# Patient Record
Sex: Female | Born: 1950 | Race: Black or African American | Hispanic: No | Smoking: Former smoker
Health system: Southern US, Community
[De-identification: ages and names within clinical notes are randomized; demographics above are authoritative.]

## PROBLEM LIST (undated history)

## (undated) DIAGNOSIS — M1990 Unspecified osteoarthritis, unspecified site: Secondary | ICD-10-CM

## (undated) DIAGNOSIS — I10 Essential (primary) hypertension: Secondary | ICD-10-CM

## (undated) DIAGNOSIS — E785 Hyperlipidemia, unspecified: Secondary | ICD-10-CM

## (undated) DIAGNOSIS — H269 Unspecified cataract: Secondary | ICD-10-CM

## (undated) DIAGNOSIS — J45909 Unspecified asthma, uncomplicated: Secondary | ICD-10-CM

## (undated) DIAGNOSIS — L910 Hypertrophic scar: Secondary | ICD-10-CM

## (undated) HISTORY — PX: MULTIPLE TOOTH EXTRACTIONS: SHX2053

## (undated) HISTORY — DX: Essential (primary) hypertension: I10

## (undated) HISTORY — DX: Hypertrophic scar: L91.0

## (undated) HISTORY — DX: Hyperlipidemia, unspecified: E78.5

## (undated) HISTORY — PX: BREAST BIOPSY: SHX20

## (undated) HISTORY — PX: ABDOMINAL HYSTERECTOMY: SHX81

## (undated) HISTORY — DX: Unspecified cataract: H26.9

## (undated) HISTORY — PX: KELOID EXCISION: SHX1856

---

## 2005-09-09 ENCOUNTER — Ambulatory Visit: Payer: Self-pay

## 2005-11-12 ENCOUNTER — Emergency Department: Payer: Self-pay | Admitting: Unknown Physician Specialty

## 2009-04-23 ENCOUNTER — Ambulatory Visit: Payer: Self-pay | Admitting: Gastroenterology

## 2009-04-23 LAB — HM COLONOSCOPY: HM COLON: NORMAL

## 2012-02-14 ENCOUNTER — Emergency Department: Payer: Self-pay | Admitting: Emergency Medicine

## 2015-04-08 ENCOUNTER — Encounter: Payer: Self-pay | Admitting: Emergency Medicine

## 2015-04-08 ENCOUNTER — Emergency Department: Payer: Commercial Managed Care - HMO

## 2015-04-08 ENCOUNTER — Emergency Department
Admission: EM | Admit: 2015-04-08 | Discharge: 2015-04-08 | Disposition: A | Discharge status: Home / Self Care | Payer: Commercial Managed Care - HMO | Attending: Emergency Medicine | Admitting: Emergency Medicine

## 2015-04-08 DIAGNOSIS — Y9301 Activity, walking, marching and hiking: Secondary | ICD-10-CM | POA: Insufficient documentation

## 2015-04-08 DIAGNOSIS — X58XXXA Exposure to other specified factors, initial encounter: Secondary | ICD-10-CM | POA: Insufficient documentation

## 2015-04-08 DIAGNOSIS — Z87891 Personal history of nicotine dependence: Secondary | ICD-10-CM | POA: Diagnosis not present

## 2015-04-08 DIAGNOSIS — M7731 Calcaneal spur, right foot: Secondary | ICD-10-CM | POA: Insufficient documentation

## 2015-04-08 DIAGNOSIS — Y998 Other external cause status: Secondary | ICD-10-CM | POA: Diagnosis not present

## 2015-04-08 DIAGNOSIS — Y9289 Other specified places as the place of occurrence of the external cause: Secondary | ICD-10-CM | POA: Insufficient documentation

## 2015-04-08 DIAGNOSIS — S99911A Unspecified injury of right ankle, initial encounter: Secondary | ICD-10-CM | POA: Diagnosis present

## 2015-04-08 DIAGNOSIS — M7730 Calcaneal spur, unspecified foot: Secondary | ICD-10-CM

## 2015-04-08 DIAGNOSIS — M25571 Pain in right ankle and joints of right foot: Secondary | ICD-10-CM | POA: Diagnosis not present

## 2015-04-08 MED ORDER — HYDROCODONE-ACETAMINOPHEN 5-325 MG PO TABS: 1.0000 | ORAL_TABLET | ORAL | Status: DC | PRN

## 2015-04-08 MED ORDER — IBUPROFEN 800 MG PO TABS: 800.0000 mg | ORAL_TABLET | Freq: Three times a day (TID) | ORAL | Status: DC | PRN

## 2015-04-08 NOTE — ED Notes (Signed)
Pt states about a month ago she was walking in [employer] and all the sudden she started having this burning pain states she didn't roll her ankle or anything it "just started to hurt" pt states as long as she is off the foot she doesn't have pain but [place] and it caused the pain to get worse. Pt has slight swelling to in the inside of her right foot and has pain with palp and flexion.

## 2015-04-08 NOTE — Discharge Instructions (Signed)
Heel Spur  A heel spur is a bony growth that forms on the bottom of your heel bone (calcaneus). Heel spurs are common and do not always cause pain. However, heel spurs often cause inflammation in the strong band of tissue that runs underneath the bone of your foot (plantar fascia). When this happens, you may feel pain on the bottom of your foot, near your heel.   CAUSES   The cause of heel spurs is not completely understood. They may be caused by pressure on the heel. Or, they may stem from the muscle attachments (tendons) near the spur pulling on the heel.   RISK FACTORS  You may be at risk for a heel spur if you:  · Are older than 40.  · Are overweight.  · Have wear and tear arthritis (osteoarthritis).  · Have plantar fascia inflammation.  SIGNS AND SYMPTOMS   Some people have heel spurs but no symptoms. If you do have symptoms, they may include:   · Pain in the bottom of your heel.  · Pain that is worse when you first get out of bed.  · Pain that gets worse after walking or standing.  DIAGNOSIS   Your health care provider may diagnose a heel spur based on your symptoms and a physical exam. You may also have an X-ray of your foot to check for a bony growth coming from the calcaneus.   TREATMENT  Treatment aims to relieve the pain from the heel spur. This may include:  · Stretching exercises.  · Losing weight.  · Wearing specific shoes, inserts, or orthotics for comfort and support.  · Wearing splints at night to properly position your feet.  · Taking over-the-counter medicine to relieve pain.  · Being treated with high-intensity sound waves to break up the heel spur (extracorporeal shock wave therapy).  · Getting steroid injections in your heel to reduce swelling and ease pain.  · Having surgery if your heel spur causes long-term (chronic) pain.  HOME CARE INSTRUCTIONS   · Take medicines only as directed by your health care provider.  · Ask your health care provider if you should use ice or cold packs on the  painful areas of your heel or foot.  · Avoid activities that cause you pain until you recover or as directed by your health care provider.  · Stretch before exercising or being physically active.  · Wear supportive shoes that fit well as directed by your health care provider. You might need to buy new shoes. Wearing old shoes or shoes that do not fit correctly may not provide the support that you need.  · Lose weight if your health care provider thinks you should. This can relieve pressure on your foot that may be causing pain and discomfort.  SEEK MEDICAL CARE IF:   · Your pain continues or gets worse.     This information is not intended to replace advice given to you by your health care provider. Make sure you discuss any questions you have with your health care provider.     Document Released: 07/09/2005 Document Revised: 06/23/2014 Document Reviewed: 08/03/2013  Elsevier Interactive Patient Education ©2016 Else[employer] Inc[employer]

## 2015-04-08 NOTE — ED Notes (Signed)
Pt states was walking in [employer] with sudden onset R ankle pain approx 1 mo ago, states [place] and pain has increased.

## 2015-04-08 NOTE — ED Provider Notes (Signed)
[facility] Emergency Department Provider Note  ____________________________________________  Time seen: Approximately 11:29 AM  I have reviewed the triage vital signs and the nursing notes.   HISTORY  Chief Complaint Ankle Injury    HPI Jessica Brooks is a 64 y.o. female who presents for evaluation of right ankle pain 1 month. She reports sudden onset 1 month ago when walking or more. Then exacerbated [place]. Patient denies any trauma. Patient states the pain is. When she is off her foot but increases and exacerbates when walking. Denies any specific trauma.   History reviewed. No pertinent past medical history.  There are no active problems to display for this patient.   Past Surgical History  Procedure Laterality Date  . Abdominal hysterectomy      Current Outpatient Rx  Name  Route  Sig  Dispense  Refill  . HYDROcodone-acetaminophen (NORCO) 5-325 MG tablet   Oral   Take 1-2 tablets by mouth every 4 (four) hours as needed for moderate pain.   15 tablet   0   . ibuprofen (ADVIL,MOTRIN) 800 MG tablet   Oral   Take 1 tablet (800 mg total) by mouth every 8 (eight) hours as needed.   30 tablet   0     Allergies Review of patient's allergies indicates no known allergies.  History reviewed. No pertinent family history.  Social History Social History  Substance Use Topics  . Smoking status: Former Games developermoker  . Smokeless tobacco: Never Used  . Alcohol Use: No    Review of Systems Constitutional: No fever/chills Eyes: No visual changes. ENT: No sore throat. Cardiovascular: Denies chest pain. Respiratory: Denies shortness of breath. Gastrointestinal: No abdominal pain.  No nausea, no vomiting.  No diarrhea.  No constipation. Genitourinary: Negative for dysuria. Musculoskeletal: Positive for right ankle pain. Skin: Negative for rash. Neurological: Negative for headaches, focal weakness or  numbness.  10-point ROS otherwise negative.  ____________________________________________   PHYSICAL EXAM:  VITAL SIGNS: ED Triage Vitals  Enc Vitals Group     BP 04/08/15 1123 143/76 mmHg     Pulse Rate 04/08/15 1123 101     Resp 04/08/15 1123 18     Temp 04/08/15 1123 98 F (36.7 C)     Temp Source 04/08/15 1123 Oral     SpO2 04/08/15 1123 95 %     Weight 04/08/15 1123 280 lb (127.007 kg)     Height 04/08/15 1123 5\' 5"  (1.651 m)     Head Cir --      Peak Flow --      Pain Score 04/08/15 1123 7     Pain Loc --      Pain Edu? --      Excl. in GC? --     Constitutional: Alert and oriented. Well appearing and in no acute distress.  Cardiovascular: Normal rate, regular rhythm. Grossly normal heart sounds.  Good peripheral circulation. Respiratory: Normal respiratory effort.  No retractions. Lungs CTAB. Musculoskeletal: Tender right lower ankle and the lateral aspect.  No joint effusions. Distally neurovascularly intact. With full range of motion. Neurologic:  Normal speech and language. No gross focal neurologic deficits are appreciated. No gait instability. Skin:  Skin is warm, dry and intact. No rash noted. Psychiatric: Mood and affect are normal. Speech and behavior are normal.  ____________________________________________   LABS (all labs ordered are listed, but only abnormal results are displayed)  Labs Reviewed - No data to display ____________________________________________  RADIOLOGY  Positive for plantar and Achilles bone spurs. ____________________________________________   PROCEDURES  Procedure(s) performed: None  Critical Care performed: No  ____________________________________________   INITIAL IMPRESSION / ASSESSMENT AND PLAN / ED COURSE  Pertinent labs & imaging results that were available during my care of the patient were reviewed by me and considered in my medical decision making (see chart for details).  Plantar and Achilles bone spurs.  Rx given for Motrin 800 mg 3 times a day encouraged follow-up with podiatry on call. She voices no other emergency medical complaints at this time. ____________________________________________   FINAL CLINICAL IMPRESSION(S) / ED DIAGNOSES  Final diagnoses:  Inferior calcaneal bone spur    Call schedule  Evangeline Dakin, PA-C 04/08/15 1305  Jennye Moccasin, MD 04/08/15 [phone]

## 2015-08-01 ENCOUNTER — Emergency Department
Admission: EM | Admit: 2015-08-01 | Discharge: 2015-08-01 | Disposition: A | Discharge status: Home / Self Care | Payer: Commercial Managed Care - HMO | Attending: Emergency Medicine | Admitting: Emergency Medicine

## 2015-08-01 ENCOUNTER — Encounter: Payer: Self-pay | Admitting: Emergency Medicine

## 2015-08-01 DIAGNOSIS — Z87891 Personal history of nicotine dependence: Secondary | ICD-10-CM | POA: Diagnosis not present

## 2015-08-01 DIAGNOSIS — R0981 Nasal congestion: Secondary | ICD-10-CM | POA: Diagnosis present

## 2015-08-01 DIAGNOSIS — J069 Acute upper respiratory infection, unspecified: Secondary | ICD-10-CM

## 2015-08-01 MED ORDER — BENZONATATE 100 MG PO CAPS: 200.0000 mg | ORAL_CAPSULE | Freq: Three times a day (TID) | ORAL | Status: DC | PRN

## 2015-08-01 MED ORDER — LORATADINE 10 MG PO TABS: 10.0000 mg | ORAL_TABLET | Freq: Every day | ORAL | Status: DC

## 2015-08-01 MED ORDER — ALBUTEROL SULFATE HFA 108 (90 BASE) MCG/ACT IN AERS: 2.0000 | INHALATION_SPRAY | Freq: Four times a day (QID) | RESPIRATORY_TRACT | Status: DC | PRN

## 2015-08-01 NOTE — ED Provider Notes (Signed)
[facility] Emergency Department Provider Note  ____________________________________________  Time seen: Approximately 11:20 AM  I have reviewed the triage vital signs and the nursing notes.   HISTORY  Chief Complaint Nasal Congestion   HPI Jessica Brooks is a 65 y.o. female to complain of productive cough, runny nose and sneezing for 3 days. Patient states she has not had any fever. She states that last week she had similar symptoms and that it "got better". Now for the last 3 days she has had same symptoms. She has been taking over-the-counter medication with minimal relief. She's been taking Alka-Seltzer, Sudafed, and Robitussin. Patient was former smoker and in the past has had to use an inhaler. She states that she is wheezing at night and first thing in the morning. She has not had an inhaler for the last 2 years. She denies any pain at this time.  History reviewed. No pertinent past medical history.  There are no active problems to display for this patient.   Past Surgical History  Procedure Laterality Date  . Abdominal hysterectomy      Current Outpatient Rx  Name  Route  Sig  Dispense  Refill  . albuterol (PROVENTIL HFA;VENTOLIN HFA) 108 (90 Base) MCG/ACT inhaler   Inhalation   Inhale 2 puffs into the lungs every 6 (six) hours as needed for wheezing or shortness of breath.   1 Inhaler   2   . benzonatate (TESSALON PERLES) 100 MG capsule   Oral   Take 2 capsules (200 mg total) by mouth 3 (three) times daily as needed for cough.   40 capsule   0   . HYDROcodone-acetaminophen (NORCO) 5-325 MG tablet   Oral   Take 1-2 tablets by mouth every 4 (four) hours as needed for moderate pain.   15 tablet   0   . ibuprofen (ADVIL,MOTRIN) 800 MG tablet   Oral   Take 1 tablet (800 mg total) by mouth every 8 (eight) hours as needed.   30 tablet   0   . loratadine (CLARITIN) 10 MG tablet   Oral   Take 1 tablet (10 mg total) by mouth daily.   30 tablet   2     Allergies Review of patient's allergies indicates no known allergies.  History reviewed. No pertinent family history.  Social History Social History  Substance Use Topics  . Smoking status: Former Games developer  . Smokeless tobacco: Never Used  . Alcohol Use: No    Review of Systems Constitutional: No fever/chills Eyes: No visual changes. ENT: Positive nasal congestion. Positive rhinorrhea Cardiovascular: Denies chest pain. Respiratory: Denies shortness of breath. Positive productive cough. Gastrointestinal:  No nausea, no vomiting.  Musculoskeletal: Negative for back pain. Skin: Negative for rash. Neurological: Negative for headaches, focal weakness or numbness. 10-point ROS otherwise negative.  ____________________________________________   PHYSICAL EXAM:  VITAL SIGNS: ED Triage Vitals  Enc Vitals Group     BP 08/01/15 1029 167/79 mmHg     Pulse Rate 08/01/15 1029 102     Resp 08/01/15 1029 20     Temp 08/01/15 1029 98.2 F (36.8 C)     Temp Source 08/01/15 1029 Oral     SpO2 08/01/15 1029 98 %     Weight 08/01/15 1029 200 lb (90.719 kg)     Height 08/01/15 1029  (1.651 m)     Head Cir --      Peak Flow --      Pain Score --  Pain Loc --      Pain Edu? --      Excl. in GC? --     Constitutional: Alert and oriented. Well appearing and in no acute distress. Eyes: Conjunctivae are normal. PERRL. EOMI. Head: Atraumatic. Nose: Moderate congestion/rhinnorhea. EACs are partially occluded with cerumen. TMs bilaterally are dull without erythema. Mouth/Throat: Mucous membranes are moist.  Oropharynx non-erythematous. Posterior drainage noted. Neck: No stridor.   Hematological/Lymphatic/Immunilogical: No cervical lymphadenopathy. Cardiovascular: Normal rate, regular rhythm. Grossly normal heart sounds.  Good peripheral circulation. Respiratory: Normal respiratory effort.  No retractions. Lungs CTAB. Gastrointestinal: Soft and nontender. No  distention.  Musculoskeletal: No lower extremity tenderness nor edema.  No joint effusions. Neurologic:  Normal speech and language. No gross focal neurologic deficits are appreciated. No gait instability. Skin:  Skin is warm, dry and intact. No rash noted. Psychiatric: Mood and affect are normal. Speech and behavior are normal.  ____________________________________________   LABS (all labs ordered are listed, but only abnormal results are displayed)  Labs Reviewed - No data to display  PROCEDURES  Procedure(s) performed: None  Critical Care performed: No  ____________________________________________   INITIAL IMPRESSION / ASSESSMENT AND PLAN / ED COURSE  Pertinent labs & imaging results that were available during my care of the patient were reviewed by me and considered in my medical decision making (see chart for details).  Patient is follow-up with Dr. Christell Faith office if any continued problems. She is placed on Proventil inhaler as needed for wheezing, Tessalon Perles for coughing and Claritin 10 mg one daily for allergy symptoms. She is to increase fluids. Tylenol or ibuprofen if needed. ____________________________________________   FINAL CLINICAL IMPRESSION(S) / ED DIAGNOSES  Final diagnoses:  Acute upper respiratory infection      Tommi Rumps, PA-C 08/01/15 1136  Emily Filbert, MD 08/01/15 1255

## 2015-08-01 NOTE — ED Notes (Signed)
C/o cough and runny nose and sneezing for 3 days.  Denies fevers. C/o nasal congestion

## 2015-08-01 NOTE — Discharge Instructions (Signed)
Upper Respiratory Infection, Adult Most upper respiratory infections (URIs) are caused by a virus. A URI affects the nose, throat, and upper air passages. The most common type of URI is often called "the common cold." HOME CARE   Take medicines only as told by your doctor.  Gargle warm saltwater or take cough drops to comfort your throat as told by your doctor.  Use a warm mist humidifier or inhale steam from a shower to increase air moisture. This may make it easier to breathe.  Drink enough fluid to keep your pee (urine) clear or pale yellow.  Eat soups and other clear broths.  Have a healthy diet.  Rest as needed.  Go back to work when your fever is gone or your doctor says it is okay.  You may need to stay home longer to avoid giving your URI to others.  You can also wear a face mask and wash your hands often to prevent spread of the virus.  Use your inhaler more if you have asthma.  Do not use any tobacco products, including cigarettes, chewing tobacco, or electronic cigarettes. If you need help quitting, ask your doctor. GET HELP IF:  You are getting worse, not better.  Your symptoms are not helped by medicine.  You have chills.  You are getting more short of breath.  You have brown or red mucus.  You have yellow or brown discharge from your nose.  You have pain in your face, especially when you bend forward.  You have a fever.  You have puffy (swollen) neck glands.  You have pain while swallowing.  You have white areas in the back of your throat. GET HELP RIGHT AWAY IF:   You have very bad or constant:  Headache.  Ear pain.  Pain in your forehead, behind your eyes, and over your cheekbones (sinus pain).  Chest pain.  You have long-lasting (chronic) lung disease and any of the following:  Wheezing.  Long-lasting cough.  Coughing up blood.  A change in your usual mucus.  You have a stiff neck.  You have changes in  your:  Vision.  Hearing.  Thinking.  Mood. MAKE SURE YOU:   Understand these instructions.  Will watch your condition.  Will get help right away if you are not doing well or get worse.   This information is not intended to replace advice given to you by your health care provider. Make sure you discuss any questions you have with your health care provider.   Document Released: 11/19/2007 Document Revised: 10/17/2014 Document Reviewed: 09/07/2013 Elsevier Interactive Patient Education [employer]   Follow-up with Dr. Christell Faith office if any continued problems. Begin taking Loratadine one a day, tessalon perles for cough and Proventil inhaler 2 puffs 4 times a day when necessary wheezing. Tylenol if needed for fever or aches. Increase fluids.

## 2015-11-20 ENCOUNTER — Encounter: Payer: Self-pay | Admitting: Unknown Physician Specialty

## 2015-11-20 ENCOUNTER — Ambulatory Visit (INDEPENDENT_AMBULATORY_CARE_PROVIDER_SITE_OTHER): Payer: Commercial Managed Care - HMO | Admitting: Unknown Physician Specialty

## 2015-11-20 VITALS — BP 126/85 | HR 90 | Temp 98.6°F | Ht 65.25 in | Wt 248.8 lb

## 2015-11-20 DIAGNOSIS — Z1231 Encounter for screening mammogram for malignant neoplasm of breast: Secondary | ICD-10-CM

## 2015-11-20 DIAGNOSIS — E2839 Other primary ovarian failure: Secondary | ICD-10-CM

## 2015-11-20 DIAGNOSIS — M76829 Posterior tibial tendinitis, unspecified leg: Secondary | ICD-10-CM | POA: Insufficient documentation

## 2015-11-20 DIAGNOSIS — M76821 Posterior tibial tendinitis, right leg: Secondary | ICD-10-CM

## 2015-11-20 DIAGNOSIS — E669 Obesity, unspecified: Secondary | ICD-10-CM

## 2015-11-20 DIAGNOSIS — J45909 Unspecified asthma, uncomplicated: Secondary | ICD-10-CM | POA: Insufficient documentation

## 2015-11-20 DIAGNOSIS — J4520 Mild intermittent asthma, uncomplicated: Secondary | ICD-10-CM | POA: Diagnosis not present

## 2015-11-20 DIAGNOSIS — Z23 Encounter for immunization: Secondary | ICD-10-CM | POA: Diagnosis not present

## 2015-11-20 DIAGNOSIS — L910 Hypertrophic scar: Secondary | ICD-10-CM | POA: Diagnosis not present

## 2015-11-20 MED ORDER — MUPIROCIN CALCIUM 2 % EX CREA: 1.0000 "application " | TOPICAL_CREAM | Freq: Two times a day (BID) | CUTANEOUS | Status: DC

## 2015-11-20 NOTE — Patient Instructions (Signed)
Please do call to schedule your mammogram and bone density; the number to schedule one at either [facility] or [facility] is [phone]   

## 2015-11-20 NOTE — Progress Notes (Signed)
BP 126/85 mmHg  Pulse 90  Temp(Src) 98.6 F (37 C)  Ht 5' 5.25" (1.657 m)  Wt 248 lb 12.8 oz (112.855 kg)  BMI 41.10 kg/m2  SpO2 96%   Subjective:    Patient ID: Jessica Brooks, female    DOB: Oct 22, 1950, 65 y.o.   MRN: [id number]  HPI: Jessica Brooks is a 65 y.o. female  Chief Complaint  Patient presents with  . Establish Care  . Ankle Pain    Excercising and patient hurt her ankle  . Keloids    Itching  . Other    Needs mammogram and wants shingles vaccine.    Social History   Social History  . Marital Status: Married    Spouse Name: N/A  . Number of Children: N/A  . Years of Education: N/A   Occupational History  . Not on file.   Social History Main Topics  . Smoking status: Former Smoker    Quit date: 06/16/1978  . Smokeless tobacco: Never Used  . Alcohol Use: No  . Drug Use: No  . Sexual Activity: Not on file   Other Topics Concern  . Not on file   Social History Narrative   Past Medical History  Diagnosis Date  . Keloid    Past Surgical History  Procedure Laterality Date  . Abdominal hysterectomy     Family History  Problem Relation Age of Onset  . Diabetes Mother   . Hypertension Mother   . Cancer Father     throat  . Diabetes Sister   . Diabetes Brother     Asthma Has an inhaler that she uses rarely spring and fall.  Uses Loratadine QD.    Keloid Large chest keloid.  States it itches, she itches, scratches it, and it starts draining.   Ankle pain Right medial ankle pain that started when increased exercises.  She started having problems with medial ankle.  Went to the ER and told she had a bone spur.  She states it gets worse the more she walks.    Depression screen [facility] 2/9 11/20/2015 11/20/2015  Decreased Interest 1 1  Down, Depressed, Hopeless 0 0  PHQ - 2 Score 1 1     Relevant past medical, surgical, family and social history reviewed and updated as indicated. Interim medical history since our last visit  reviewed. Allergies and medications reviewed and updated.  Review of Systems  Constitutional: Negative.   HENT: Negative.   Eyes: Negative.   Cardiovascular: Negative.   Gastrointestinal: Negative.   Endocrine: Negative.   Genitourinary: Negative.   Musculoskeletal: Negative.   Psychiatric/Behavioral: Negative.     Per HPI unless specifically indicated above     Objective:    BP 126/85 mmHg  Pulse 90  Temp(Src) 98.6 F (37 C)  Ht 5' 5.25" (1.657 m)  Wt 248 lb 12.8 oz (112.855 kg)  BMI 41.10 kg/m2  SpO2 96%  Wt Readings from Last 3 Encounters:  11/20/15 248 lb 12.8 oz (112.855 kg)  08/01/15 200 lb (90.719 kg)  04/08/15 280 lb (127.007 kg)    Physical Exam  Constitutional: She is oriented to person, place, and time. She appears well-developed and well-nourished. No distress.    HENT:  Head: Normocephalic and atraumatic.  Eyes: Conjunctivae and lids are normal. Right eye exhibits no discharge. Left eye exhibits no discharge. No scleral icterus.  Neck: Normal range of motion. Neck supple. No JVD present. Carotid bruit is not present.  Cardiovascular: Normal rate,  regular rhythm and normal heart sounds.   Pulmonary/Chest: Effort normal and breath sounds normal.  Abdominal: Normal appearance. There is no splenomegaly or hepatomegaly.  Musculoskeletal: Normal range of motion.       Right ankle: She exhibits swelling. She exhibits normal range of motion, no ecchymosis, no deformity, no laceration and normal pulse.       Feet:  Neurological: She is alert and oriented to person, place, and time.  Skin: Skin is warm, dry and intact. No rash noted. No pallor.  Large keloid chest.  There is some drainage at spots.    Psychiatric: She has a normal mood and affect. Her behavior is normal. Judgment and thought content normal.    Results for orders placed or performed in visit on 10/10/15  HM COLONOSCOPY  Result Value Ref Range   HM Colonoscopy Patient Reported Normal See  Report, Patient Reported Normal      Assessment & Plan:   Problem List Items Addressed This Visit      Unprioritized   Asthma    Stable, continue present medications.        Keloid    Bactroban cream.  Refer to dermatology for long-term management      Obesity    Wants to get back to diet.  She plans to give up sweet tea, lemonade.        Tibial tendonitis, posterior    Discussed arch supports.  Will consider PT and/or podiatry referral       Other Visit Diagnoses    Encounter for screening mammogram for breast cancer    -  Primary    Relevant Orders    MM DIGITAL SCREENING BILATERAL    hysterectomy        Relevant Orders    DG Bone Density    Need for shingles vaccine        Relevant Orders    Varicella-zoster vaccine subcutaneous (Completed)    Need for Tdap vaccination        Relevant Orders    Tdap vaccine greater than or equal to 7yo IM (Completed)        Follow up plan: Return for Medicare physical.

## 2015-11-20 NOTE — Assessment & Plan Note (Addendum)
Wants to get back to diet.  She plans to give up sweet tea, lemonade.

## 2015-11-20 NOTE — Assessment & Plan Note (Signed)
Stable, continue present medications.   

## 2015-11-20 NOTE — Assessment & Plan Note (Signed)
Bactroban cream.  Refer to dermatology for long-term management

## 2015-11-20 NOTE — Assessment & Plan Note (Signed)
Discussed arch supports.  Will consider PT and/or podiatry referral

## 2015-12-14 ENCOUNTER — Ambulatory Visit (INDEPENDENT_AMBULATORY_CARE_PROVIDER_SITE_OTHER): Payer: Commercial Managed Care - HMO | Admitting: Unknown Physician Specialty

## 2015-12-14 ENCOUNTER — Encounter: Payer: Self-pay | Admitting: Unknown Physician Specialty

## 2015-12-14 VITALS — BP 140/93 | HR 85 | Temp 98.3°F | Ht 64.3 in | Wt 247.0 lb

## 2015-12-14 DIAGNOSIS — E2839 Other primary ovarian failure: Secondary | ICD-10-CM | POA: Diagnosis not present

## 2015-12-14 DIAGNOSIS — Z0000 Encounter for general adult medical examination without abnormal findings: Secondary | ICD-10-CM | POA: Diagnosis not present

## 2015-12-14 DIAGNOSIS — Z780 Asymptomatic menopausal state: Secondary | ICD-10-CM | POA: Diagnosis not present

## 2015-12-14 DIAGNOSIS — L910 Hypertrophic scar: Secondary | ICD-10-CM

## 2015-12-14 DIAGNOSIS — I10 Essential (primary) hypertension: Secondary | ICD-10-CM | POA: Diagnosis not present

## 2015-12-14 DIAGNOSIS — Z23 Encounter for immunization: Secondary | ICD-10-CM

## 2015-12-14 DIAGNOSIS — Z1231 Encounter for screening mammogram for malignant neoplasm of breast: Secondary | ICD-10-CM

## 2015-12-14 LAB — MICROALBUMIN, URINE WAIVED
Creatinine, Urine Waived: 100 mg/dL (ref 10–300)
MICROALB, UR WAIVED: 30 mg/L — AB (ref 0–19)

## 2015-12-14 NOTE — Patient Instructions (Addendum)
Pneumococcal Conjugate Vaccine (PCV13)  1. Why get vaccinated? Vaccination can protect both children and adults from pneumococcal disease. Pneumococcal disease is caused by bacteria that can spread from person to person through close contact. It can cause ear infections, and it can also lead to more serious infections of the:  Lungs (pneumonia),  Blood (bacteremia), and  Covering of the brain and spinal cord (meningitis). Pneumococcal pneumonia is most common among adults. Pneumococcal meningitis can cause deafness and brain damage, and it kills about 1 child in 10 who get it. Anyone can get pneumococcal disease, but children under 28 years of age and adults 43 years and older, people with certain medical conditions, and cigarette smokers are at the highest risk. Before there was a vaccine, the [place] saw:  more than 700 cases of meningitis,  about 13,000 blood infections,  about 5 million ear infections, and  about 200 deaths in children under 5 each year from pneumococcal disease. Since vaccine became available, severe pneumococcal disease in these children has fallen by 88%. About 18,000 older adults die of pneumococcal disease each year in the [place]. Treatment of pneumococcal infections with penicillin and other drugs is not as effective as it used to be, because some strains of the disease have become resistant to these drugs. This makes prevention of the disease, through vaccination, even more important. 2. PCV13 vaccine Pneumococcal conjugate vaccine (called PCV13) protects against 13 types of pneumococcal bacteria. PCV13 is routinely given to children at 2, 4, 6, and 65-74 months of age. It is also recommended for children and adults 70 to 70 years of age with certain health conditions, and for all adults 64 years of age and older. Your doctor can give you details. 3. Some people should not get this vaccine Anyone who has ever had a life-threatening allergic reaction  to a dose of this vaccine, to an earlier pneumococcal vaccine called PCV7, or to any vaccine containing diphtheria toxoid (for example, DTaP), should not get PCV13. Anyone with a severe allergy to any component of PCV13 should not get the vaccine. Tell your doctor if the person being vaccinated has any severe allergies. If the person scheduled for vaccination is not feeling well, your healthcare provider might decide to reschedule the shot on another day. 4. Risks of a vaccine reaction With any medicine, including vaccines, there is a chance of reactions. These are usually mild and go away on their own, but serious reactions are also possible. Problems reported following PCV13 varied by age and dose in the series. The most common problems reported among children were:  About half became drowsy after the shot, had a temporary loss of appetite, or had redness or tenderness where the shot was given.  About 1 out of 3 had swelling where the shot was given.  About 1 out of 3 had a mild fever, and about 1 in 20 had a fever over 102.55F.  Up to about 8 out of 10 became fussy or irritable. Adults have reported pain, redness, and swelling where the shot was given; also mild fever, fatigue, headache, chills, or muscle pain. Young children who get PCV13 along with inactivated flu vaccine at the same time may be at increased risk for seizures caused by fever. Ask your doctor for more information. Problems that could happen after any vaccine:  People sometimes faint after a medical procedure, including vaccination. Sitting or lying down for about 15 minutes can help prevent fainting, and injuries caused by a fall.  Tell your doctor if you feel dizzy, or have vision changes or ringing in the ears.  Some older children and adults get severe pain in the shoulder and have difficulty moving the arm where a shot was given. This happens very rarely.  Any medication can cause a severe allergic reaction. Such  reactions from a vaccine are very rare, estimated at about 1 in a million doses, and would happen within a few minutes to a few hours after the vaccination. As with any medicine, there is a very small chance of a vaccine causing a serious injury or death. The safety of vaccines is always being monitored. For more information, visit: http://floyd.org/ 5. What if there is a serious reaction? What should I look for?  Look for anything that concerns you, such as signs of a severe allergic reaction, very high fever, or unusual behavior. Signs of a severe allergic reaction can include hives, swelling of the face and throat, difficulty breathing, a fast heartbeat, dizziness, and weakness-usually within a few minutes to a few hours after the vaccination. What should I do?  If you think it is a severe allergic reaction or other emergency that can't wait, call 9-1-1 or get the person to the nearest hospital. Otherwise, call your doctor. Reactions should be reported to the Vaccine Adverse Event Reporting System (VAERS). Your doctor should file this report, or you can do it yourself through the VAERS web site at www.vaers.LAgents.no, or by calling 1-202-701-4372. VAERS does not give medical advice. 6. The National Vaccine Injury Compensation Program The [employer] Vaccine Injury Compensation Program (VICP) is a federal program that was created to compensate people who may have been injured by certain vaccines. Persons who believe they may have been injured by a vaccine can learn about the program and about filing a claim by calling 1-919-065-0496 or visiting the VICP website at SpiritualWord.at. There is a time limit to file a claim for compensation. 7. How can I learn more?  Ask your healthcare provider. He or she can give you the vaccine package insert or suggest other sources of information.  Call your local or state health department.  Contact the Centers for Disease Control and  Prevention (CDC):  Call 435-357-6703 (1-800-CDC-INFO) or  Visit CDC's website at PicCapture.uy Vaccine Information Statement PCV13 Vaccine (04/20/2014)   This information is not intended to replace advice given to you by your health care provider. Make sure you discuss any questions you have with your health care provider.  ---------------------------------------------------------------------------------------------------------------------------- Please do call to schedule your mammogram; the number to schedule one at either [facility] or [facility] is [phone] ----------------------------------------------------------------------------------------------------------------------------  DASH Eating Plan DASH stands for "Dietary Approaches to Stop Hypertension." The DASH eating plan is a healthy eating plan that has been shown to reduce high blood pressure (hypertension). Additional health benefits may include reducing the risk of type 2 diabetes mellitus, heart disease, and stroke. The DASH eating plan may also help with weight loss. WHAT DO I NEED TO KNOW ABOUT THE DASH EATING PLAN? For the DASH eating plan, you will follow these general guidelines:  Choose foods with a percent daily value for sodium of less than 5% (as listed on the food label).  Use salt-free seasonings or herbs instead of table salt or sea salt.  Check with your health care provider or pharmacist before using salt substitutes.  Eat lower-sodium products, often labeled as "lower sodium" or "no salt added."  Eat fresh foods.  Eat more vegetables,  fruits, and low-fat dairy products.  Choose whole grains. Look for the word "whole" as the first word in the ingredient list.  Choose fish and skinless chicken or Mal[place]urkey more often than red meat. Limit fish, poultry, and meat to 6 oz (170 g) each day.  Limit sweets, desserts, sugars, and sugary drinks.  Choose heart-healthy  fats.  Limit cheese to 1 oz (28 g) per day.  Eat more home-cooked food and less restaurant, buffet, and fast food.  Limit fried foods.  Cook foods using methods other than frying.  Limit canned vegetables. If you do use them, rinse them well to decrease the sodium.  When eating at a restaurant, ask that your food be prepared with less salt, or no salt if possible. WHAT FOODS CAN I EAT? Seek help from a dietitian for individual calorie needs. Grains Whole grain or whole wheat bread. Brown rice. Whole grain or whole wheat pasta. Quinoa, bulgur, and whole grain cereals. Low-sodium cereals. Corn or whole wheat flour tortillas. Whole grain cornbread. Whole grain crackers. Low-sodium crackers. Vegetables Fresh or frozen vegetables (raw, steamed, roasted, or grilled). Low-sodium or reduced-sodium tomato and vegetable juices. Low-sodium or reduced-sodium tomato sauce and paste. Low-sodium or reduced-sodium canned vegetables.  Fruits All fresh, canned (in natural juice), or frozen fruits. Meat and Other Protein Products Ground beef (85% or leaner), grass-fed beef, or beef trimmed of fat. Skinless chicken or Mal[place]urkey. Ground chicken or Malawiturkey. Pork trimmed of fat. All fish and seafood. Eggs. Dried beans, peas, or lentils. Unsalted nuts and seeds. Unsalted canned beans. Dairy Low-fat dairy products, such as skim or 1% milk, 2% or reduced-fat cheeses, low-fat ricotta or cottage cheese, or plain low-fat yogurt. Low-sodium or reduced-sodium cheeses. Fats and Oils Tub margarines without trans fats. Light or reduced-fat mayonnaise and salad dressings (reduced sodium). Avocado. Safflower, olive, or canola oils. Natural peanut or almond butter. Other Unsalted popcorn and pretzels. The items listed above may not be a complete list of recommended foods or beverages. Contact your dietitian for more options. WHAT FOODS ARE NOT RECOMMENDED? Grains White bread. White pasta. White rice. Refined cornbread.  Bagels and croissants. Crackers that contain trans fat. Vegetables Creamed or fried vegetables. Vegetables in a cheese sauce. Regular canned vegetables. Regular canned tomato sauce and paste. Regular tomato and vegetable juices. Fruits Dried fruits. Canned fruit in light or heavy syrup. Fruit juice. Meat and Other Protein Products Fatty cuts of meat. Ribs, chicken wings, bacon, sausage, bologna, salami, chitterlings, fatback, hot dogs, bratwurst, and packaged luncheon meats. Salted nuts and seeds. Canned beans with salt. Dairy Whole or 2% milk, cream, half-and-half, and cream cheese. Whole-fat or sweetened yogurt. Full-fat cheeses or blue cheese. Nondairy creamers and whipped toppings. Processed cheese, cheese spreads, or cheese curds. Condiments Onion and garlic salt, seasoned salt, table salt, and sea salt. Canned and packaged gravies. Worcestershire sauce. Tartar sauce. Barbecue sauce. Teriyaki sauce. Soy sauce, including reduced sodium. Steak sauce. Fish sauce. Oyster sauce. Cocktail sauce. Horseradish. Ketchup and mustard. Meat flavorings and tenderizers. Bouillon cubes. Hot sauce. Tabasco sauce. Marinades. Taco seasonings. Relishes. Fats and Oils Butter, stick margarine, lard, shortening, ghee, and bacon fat. Coconut, palm kernel, or palm oils. Regular salad dressings. Other Pickles and olives. Salted popcorn and pretzels. The items listed above may not be a complete list of foods and beverages to avoid. Contact your dietitian for more information. WHERE CAN I FIND MORE INFORMATION? National Heart, Lung, and Blood Institute: CablePromo.itwww.nhlbi.nih.gov/health/health-topics/topics/dash/   This information is not intended to replace advice  given to you by your health care provider. Make sure you discuss any questions you have with your health care provider.   Document Released: 05/22/2011 Document Revised: 06/23/2014 Document Reviewed: 04/06/2013 Elsevier Interactive Patient Education [employer]2016 Elsevier  Inc.

## 2015-12-14 NOTE — Progress Notes (Signed)
BP 140/93 mmHg  Pulse 85  Temp(Src) 98.3 F (36.8 C)  Ht 5' 4.3" (1.633 m)  Wt 247 lb (112.038 kg)  BMI 42.01 kg/m2  SpO2 95%   Subjective:    Patient ID: Jessica Brooks, female    DOB: Jan 13, 1951, 65 y.o.   MRN: [id number]  HPI: Jessica Brooks is a 65 y.o. female  Chief Complaint  Patient presents with  . Medicare Wellness   [facility] 12/14/2015 11/20/2015 11/20/2015  Decreased Interest 0 1 1  Down, Depressed, Hopeless 0 0 0  PHQ - 2 Score 0 1 1    Fall Risk  12/14/2015 11/20/2015  Falls in the past year? No No   Family History  Problem Relation Age of Onset  . Diabetes Mother   . Hypertension Mother   . Cancer Father     throat  . Diabetes Sister   . Kidney disease Sister   . Diabetes Brother   . Kidney disease Brother    Past Medical History  Diagnosis Date  . Keloid    Past Surgical History  Procedure Laterality Date  . Abdominal hysterectomy    . Multiple tooth extractions     Social History   Social History  . Marital Status: Married    Spouse Name: N/A  . Number of Children: N/A  . Years of Education: N/A   Occupational History  . Not on file.   Social History Main Topics  . Smoking status: Former Smoker    Quit date: 06/16/1978  . Smokeless tobacco: Never Used  . Alcohol Use: No  . Drug Use: No  . Sexual Activity: No   Other Topics Concern  . Not on file   Social History Narrative     Relevant past medical, surgical, family and social history reviewed and updated as indicated. Interim medical history since our last visit reviewed. Allergies and medications reviewed and updated.  Review of Systems  Constitutional: Negative.   HENT: Negative.   Eyes: Negative.   Respiratory: Negative.   Cardiovascular: Negative.   Gastrointestinal: Negative.   Endocrine: Negative.   Genitourinary: Negative.   Musculoskeletal: Negative.   Skin: Negative.   Allergic/Immunologic: Negative.   Neurological: Negative.    Hematological: Negative.   Psychiatric/Behavioral: Negative.     Per HPI unless specifically indicated above     Objective:    BP 140/93 mmHg  Pulse 85  Temp(Src) 98.3 F (36.8 C)  Ht 5' 4.3" (1.633 m)  Wt 247 lb (112.038 kg)  BMI 42.01 kg/m2  SpO2 95%  Wt Readings from Last 3 Encounters:  12/14/15 247 lb (112.038 kg)  11/20/15 248 lb 12.8 oz (112.855 kg)  08/01/15 200 lb (90.719 kg)    Physical Exam  Constitutional: She is oriented to person, place, and time. She appears well-developed and well-nourished.  HENT:  Head: Normocephalic and atraumatic.  Eyes: Pupils are equal, round, and reactive to light. Right eye exhibits no discharge. Left eye exhibits no discharge. No scleral icterus.  Neck: Normal range of motion. Neck supple. Carotid bruit is not present. No thyromegaly present.  Cardiovascular: Normal rate, regular rhythm and normal heart sounds.  Exam reveals no gallop and no friction rub.   No murmur heard. Pulmonary/Chest: Effort normal and breath sounds normal. No respiratory distress. She has no wheezes. She has no rales.  Abdominal: Soft. Bowel sounds are normal. There is no tenderness. There is no rebound.  Genitourinary: No breast swelling, tenderness or discharge.  Musculoskeletal: Normal range of motion.  Lymphadenopathy:    She has no cervical adenopathy.  Neurological: She is alert and oriented to person, place, and time.  Skin: Skin is warm, dry and intact. No rash noted.  Large keloid on chest that is fragile and opens in areas  Psychiatric: She has a normal mood and affect. Her speech is normal and behavior is normal. Judgment and thought content normal. Cognition and memory are normal.    Results for orders placed or performed in visit on 10/10/15  HM COLONOSCOPY  Result Value Ref Range   HM Colonoscopy Patient Reported Normal See Report, Patient Reported Normal      Assessment & Plan:   Problem List Items Addressed This Visit       Unprioritized   Hypertension    Dash diet.  Check CMP and lipid panel      Relevant Orders   Comprehensive metabolic panel   Lipid Panel w/o Chol/HDL Ratio   Microalbumin, Urine Waived   Keloid   Relevant Orders   Ambulatory referral to Plastic Surgery    Other Visit Diagnoses    Need for pneumococcal vaccination    -  Primary    Relevant Orders    Pneumococcal conjugate vaccine 13-valent IM (Completed)    Health care maintenance        Relevant Orders    Hepatitis C antibody    Menopause        Encounter for screening mammogram for breast cancer        hysterectomy            Follow up plan: Return in about 6 months (around 06/14/2016).

## 2015-12-14 NOTE — Assessment & Plan Note (Signed)
Dash diet.  Check CMP and lipid panel

## 2015-12-15 LAB — COMPREHENSIVE METABOLIC PANEL
A/G RATIO: 1.3 (ref 1.2–2.2)
ALBUMIN: 3.9 g/dL (ref 3.6–4.8)
ALK PHOS: 66 IU/L (ref 39–117)
ALT: 14 IU/L (ref 0–32)
AST: 11 IU/L (ref 0–40)
BUN / CREAT RATIO: 15 (ref 12–28)
BUN: 9 mg/dL (ref 8–27)
Bilirubin Total: 0.3 mg/dL (ref 0.0–1.2)
CO2: 25 mmol/L (ref 18–29)
CREATININE: 0.6 mg/dL (ref 0.57–1.00)
Calcium: 9.4 mg/dL (ref 8.7–10.3)
Chloride: 102 mmol/L (ref 96–106)
GFR calc Af Amer: 110 mL/min/{1.73_m2} (ref >59)
GFR calc non Af Amer: 95 mL/min/{1.73_m2} (ref >59)
GLOBULIN, TOTAL: 3.1 g/dL (ref 1.5–4.5)
Glucose: 90 mg/dL (ref 65–99)
POTASSIUM: 4.6 mmol/L (ref 3.5–5.2)
SODIUM: 143 mmol/L (ref 134–144)
Total Protein: 7 g/dL (ref 6.0–8.5)

## 2015-12-15 LAB — HEPATITIS C ANTIBODY: Hep C Virus Ab: <0.1 s/co ratio (ref 0.0–0.9)

## 2015-12-15 LAB — LIPID PANEL W/O CHOL/HDL RATIO
CHOLESTEROL TOTAL: 170 mg/dL (ref 100–199)
HDL: 62 mg/dL (ref >39)
LDL CALC: 93 mg/dL (ref 0–99)
Triglycerides: 76 mg/dL (ref 0–149)
VLDL Cholesterol Cal: 15 mg/dL (ref 5–40)

## 2015-12-17 ENCOUNTER — Encounter: Payer: Self-pay | Admitting: Unknown Physician Specialty

## 2015-12-17 NOTE — Progress Notes (Signed)
Quick Note:  Normal labs. Patient notified by letter. ______ 

## 2016-02-12 ENCOUNTER — Telehealth: Payer: Self-pay

## 2016-02-12 NOTE — Telephone Encounter (Signed)
Called and spoke to patient. I let her know that Jessica MaxwellCheryl said she was overdue for her mammogram and bone density. I provided the patient with [facility] phone number. Patient asked about a cough she was having. I suggested that she come in and see us to see if she may need an antibiotic. Patient did not want to schedule and stated she would call us back if she got worse.

## 2016-02-12 NOTE — Telephone Encounter (Signed)
-----   Message from Gabriel Cirriheryl Wicker, NP sent at 02/08/2016  4:43 PM EDT ----- Please let her know she needs her mammogram and bone density ----- Message ----- From: SYSTEM Sent: 12/19/2015  12:04 AM To: Gabriel Cirriheryl Wicker, NP

## 2016-03-12 ENCOUNTER — Telehealth: Payer: Self-pay

## 2016-03-12 NOTE — Telephone Encounter (Signed)
-----   Message from Gabriel Cirriheryl Wicker, NP sent at 03/10/2016  1:17 PM EDT ----- Also her bone density ----- Message ----- From: SYSTEM Sent: 12/19/2015  12:04 AM To: Gabriel Cirriheryl Wicker, NP

## 2016-03-12 NOTE — Telephone Encounter (Signed)
Called and left patient a VM asking for her to please return my call.  

## 2016-03-13 NOTE — Telephone Encounter (Signed)
Tried calling patient's home number. No VM available so I will try to call again later.

## 2016-03-14 NOTE — Telephone Encounter (Signed)
Called and spoke to patient. She stated that she had recently gone through some hard times but that she had just thought about this today. [facility] and I provided [facility] to her.

## 2016-04-14 ENCOUNTER — Other Ambulatory Visit: Payer: Self-pay | Admitting: Unknown Physician Specialty

## 2016-04-14 ENCOUNTER — Ambulatory Visit
Admission: RE | Admit: 2016-04-14 | Discharge: 2016-04-14 | Disposition: A | Discharge status: Home / Self Care | Payer: Commercial Managed Care - HMO | Source: Ambulatory Visit | Attending: Unknown Physician Specialty | Admitting: Unknown Physician Specialty

## 2016-04-14 ENCOUNTER — Encounter: Payer: Self-pay | Admitting: Unknown Physician Specialty

## 2016-04-14 DIAGNOSIS — Z13820 Encounter for screening for osteoporosis: Secondary | ICD-10-CM | POA: Insufficient documentation

## 2016-04-14 DIAGNOSIS — Z1231 Encounter for screening mammogram for malignant neoplasm of breast: Secondary | ICD-10-CM

## 2016-04-14 DIAGNOSIS — E2839 Other primary ovarian failure: Secondary | ICD-10-CM | POA: Insufficient documentation

## 2016-04-14 DIAGNOSIS — M8588 Other specified disorders of bone density and structure, other site: Secondary | ICD-10-CM | POA: Diagnosis not present

## 2016-04-14 DIAGNOSIS — M85851 Other specified disorders of bone density and structure, right thigh: Secondary | ICD-10-CM | POA: Diagnosis not present

## 2016-06-18 ENCOUNTER — Ambulatory Visit: Payer: Commercial Managed Care - HMO | Admitting: Unknown Physician Specialty

## 2016-06-23 ENCOUNTER — Ambulatory Visit: Payer: Commercial Managed Care - HMO | Admitting: Unknown Physician Specialty

## 2016-06-24 DIAGNOSIS — R69 Illness, unspecified: Secondary | ICD-10-CM | POA: Diagnosis not present

## 2016-06-30 DIAGNOSIS — R69 Illness, unspecified: Secondary | ICD-10-CM | POA: Diagnosis not present

## 2016-07-01 ENCOUNTER — Ambulatory Visit (INDEPENDENT_AMBULATORY_CARE_PROVIDER_SITE_OTHER): Payer: Commercial Managed Care - HMO | Admitting: Unknown Physician Specialty

## 2016-07-01 ENCOUNTER — Encounter: Payer: Self-pay | Admitting: Unknown Physician Specialty

## 2016-07-01 DIAGNOSIS — I10 Essential (primary) hypertension: Secondary | ICD-10-CM

## 2016-07-01 NOTE — Assessment & Plan Note (Signed)
Plan to work on diet and exercise.

## 2016-07-01 NOTE — Assessment & Plan Note (Signed)
Normotensive today.

## 2016-07-01 NOTE — Progress Notes (Signed)
BP 130/83 (BP Location: Left Arm, Cuff Size: Large)   Pulse 91   Temp 97.7 F (36.5 C)   Ht 5' 4.6" (1.641 m)   Wt 252 lb (114.3 kg)   SpO2 97%   BMI 42.46 kg/m    Subjective:    Patient ID: Jessica Brooks, female    DOB: Mar 01, 1951, 66 y.o.   MRN: [id number]  HPI: Jessica Brooks is a 66 y.o. female  Chief Complaint  Patient presents with  . Hypertension    6 month f/up   F/u an elevated BP from last visit.  Today her BP is OK.  She has been working on diet changes but would like to lose weight.    Relevant past medical, surgical, family and social history reviewed and updated as indicated. Interim medical history since our last visit reviewed. Allergies and medications reviewed and updated.  Review of Systems  Per HPI unless specifically indicated above     Objective:    BP 130/83 (BP Location: Left Arm, Cuff Size: Large)   Pulse 91   Temp 97.7 F (36.5 C)   Ht 5' 4.6" (1.641 m)   Wt 252 lb (114.3 kg)   SpO2 97%   BMI 42.46 kg/m   Wt Readings from Last 3 Encounters:  07/01/16 252 lb (114.3 kg)  12/14/15 247 lb (112 kg)  11/20/15 248 lb 12.8 oz (112.9 kg)    Physical Exam  Constitutional: She is oriented to person, place, and time. She appears well-developed and well-nourished. No distress.  HENT:  Head: Normocephalic and atraumatic.  Eyes: Conjunctivae and lids are normal. Right eye exhibits no discharge. Left eye exhibits no discharge. No scleral icterus.  Neck: Normal range of motion. Neck supple. No JVD present. Carotid bruit is not present.  Cardiovascular: Normal rate, regular rhythm and normal heart sounds.   Pulmonary/Chest: Effort normal and breath sounds normal.  Abdominal: Normal appearance. There is no splenomegaly or hepatomegaly.  Musculoskeletal: Normal range of motion.  Neurological: She is alert and oriented to person, place, and time.  Skin: Skin is warm, dry and intact. No rash noted. No pallor.  Psychiatric: She has a  normal mood and affect. Her behavior is normal. Judgment and thought content normal.    Results for orders placed or performed in visit on 12/14/15  Hepatitis C antibody  Result Value Ref Range   Hep C Virus Ab <0.1 0.0 - 0.9 s/co ratio  Comprehensive metabolic panel  Result Value Ref Range   Glucose 90 65 - 99 mg/dL   BUN 9 8 - 27 mg/dL   Creatinine, Ser 4.09 0.57 - 1.00 mg/dL   GFR calc non Af Amer 95 >59 mL/min/1.73   GFR calc Af Amer 110 >59 mL/min/1.73   BUN/Creatinine Ratio 15 12 - 28   Sodium 143 134 - 144 mmol/L   Potassium 4.6 3.5 - 5.2 mmol/L   Chloride 102 96 - 106 mmol/L   CO2 25 18 - 29 mmol/L   Calcium 9.4 8.7 - 10.3 mg/dL   Total Protein 7.0 6.0 - 8.5 g/dL   Albumin 3.9 3.6 - 4.8 g/dL   Globulin, Total 3.1 1.5 - 4.5 g/dL   Albumin/Globulin Ratio 1.3 1.2 - 2.2   Bilirubin Total 0.3 0.0 - 1.2 mg/dL   Alkaline Phosphatase 66 39 - 117 IU/L   AST 11 0 - 40 IU/L   ALT 14 0 - 32 IU/L  Lipid Panel w/o Chol/HDL Ratio  Result Value Ref  Range   Cholesterol, Total 170 100 - 199 mg/dL   Triglycerides 76 0 - 149 mg/dL   HDL 62 >16>39 mg/dL   VLDL Cholesterol Cal 15 5 - 40 mg/dL   LDL Calculated 93 0 - 99 mg/dL  Microalbumin, Urine Waived  Result Value Ref Range   Microalb, Ur Waived 30 (H) 0 - 19 mg/L   Creatinine, Urine Waived 100 10 - 300 mg/dL   Microalb/Creat Ratio 30-300 (H) <30 mg/g      Assessment & Plan:   Problem List Items Addressed This Visit      Unprioritized   Hypertension    Normotensive today          Follow up plan: Return in about 6 months (around 12/29/2016) for physical.

## 2016-07-15 ENCOUNTER — Telehealth: Payer: Self-pay | Admitting: Unknown Physician Specialty

## 2016-07-15 NOTE — Telephone Encounter (Signed)
I did not

## 2016-07-15 NOTE — Telephone Encounter (Signed)
Patient states that she missed a call from this number.  Patient wondered if you called her around 2pm today?

## 2016-09-09 ENCOUNTER — Encounter: Payer: Self-pay | Admitting: Unknown Physician Specialty

## 2016-09-09 ENCOUNTER — Ambulatory Visit (INDEPENDENT_AMBULATORY_CARE_PROVIDER_SITE_OTHER): Payer: Medicare HMO | Admitting: Unknown Physician Specialty

## 2016-09-09 DIAGNOSIS — G8929 Other chronic pain: Secondary | ICD-10-CM | POA: Diagnosis not present

## 2016-09-09 DIAGNOSIS — M25562 Pain in left knee: Secondary | ICD-10-CM | POA: Diagnosis not present

## 2016-09-09 DIAGNOSIS — M25561 Pain in right knee: Secondary | ICD-10-CM | POA: Insufficient documentation

## 2016-09-09 MED ORDER — TRAMADOL HCL 50 MG PO TABS: 50.0000 mg | ORAL_TABLET | Freq: Three times a day (TID) | ORAL | 0 refills | Status: DC | PRN

## 2016-09-09 MED ORDER — MUPIROCIN CALCIUM 2 % EX CREA: 1.0000 "application " | TOPICAL_CREAM | Freq: Two times a day (BID) | CUTANEOUS | 1 refills | Status: DC

## 2016-09-09 MED ORDER — MELOXICAM 15 MG PO TABS: 15.0000 mg | ORAL_TABLET | Freq: Every day | ORAL | 0 refills | Status: DC

## 2016-09-09 NOTE — Assessment & Plan Note (Addendum)
Get x-ray.  Schedule for injection.  Rx for Mobic and Tramadol.  RTC here for injection.

## 2016-09-09 NOTE — Progress Notes (Signed)
BP (!) 146/86 (BP Location: Left Arm, Patient Position: Sitting, Cuff Size: Large)   Pulse 84   Temp 97.7 F (36.5 C)   Wt 255 lb 6.4 oz (115.8 kg)   SpO2 96%   BMI 43.03 kg/m    Subjective:    Patient ID: Jessica Brooks, female    DOB: Mar 18, 1951, 66 y.o.   MRN: [id number]  HPI: Jessica Brooks is a 66 y.o. female  Chief Complaint  Patient presents with  . Knee Pain    pt states that her left knee has been hurting for months now   Knee Pain   Incident onset: years but worse over the last month.  No specific injury. There was no injury mechanism. The pain is present in the left knee. The quality of the pain is described as aching. The pain is at a severity of 8/10. The pain has been fluctuating since onset. Associated symptoms include a loss of motion. Pertinent negatives include no inability to bear weight, loss of sensation, muscle weakness, numbness or tingling. She reports no foreign bodies present. Exacerbated by: night is the worse when she needs to get up to use the bathroom. Treatments tried: arthritis pill but doesn't know what kind.  Also tried Motrin and Exedrine without benefit. The treatment provided no relief.   Relevant past medical, surgical, family and social history reviewed and updated as indicated. Interim medical history since our last visit reviewed. Allergies and medications reviewed and updated.  Review of Systems  Neurological: Negative for tingling and numbness.    Per HPI unless specifically indicated above     Objective:    BP (!) 146/86 (BP Location: Left Arm, Patient Position: Sitting, Cuff Size: Large)   Pulse 84   Temp 97.7 F (36.5 C)   Wt 255 lb 6.4 oz (115.8 kg)   SpO2 96%   BMI 43.03 kg/m   Wt Readings from Last 3 Encounters:  09/09/16 255 lb 6.4 oz (115.8 kg)  07/01/16 252 lb (114.3 kg)  12/14/15 247 lb (112 kg)    Physical Exam  Constitutional: She is oriented to person, place, and time. She appears well-developed  and well-nourished. No distress.  HENT:  Head: Normocephalic and atraumatic.  Eyes: Conjunctivae and lids are normal. Right eye exhibits no discharge. Left eye exhibits no discharge. No scleral icterus.  Neck: Normal range of motion. Neck supple. No JVD present. Carotid bruit is not present.  Cardiovascular: Normal rate, regular rhythm and normal heart sounds.   Pulmonary/Chest: Effort normal and breath sounds normal.  Abdominal: Normal appearance. There is no splenomegaly or hepatomegaly.  Musculoskeletal:       Right knee: She exhibits decreased range of motion and swelling. She exhibits no erythema, no LCL laxity, no bony tenderness and no MCL laxity. No tenderness found. No medial joint line tenderness noted.  Neurological: She is alert and oriented to person, place, and time.  Skin: Skin is warm, dry and intact. No rash noted. No pallor.  Psychiatric: She has a normal mood and affect. Her behavior is normal. Judgment and thought content normal.    Results for orders placed or performed in visit on 12/14/15  Hepatitis C antibody  Result Value Ref Range   Hep C Virus Ab <0.1 0.0 - 0.9 s/co ratio  Comprehensive metabolic panel  Result Value Ref Range   Glucose 90 65 - 99 mg/dL   BUN 9 8 - 27 mg/dL   Creatinine, Ser 4.09 0.57 - 1.00 mg/dL  GFR calc non Af Amer 95 >59 mL/min/1.73   GFR calc Af Amer 110 >59 mL/min/1.73   BUN/Creatinine Ratio 15 12 - 28   Sodium 143 134 - 144 mmol/L   Potassium 4.6 3.5 - 5.2 mmol/L   Chloride 102 96 - 106 mmol/L   CO2 25 18 - 29 mmol/L   Calcium 9.4 8.7 - 10.3 mg/dL   Total Protein 7.0 6.0 - 8.5 g/dL   Albumin 3.9 3.6 - 4.8 g/dL   Globulin, Total 3.1 1.5 - 4.5 g/dL   Albumin/Globulin Ratio 1.3 1.2 - 2.2   Bilirubin Total 0.3 0.0 - 1.2 mg/dL   Alkaline Phosphatase 66 39 - 117 IU/L   AST 11 0 - 40 IU/L   ALT 14 0 - 32 IU/L  Lipid Panel w/o Chol/HDL Ratio  Result Value Ref Range   Cholesterol, Total 170 100 - 199 mg/dL   Triglycerides 76 0 -  149 mg/dL   HDL 62 >16>39 mg/dL   VLDL Cholesterol Cal 15 5 - 40 mg/dL   LDL Calculated 93 0 - 99 mg/dL  Microalbumin, Urine Waived  Result Value Ref Range   Microalb, Ur Waived 30 (H) 0 - 19 mg/L   Creatinine, Urine Waived 100 10 - 300 mg/dL   Microalb/Creat Ratio 30-300 (H) <30 mg/g      Assessment & Plan:   Problem List Items Addressed This Visit      Unprioritized   Left knee pain    Get x-ray.  Schedule for injection.  Rx for Mobic and Tramadol.  RTC here for injection.        Relevant Orders   DG Knee Complete 4 Views Left       Follow up plan: Return for RTC for knee injection.

## 2016-09-10 ENCOUNTER — Telehealth: Payer: Self-pay | Admitting: Unknown Physician Specialty

## 2016-09-10 NOTE — Telephone Encounter (Signed)
Patient said [facility] radiology said Elnita MaxwellCheryl would need to schedule this xray appt.  Please advise.  Thanks

## 2016-09-10 NOTE — Telephone Encounter (Signed)
Called and spoke to patient. She stated that [facility] and that they told her that her doctor's office needed to make her an appointment to have an x-ray done. I explained to the patient that usually for x-rays, patient's just got to [facility] to have them done. I told patient where that office was located and she said that she would go down there. She asked if she needed an appointment to go down there and I told her that I did not believe she did to have an x-ray done. Called [facility] and confirmed this information.

## 2016-09-23 ENCOUNTER — Ambulatory Visit
Admission: RE | Admit: 2016-09-23 | Discharge: 2016-09-23 | Disposition: A | Discharge status: Home / Self Care | Payer: Medicare HMO | Source: Ambulatory Visit | Attending: Unknown Physician Specialty | Admitting: Unknown Physician Specialty

## 2016-09-23 DIAGNOSIS — M25562 Pain in left knee: Secondary | ICD-10-CM | POA: Diagnosis not present

## 2016-09-23 DIAGNOSIS — R6889 Other general symptoms and signs: Secondary | ICD-10-CM | POA: Diagnosis not present

## 2016-09-23 DIAGNOSIS — M1712 Unilateral primary osteoarthritis, left knee: Secondary | ICD-10-CM | POA: Insufficient documentation

## 2016-09-23 DIAGNOSIS — M19072 Primary osteoarthritis, left ankle and foot: Secondary | ICD-10-CM | POA: Diagnosis not present

## 2016-09-23 DIAGNOSIS — G8929 Other chronic pain: Secondary | ICD-10-CM | POA: Diagnosis not present

## 2016-09-24 ENCOUNTER — Encounter: Payer: Self-pay | Admitting: Unknown Physician Specialty

## 2016-10-01 ENCOUNTER — Encounter: Payer: Self-pay | Admitting: Unknown Physician Specialty

## 2016-10-01 ENCOUNTER — Ambulatory Visit (INDEPENDENT_AMBULATORY_CARE_PROVIDER_SITE_OTHER): Payer: Medicare HMO | Admitting: Unknown Physician Specialty

## 2016-10-01 VITALS — BP 139/87 | HR 96 | Temp 98.4°F | Wt 252.4 lb

## 2016-10-01 DIAGNOSIS — R6889 Other general symptoms and signs: Secondary | ICD-10-CM | POA: Diagnosis not present

## 2016-10-01 DIAGNOSIS — R059 Cough, unspecified: Secondary | ICD-10-CM | POA: Insufficient documentation

## 2016-10-01 DIAGNOSIS — J329 Chronic sinusitis, unspecified: Secondary | ICD-10-CM | POA: Insufficient documentation

## 2016-10-01 DIAGNOSIS — R05 Cough: Secondary | ICD-10-CM

## 2016-10-01 DIAGNOSIS — M25562 Pain in left knee: Secondary | ICD-10-CM | POA: Diagnosis not present

## 2016-10-01 DIAGNOSIS — J0100 Acute maxillary sinusitis, unspecified: Secondary | ICD-10-CM

## 2016-10-01 DIAGNOSIS — M1712 Unilateral primary osteoarthritis, left knee: Secondary | ICD-10-CM | POA: Diagnosis not present

## 2016-10-01 DIAGNOSIS — M1710 Unilateral primary osteoarthritis, unspecified knee: Secondary | ICD-10-CM | POA: Insufficient documentation

## 2016-10-01 DIAGNOSIS — G8929 Other chronic pain: Secondary | ICD-10-CM

## 2016-10-01 DIAGNOSIS — J4521 Mild intermittent asthma with (acute) exacerbation: Secondary | ICD-10-CM

## 2016-10-01 DIAGNOSIS — M179 Osteoarthritis of knee, unspecified: Secondary | ICD-10-CM | POA: Insufficient documentation

## 2016-10-01 MED ORDER — AZITHROMYCIN 250 MG PO TABS: ORAL_TABLET | ORAL | 0 refills | Status: DC

## 2016-10-01 MED ORDER — PREDNISONE 20 MG PO TABS: 20.0000 mg | ORAL_TABLET | Freq: Every day | ORAL | 0 refills | Status: DC

## 2016-10-01 MED ORDER — ALBUTEROL SULFATE HFA 108 (90 BASE) MCG/ACT IN AERS: 2.0000 | INHALATION_SPRAY | Freq: Four times a day (QID) | RESPIRATORY_TRACT | 2 refills | Status: DC | PRN

## 2016-10-01 MED ORDER — TRIAMCINOLONE ACETONIDE 40 MG/ML IJ SUSP
40.0000 mg | Freq: Once | INTRAMUSCULAR | Status: AC
Start: 2016-10-01 — End: 2016-10-01
  Administered 2016-10-01: 40 mg via INTRAMUSCULAR

## 2016-10-01 NOTE — Assessment & Plan Note (Signed)
Secondary to asthma flare

## 2016-10-01 NOTE — Progress Notes (Signed)
BP 139/87 (BP Location: Left Arm, Patient Position: Sitting, Cuff Size: Large)   Pulse 96   Temp 98.4 F (36.9 C)   Wt 252 lb 6.4 oz (114.5 kg)   SpO2 96%   BMI 42.52 kg/m    Subjective:    Patient ID: Jessica Brooks, female    DOB: 04/28/1951, 66 y.o.   MRN: [id number]  HPI: Jessica Brooks is a 66 y.o. female  Chief Complaint  Patient presents with  . Knee Injection    left knee  . URI    pt states she has had a cough, sinus pressure, a little sore throat, and congestion since last Thursday.    Cough  This is a new problem. The current episode started in the past 7 days. The problem has been unchanged. The problem occurs constantly. The cough is non-productive. Associated symptoms include nasal congestion, rhinorrhea and a sore throat. Nothing aggravates the symptoms. She has tried nothing for the symptoms. Her past medical history is significant for asthma.   Has an Albuterol inhaler that she needs a refill as she is out  Knee Here for a left knee injection.  X-ray showed OA.  Knee bothering off and on for many years.    Relevant past medical, surgical, family and social history reviewed and updated as indicated. Interim medical history since our last visit reviewed. Allergies and medications reviewed and updated.  Review of Systems  HENT: Positive for rhinorrhea and sore throat.   Respiratory: Positive for cough.     Per HPI unless specifically indicated above     Objective:    BP 139/87 (BP Location: Left Arm, Patient Position: Sitting, Cuff Size: Large)   Pulse 96   Temp 98.4 F (36.9 C)   Wt 252 lb 6.4 oz (114.5 kg)   SpO2 96%   BMI 42.52 kg/m   Wt Readings from Last 3 Encounters:  10/01/16 252 lb 6.4 oz (114.5 kg)  09/09/16 255 lb 6.4 oz (115.8 kg)  07/01/16 252 lb (114.3 kg)    Physical Exam  Constitutional: She is oriented to person, place, and time. She appears well-developed and well-nourished. No distress.  HENT:  Head:  Normocephalic and atraumatic.  Right Ear: Tympanic membrane and ear canal normal.  Left Ear: Tympanic membrane and ear canal normal.  Nose: Rhinorrhea present. Right sinus exhibits no maxillary sinus tenderness and no frontal sinus tenderness. Left sinus exhibits no maxillary sinus tenderness and no frontal sinus tenderness.  Mouth/Throat: Mucous membranes are normal. Posterior oropharyngeal erythema present.  Eyes: Conjunctivae and lids are normal. Right eye exhibits no discharge. Left eye exhibits no discharge. No scleral icterus.  Cardiovascular: Normal rate and regular rhythm.   Pulmonary/Chest: Effort normal. No respiratory distress. She has decreased breath sounds.  Abdominal: Normal appearance. There is no splenomegaly or hepatomegaly.  Musculoskeletal: Normal range of motion.  Neurological: She is alert and oriented to person, place, and time.  Skin: Skin is intact. No rash noted. No pallor.  Psychiatric: She has a normal mood and affect. Her behavior is normal. Judgment and thought content normal.   STEROID INJECTION  Procedure: Knee Intraarticular Steroid Injection   Description: After verbal consent and patient ed on Area prepped and draped using  semi-sterile technique. Using a anterior  approach, a mixture of 4 cc of  1% Marcaine & 1 cc of Kenalog 40 was injected into knee joint.  A bandage was then placed over the injection site. Complications:  none Post Procedure  Instructions: To the ER if any symptoms of erythema or swelling.      Results for orders placed or performed in visit on 12/14/15  Hepatitis C antibody  Result Value Ref Range   Hep C Virus Ab <0.1 0.0 - 0.9 s/co ratio  Comprehensive metabolic panel  Result Value Ref Range   Glucose 90 65 - 99 mg/dL   BUN 9 8 - 27 mg/dL   Creatinine, Ser 1.61 0.57 - 1.00 mg/dL   GFR calc non Af Amer 95 >59 mL/min/1.73   GFR calc Af Amer 110 >59 mL/min/1.73   BUN/Creatinine Ratio 15 12 - 28   Sodium 143 134 - 144 mmol/L    Potassium 4.6 3.5 - 5.2 mmol/L   Chloride 102 96 - 106 mmol/L   CO2 25 18 - 29 mmol/L   Calcium 9.4 8.7 - 10.3 mg/dL   Total Protein 7.0 6.0 - 8.5 g/dL   Albumin 3.9 3.6 - 4.8 g/dL   Globulin, Total 3.1 1.5 - 4.5 g/dL   Albumin/Globulin Ratio 1.3 1.2 - 2.2   Bilirubin Total 0.3 0.0 - 1.2 mg/dL   Alkaline Phosphatase 66 39 - 117 IU/L   AST 11 0 - 40 IU/L   ALT 14 0 - 32 IU/L  Lipid Panel w/o Chol/HDL Ratio  Result Value Ref Range   Cholesterol, Total 170 100 - 199 mg/dL   Triglycerides 76 0 - 149 mg/dL   HDL 62 >09 mg/dL   VLDL Cholesterol Cal 15 5 - 40 mg/dL   LDL Calculated 93 0 - 99 mg/dL  Microalbumin, Urine Waived  Result Value Ref Range   Microalb, Ur Waived 30 (H) 0 - 19 mg/L   Creatinine, Urine Waived 100 10 - 300 mg/dL   Microalb/Creat Ratio 30-300 (H) <30 mg/g      Assessment & Plan:   Problem List Items Addressed This Visit      Unprioritized   Asthma    Cough related to asthma flare.  Refill Albuterol.  Rx for Prednisone 20 mg daily for 5 days.        Relevant Medications   triamcinolone acetonide (KENALOG-40) injection 40 mg (Completed)   predniSONE (DELTASONE) 20 MG tablet   albuterol (PROVENTIL HFA;VENTOLIN HFA) 108 (90 Base) MCG/ACT inhaler   Cough    Secondary to asthma flare      Knee osteoarthritis   Relevant Medications   triamcinolone acetonide (KENALOG-40) injection 40 mg (Completed)   predniSONE (DELTASONE) 20 MG tablet   Left knee pain - Primary   Relevant Medications   triamcinolone acetonide (KENALOG-40) injection 40 mg (Completed)   Sinusitis   Relevant Medications   triamcinolone acetonide (KENALOG-40) injection 40 mg (Completed)   predniSONE (DELTASONE) 20 MG tablet   azithromycin (ZITHROMAX Z-PAK) 250 MG tablet       Follow up plan: Return if symptoms worsen or fail to improve.

## 2016-10-01 NOTE — Assessment & Plan Note (Addendum)
Cough related to asthma flare.  Refill Albuterol.  Rx for Prednisone 20 mg daily for 5 days.

## 2016-10-18 DIAGNOSIS — J208 Acute bronchitis due to other specified organisms: Secondary | ICD-10-CM | POA: Diagnosis not present

## 2016-10-18 DIAGNOSIS — J019 Acute sinusitis, unspecified: Secondary | ICD-10-CM | POA: Diagnosis not present

## 2016-10-18 DIAGNOSIS — J40 Bronchitis, not specified as acute or chronic: Secondary | ICD-10-CM | POA: Diagnosis not present

## 2016-11-12 ENCOUNTER — Encounter: Payer: Self-pay | Admitting: Unknown Physician Specialty

## 2016-11-12 ENCOUNTER — Ambulatory Visit (INDEPENDENT_AMBULATORY_CARE_PROVIDER_SITE_OTHER): Payer: Medicare HMO | Admitting: Unknown Physician Specialty

## 2016-11-12 DIAGNOSIS — R05 Cough: Secondary | ICD-10-CM | POA: Diagnosis not present

## 2016-11-12 DIAGNOSIS — R059 Cough, unspecified: Secondary | ICD-10-CM

## 2016-11-12 MED ORDER — PREDNISONE 20 MG PO TABS: 20.0000 mg | ORAL_TABLET | Freq: Every day | ORAL | 0 refills | Status: DC

## 2016-11-12 MED ORDER — MONTELUKAST SODIUM 10 MG PO TABS: 10.0000 mg | ORAL_TABLET | Freq: Every day | ORAL | 0 refills | Status: DC

## 2016-11-12 NOTE — Progress Notes (Signed)
BP 136/89 (BP Location: Left Arm, Cuff Size: Large)   Pulse 91   Temp 98.2 F (36.8 C)   Wt 249 lb 9.6 oz (113.2 kg)   SpO2 96%   BMI 42.05 kg/m    Subjective:    Patient ID: Jessica Brooks, female    DOB: 10-15-1950, 66 y.o.   MRN: [id number]  HPI: Jessica Brooks is a 66 y.o. female  Chief Complaint  Patient presents with  . Cough    pt states she has had a persistant cough that usually happens at night and in the mornings, states she also has a lot of drainage. States she went to [facility] 10/18/16 and was given Augmentin and flonase. States that she finished those and has also tried mucinex,    Cough  This is a new problem. Episode onset: 5 week. Progression since onset: Got some help with the antibiotics as above and is better, but still coughing. The problem occurs constantly. Cough characteristics: foamy. Associated symptoms include nasal congestion and postnasal drip. Pertinent negatives include no chest pain, chills, shortness of breath or wheezing. Treatments tried: note as above. The treatment provided moderate relief.   Relevant past medical, surgical, family and social history reviewed and updated as indicated. Interim medical history since our last visit reviewed. Allergies and medications reviewed and updated.  Review of Systems  Constitutional: Negative for chills.  HENT: Positive for postnasal drip.   Respiratory: Positive for cough. Negative for shortness of breath and wheezing.   Cardiovascular: Negative for chest pain.    Per HPI unless specifically indicated above     Objective:    BP 136/89 (BP Location: Left Arm, Cuff Size: Large)   Pulse 91   Temp 98.2 F (36.8 C)   Wt 249 lb 9.6 oz (113.2 kg)   SpO2 96%   BMI 42.05 kg/m   Wt Readings from Last 3 Encounters:  11/12/16 249 lb 9.6 oz (113.2 kg)  10/01/16 252 lb 6.4 oz (114.5 kg)  09/09/16 255 lb 6.4 oz (115.8 kg)    Physical Exam  Constitutional: She is oriented to person, place,  and time. She appears well-developed and well-nourished. No distress.  HENT:  Head: Normocephalic and atraumatic.  Right Ear: Tympanic membrane and ear canal normal.  Left Ear: Tympanic membrane and ear canal normal.  Nose: Rhinorrhea present. Right sinus exhibits no maxillary sinus tenderness and no frontal sinus tenderness. Left sinus exhibits no maxillary sinus tenderness and no frontal sinus tenderness.  Mouth/Throat: Mucous membranes are normal. Posterior oropharyngeal erythema present.  Eyes: Conjunctivae and lids are normal. Right eye exhibits no discharge. Left eye exhibits no discharge. No scleral icterus.  Cardiovascular: Normal rate and regular rhythm.   Pulmonary/Chest: Effort normal and breath sounds normal. No respiratory distress.  Abdominal: Normal appearance. There is no splenomegaly or hepatomegaly.  Musculoskeletal: Normal range of motion.  Neurological: She is alert and oriented to person, place, and time.  Skin: Skin is intact. No rash noted. No pallor.  Psychiatric: She has a normal mood and affect. Her behavior is normal. Judgment and thought content normal.    Results for orders placed or performed in visit on 12/14/15  Hepatitis C antibody  Result Value Ref Range   Hep C Virus Ab <0.1 0.0 - 0.9 s/co ratio  Comprehensive metabolic panel  Result Value Ref Range   Glucose 90 65 - 99 mg/dL   BUN 9 8 - 27 mg/dL   Creatinine, Ser 4.090.60 0.57 -  1.00 mg/dL   GFR calc non Af Amer 95 >59 mL/min/1.73   GFR calc Af Amer 110 >59 mL/min/1.73   BUN/Creatinine Ratio 15 12 - 28   Sodium 143 134 - 144 mmol/L   Potassium 4.6 3.5 - 5.2 mmol/L   Chloride 102 96 - 106 mmol/L   CO2 25 18 - 29 mmol/L   Calcium 9.4 8.7 - 10.3 mg/dL   Total Protein 7.0 6.0 - 8.5 g/dL   Albumin 3.9 3.6 - 4.8 g/dL   Globulin, Total 3.1 1.5 - 4.5 g/dL   Albumin/Globulin Ratio 1.3 1.2 - 2.2   Bilirubin Total 0.3 0.0 - 1.2 mg/dL   Alkaline Phosphatase 66 39 - 117 IU/L   AST 11 0 - 40 IU/L   ALT 14 0  - 32 IU/L  Lipid Panel w/o Chol/HDL Ratio  Result Value Ref Range   Cholesterol, Total 170 100 - 199 mg/dL   Triglycerides 76 0 - 149 mg/dL   HDL 62 >16 mg/dL   VLDL Cholesterol Cal 15 5 - 40 mg/dL   LDL Calculated 93 0 - 99 mg/dL  Microalbumin, Urine Waived  Result Value Ref Range   Microalb, Ur Waived 30 (H) 0 - 19 mg/L   Creatinine, Urine Waived 100 10 - 300 mg/dL   Microalb/Creat Ratio 30-300 (H) <30 mg/g      Assessment & Plan:   Problem List Items Addressed This Visit      Unprioritized   Cough    Suspect related to post nasal drip.  Will rx round of prednisone and continue Flonase.  Give rx for Singulair 10 mg for 30 days          Follow up plan: Return if symptoms worsen or fail to improve.

## 2016-11-12 NOTE — Assessment & Plan Note (Addendum)
Suspect related to post nasal drip.  Will rx round of prednisone and continue Flonase.  Give rx for Singulair 10 mg for 30 days

## 2016-12-08 ENCOUNTER — Encounter: Payer: Self-pay | Admitting: Unknown Physician Specialty

## 2016-12-08 ENCOUNTER — Ambulatory Visit (INDEPENDENT_AMBULATORY_CARE_PROVIDER_SITE_OTHER): Payer: Medicare HMO | Admitting: Unknown Physician Specialty

## 2016-12-08 DIAGNOSIS — G8929 Other chronic pain: Secondary | ICD-10-CM

## 2016-12-08 DIAGNOSIS — M25562 Pain in left knee: Secondary | ICD-10-CM

## 2016-12-08 DIAGNOSIS — J309 Allergic rhinitis, unspecified: Secondary | ICD-10-CM | POA: Diagnosis not present

## 2016-12-08 MED ORDER — MELOXICAM 15 MG PO TABS: 15.0000 mg | ORAL_TABLET | Freq: Every day | ORAL | 3 refills | Status: DC

## 2016-12-08 MED ORDER — MONTELUKAST SODIUM 10 MG PO TABS: 10.0000 mg | ORAL_TABLET | Freq: Every day | ORAL | 1 refills | Status: DC

## 2016-12-08 MED ORDER — BENZONATATE 100 MG PO CAPS: 100.0000 mg | ORAL_CAPSULE | Freq: Three times a day (TID) | ORAL | 1 refills | Status: DC | PRN

## 2016-12-08 NOTE — Assessment & Plan Note (Signed)
OK to inject in 3 months.  Refill Mobic.  Consider Orthopedic referral

## 2016-12-08 NOTE — Progress Notes (Signed)
BP 137/86 (BP Location: Left Arm, Cuff Size: Large)   Pulse 85   Temp 98 F (36.7 C)   Wt 249 lb 3.2 oz (113 kg)   SpO2 96%   BMI 41.98 kg/m    Subjective:    Patient ID: Jessica Brooks, female    DOB: July 10, 1950, 66 y.o.   MRN: [id number]  HPI: Jessica Brooks is a 66 y.o. female  Chief Complaint  Patient presents with  . Cough    pt states she still has a cough and has taken the majority of medications given to her   . Knee Pain    last injection 10/01/16   Cough Pt states she continues with her cough..  States she is better but still with a continuous dry cough in her throat.  She states she was better with the Claritin and Singulair but has run out and latest episode started about 3 days ago after running out.    She does mention something she is taking at bed time but I am not sure what that is.  She is asking for a cough medication.    Knee OA Left knee pain.  X-ray shows OA.  Ran out of Mobic and would like some more  Relevant past medical, surgical, family and social history reviewed and updated as indicated. Interim medical history since our last visit reviewed. Allergies and medications reviewed and updated.  Review of Systems  Per HPI unless specifically indicated above     Objective:    BP 137/86 (BP Location: Left Arm, Cuff Size: Large)   Pulse 85   Temp 98 F (36.7 C)   Wt 249 lb 3.2 oz (113 kg)   SpO2 96%   BMI 41.98 kg/m   Wt Readings from Last 3 Encounters:  12/08/16 249 lb 3.2 oz (113 kg)  11/12/16 249 lb 9.6 oz (113.2 kg)  10/01/16 252 lb 6.4 oz (114.5 kg)    Physical Exam  Constitutional: She is oriented to person, place, and time. She appears well-developed and well-nourished. No distress.  HENT:  Head: Normocephalic and atraumatic.  Nose: Mucosal edema present. Right sinus exhibits no maxillary sinus tenderness and no frontal sinus tenderness. Left sinus exhibits no maxillary sinus tenderness and no frontal sinus tenderness.    Eyes: Conjunctivae and lids are normal. Right eye exhibits no discharge. Left eye exhibits no discharge. No scleral icterus.  Neck: Normal range of motion. Neck supple. No JVD present. Carotid bruit is not present.  Cardiovascular: Normal rate, regular rhythm and normal heart sounds.   Pulmonary/Chest: Effort normal and breath sounds normal.  Abdominal: Normal appearance. There is no splenomegaly or hepatomegaly.  Musculoskeletal: Normal range of motion.  Neurological: She is alert and oriented to person, place, and time.  Skin: Skin is warm, dry and intact. No rash noted. No pallor.  Psychiatric: She has a normal mood and affect. Her behavior is normal. Judgment and thought content normal.    Results for orders placed or performed in visit on 12/14/15  Hepatitis C antibody  Result Value Ref Range   Hep C Virus Ab <0.1 0.0 - 0.9 s/co ratio  Comprehensive metabolic panel  Result Value Ref Range   Glucose 90 65 - 99 mg/dL   BUN 9 8 - 27 mg/dL   Creatinine, Ser 4.09 0.57 - 1.00 mg/dL   GFR calc non Af Amer 95 >59 mL/min/1.73   GFR calc Af Amer 110 >59 mL/min/1.73   BUN/Creatinine Ratio 15 12 -  28   Sodium 143 134 - 144 mmol/L   Potassium 4.6 3.5 - 5.2 mmol/L   Chloride 102 96 - 106 mmol/L   CO2 25 18 - 29 mmol/L   Calcium 9.4 8.7 - 10.3 mg/dL   Total Protein 7.0 6.0 - 8.5 g/dL   Albumin 3.9 3.6 - 4.8 g/dL   Globulin, Total 3.1 1.5 - 4.5 g/dL   Albumin/Globulin Ratio 1.3 1.2 - 2.2   Bilirubin Total 0.3 0.0 - 1.2 mg/dL   Alkaline Phosphatase 66 39 - 117 IU/L   AST 11 0 - 40 IU/L   ALT 14 0 - 32 IU/L  Lipid Panel w/o Chol/HDL Ratio  Result Value Ref Range   Cholesterol, Total 170 100 - 199 mg/dL   Triglycerides 76 0 - 149 mg/dL   HDL 62 >16>39 mg/dL   VLDL Cholesterol Cal 15 5 - 40 mg/dL   LDL Calculated 93 0 - 99 mg/dL  Microalbumin, Urine Waived  Result Value Ref Range   Microalb, Ur Waived 30 (H) 0 - 19 mg/L   Creatinine, Urine Waived 100 10 - 300 mg/dL   Microalb/Creat  Ratio 30-300 (H) <30 mg/g      Assessment & Plan:   Problem List Items Addressed This Visit      Unprioritized   Allergic rhinitis    Refer to ENT due to long-standing and distressing symptoms.  Interfering with singing.  Restart Singulair and Clariton.  Tessalon for the cough      Relevant Orders   Ambulatory referral to ENT   Left knee pain    OK to inject in 3 months.  Refill Mobic.  Consider Orthopedic referral          Follow up plan: Return if symptoms worsen or fail to improve.

## 2016-12-08 NOTE — Assessment & Plan Note (Addendum)
Refer to ENT due to long-standing and distressing symptoms.  Interfering with singing.  Restart Singulair and Clariton.  Tessalon for the cough

## 2017-01-22 ENCOUNTER — Telehealth: Payer: Self-pay | Admitting: Unknown Physician Specialty

## 2017-01-22 NOTE — Telephone Encounter (Signed)
Called pt to schedule for Annual Wellness Visit with Nurse Health Advisor, Tiffany Hill, my c/b # is [phone]  Kathryn Brown ° °

## 2017-04-03 DIAGNOSIS — H353131 Nonexudative age-related macular degeneration, bilateral, early dry stage: Secondary | ICD-10-CM | POA: Diagnosis not present

## 2017-04-03 DIAGNOSIS — H2513 Age-related nuclear cataract, bilateral: Secondary | ICD-10-CM | POA: Diagnosis not present

## 2017-04-03 DIAGNOSIS — R6889 Other general symptoms and signs: Secondary | ICD-10-CM | POA: Diagnosis not present

## 2017-04-08 ENCOUNTER — Ambulatory Visit (INDEPENDENT_AMBULATORY_CARE_PROVIDER_SITE_OTHER): Payer: Medicare HMO

## 2017-04-08 VITALS — BP 130/78 | HR 84 | Temp 98.0°F | Resp 16 | Ht 65.0 in | Wt 249.2 lb

## 2017-04-08 DIAGNOSIS — Z1231 Encounter for screening mammogram for malignant neoplasm of breast: Secondary | ICD-10-CM

## 2017-04-08 DIAGNOSIS — Z23 Encounter for immunization: Secondary | ICD-10-CM

## 2017-04-08 DIAGNOSIS — Z0000 Encounter for general adult medical examination without abnormal findings: Secondary | ICD-10-CM | POA: Diagnosis not present

## 2017-04-08 DIAGNOSIS — R6889 Other general symptoms and signs: Secondary | ICD-10-CM | POA: Diagnosis not present

## 2017-04-08 NOTE — Patient Instructions (Addendum)
Jessica Brooks , Thank you for taking time to come for your Medicare Wellness Visit. I appreciate your ongoing commitment to your health goals. Please review the following plan we discussed and let me know if I can assist you in the future.   Screening recommendations/referrals: Colonoscopy: completed 05/02/2009 Mammogram: completed 04/14/2016, Please call [phone] to schedule your mammogram.  Bone Density: completed 04/14/2016 Recommended yearly ophthalmology/optometry visit for glaucoma screening and checkup Recommended yearly dental visit for hygiene and checkup  Vaccinations: Influenza vaccine: done today  Pneumococcal vaccine: pneumovax 23 done today  Tdap vaccine: up to date Shingles vaccine: up to date  Advanced directives: Advance directive discussed with you today. I have provided a copy for you to complete at home and have notarized. Once this is complete please bring a copy in to our office so we can scan it into your chart.  Conditions/risks identified: Increase walking to 3x a week for at least 20 minutes  Next appointment: Follow up in one year for your annual wellness exam.    Preventive Care 65 Years and Older, Female Preventive care refers to lifestyle choices and visits with your health care provider that can promote health and wellness. What does preventive care include?  A yearly physical exam. This is also called an annual well check.  Dental exams once or twice a year.  Routine eye exams. Ask your health care provider how often you should have your eyes checked.  Personal lifestyle choices, including:  Daily care of your teeth and gums.  Regular physical activity.  Eating a healthy diet.  Avoiding tobacco and drug use.  Limiting alcohol use.  Practicing safe sex.  Taking low-dose aspirin every day.  Taking vitamin and mineral supplements as recommended by your health care provider. What happens during an annual well check? The services  and screenings done by your health care provider during your annual well check will depend on your age, overall health, lifestyle risk factors, and family history of disease. Counseling  Your health care provider may ask you questions about your:  Alcohol use.  Tobacco use.  Drug use.  Emotional well-being.  Home and relationship well-being.  Sexual activity.  Eating habits.  History of falls.  Memory and ability to understand (cognition).  Work and work Astronomer.  Reproductive health. Screening  You may have the following tests or measurements:  Height, weight, and BMI.  Blood pressure.  Lipid and cholesterol levels. These may be checked every 5 years, or more frequently if you are over 69 years old.  Skin check.  Lung cancer screening. You may have this screening every year starting at age 88 if you have a 30-pack-year history of smoking and currently smoke or have quit within the past 15 years.  Fecal occult blood test (FOBT) of the stool. You may have this test every year starting at age 24.  Flexible sigmoidoscopy or colonoscopy. You may have a sigmoidoscopy every 5 years or a colonoscopy every 10 years starting at age 34.  Hepatitis C blood test.  Hepatitis B blood test.  Sexually transmitted disease (STD) testing.  Diabetes screening. This is done by checking your blood sugar (glucose) after you have not eaten for a while (fasting). You may have this done every 1-3 years.  Bone density scan. This is done to screen for osteoporosis. You may have this done starting at age 39.  Mammogram. This may be done every 1-2 years. Talk to your health care provider about how often you should  have regular mammograms. Talk with your health care provider about your test results, treatment options, and if necessary, the need for more tests. Vaccines  Your health care provider may recommend certain vaccines, such as:  Influenza vaccine. This is recommended every  year.  Tetanus, diphtheria, and acellular pertussis (Tdap, Td) vaccine. You may need a Td booster every 10 years.  Zoster vaccine. You may need this after age 64.  Pneumococcal 13-valent conjugate (PCV13) vaccine. One dose is recommended after age 7.  Pneumococcal polysaccharide (PPSV23) vaccine. One dose is recommended after age 67. Talk to your health care provider about which screenings and vaccines you need and how often you need them. This information is not intended to replace advice given to you by your health care provider. Make sure you discuss any questions you have with your health care provider. Document Released: 06/29/2015 Document Revised: 02/20/2016 Document Reviewed: 04/03/2015 Elsevier Interactive Patient Education  2017 [place] Prevention in the Home Falls can cause injuries. They can happen to people of all ages. There are many things you can do to make your home safe and to help prevent falls. What can I do on the outside of my home?  Regularly fix the edges of walkways and driveways and fix any cracks.  Remove anything that might make you trip as you walk through a door, such as a raised step or threshold.  Trim any bushes or trees on the path to your home.  Use bright outdoor lighting.  Clear any walking paths of anything that might make someone trip, such as rocks or tools.  Regularly check to see if handrails are loose or broken. Make sure that both sides of any steps have handrails.  Any raised decks and porches should have guardrails on the edges.  Have any leaves, snow, or ice cleared regularly.  Use sand or salt on walking paths during winter.  Clean up any spills in your garage right away. This includes oil or grease spills. What can I do in the bathroom?  Use night lights.  Install grab bars by the toilet and in the tub and shower. Do not use towel bars as grab bars.  Use non-skid mats or decals in the tub or shower.  If you  need to sit down in the shower, use a plastic, non-slip stool.  Keep the floor dry. Clean up any water that spills on the floor as soon as it happens.  Remove soap buildup in the tub or shower regularly.  Attach bath mats securely with double-sided non-slip rug tape.  Do not have throw rugs and other things on the floor that can make you trip. What can I do in the bedroom?  Use night lights.  Make sure that you have a light by your bed that is easy to reach.  Do not use any sheets or blankets that are too big for your bed. They should not hang down onto the floor.  Have a firm chair that has side arms. You can use this for support while you get dressed.  Do not have throw rugs and other things on the floor that can make you trip. What can I do in the kitchen?  Clean up any spills right away.  Avoid walking on wet floors.  Keep items that you use a lot in easy-to-reach places.  If you need to reach something above you, use a strong step stool that has a grab bar.  Keep electrical cords out of  the way.  Do not use floor polish or wax that makes floors slippery. If you must use wax, use non-skid floor wax.  Do not have throw rugs and other things on the floor that can make you trip. What can I do with my stairs?  Do not leave any items on the stairs.  Make sure that there are handrails on both sides of the stairs and use them. Fix handrails that are broken or loose. Make sure that handrails are as long as the stairways.  Check any carpeting to make sure that it is firmly attached to the stairs. Fix any carpet that is loose or worn.  Avoid having throw rugs at the top or bottom of the stairs. If you do have throw rugs, attach them to the floor with carpet tape.  Make sure that you have a light switch at the top of the stairs and the bottom of the stairs. If you do not have them, ask someone to add them for you. What else can I do to help prevent falls?  Wear shoes  that:  Do not have high heels.  Have rubber bottoms.  Are comfortable and fit you well.  Are closed at the toe. Do not wear sandals.  If you use a stepladder:  Make sure that it is fully opened. Do not climb a closed stepladder.  Make sure that both sides of the stepladder are locked into place.  Ask someone to hold it for you, if possible.  Clearly mark and make sure that you can see:  Any grab bars or handrails.  First and last steps.  Where the edge of each step is.  Use tools that help you move around (mobility aids) if they are needed. These include:  Canes.  Walkers.  Scooters.  Crutches.  Turn on the lights when you go into a dark area. Replace any light bulbs as soon as they burn out.  Set up your furniture so you have a clear path. Avoid moving your furniture around.  If any of your floors are uneven, fix them.  If there are any pets around you, be aware of where they are.  Review your medicines with your doctor. Some medicines can make you feel dizzy. This can increase your chance of falling. Ask your doctor what other things that you can do to help prevent falls. This information is not intended to replace advice given to you by your health care provider. Make sure you discuss any questions you have with your health care provider. Document Released: 03/29/2009 Document Revised: 11/08/2015 Document Reviewed: 07/07/2014 Elsevier Interactive Patient Education  2017 [employer]  Pneumococcal Polysaccharide Vaccine: What You Need to Know 1. Why get vaccinated? Vaccination can protect older adults (and some children and younger adults) from pneumococcal disease. Pneumococcal disease is caused by bacteria that can spread from person to person through close contact. It can cause ear infections, and it can also lead to more serious infections of the:  Lungs (pneumonia),  Blood (bacteremia), and  Covering of the brain and spinal cord (meningitis).  Meningitis can cause deafness and brain damage, and it can be fatal.  Anyone can get pneumococcal disease, but children under 43 years of age, people with certain medical conditions, adults over 53 years of age, and cigarette smokers are at the highest risk. About 18,000 older adults die each year from pneumococcal disease in the [place]. Treatment of pneumococcal infections with penicillin and other drugs used to be more  effective. But some strains of the disease have become resistant to these drugs. This makes prevention of the disease, through vaccination, even more important. 2. Pneumococcal polysaccharide vaccine (PPSV23) Pneumococcal polysaccharide vaccine (PPSV23) protects against 23 types of pneumococcal bacteria. It will not prevent all pneumococcal disease. PPSV23 is recommended for:  All adults 74 years of age and older,  Anyone 2 through 68 years of age with certain long-term health problems,  Anyone 2 through 68 years of age with a weakened immune system,  Adults 22 through 68 years of age who smoke cigarettes or have asthma.  Most people need only one dose of PPSV. A second dose is recommended for certain high-risk groups. People 29 and older should get a dose even if they have gotten one or more doses of the vaccine before they turned 65. Your healthcare provider can give you more information about these recommendations. Most healthy adults develop protection within 2 to 3 weeks of getting the shot. 3. Some people should not get this vaccine  Anyone who has had a life-threatening allergic reaction to PPSV should not get another dose.  Anyone who has a severe allergy to any component of PPSV should not receive it. Tell your provider if you have any severe allergies.  Anyone who is moderately or severely ill when the shot is scheduled may be asked to wait until they recover before getting the vaccine. Someone with a mild illness can usually be vaccinated.  Children less  than 58 years of age should not receive this vaccine.  There is no evidence that PPSV is harmful to either a pregnant woman or to her fetus. However, as a precaution, women who need the vaccine should be vaccinated before becoming pregnant, if possible. 4. Risks of a vaccine reaction With any medicine, including vaccines, there is a chance of side effects. These are usually mild and go away on their own, but serious reactions are also possible. About half of people who get PPSV have mild side effects, such as redness or pain where the shot is given, which go away within about two days. Less than 1 out of 100 people develop a fever, muscle aches, or more severe local reactions. Problems that could happen after any vaccine:  People sometimes faint after a medical procedure, including vaccination. Sitting or lying down for about 15 minutes can help prevent fainting, and injuries caused by a fall. Tell your doctor if you feel dizzy, or have vision changes or ringing in the ears.  Some people get severe pain in the shoulder and have difficulty moving the arm where a shot was given. This happens very rarely.  Any medication can cause a severe allergic reaction. Such reactions from a vaccine are very rare, estimated at about 1 in a million doses, and would happen within a few minutes to a few hours after the vaccination. As with any medicine, there is a very remote chance of a vaccine causing a serious injury or death. The safety of vaccines is always being monitored. For more information, visit: http://floyd.org/ 5. What if there is a serious reaction? What should I look for? Look for anything that concerns you, such as signs of a severe allergic reaction, very high fever, or unusual behavior. Signs of a severe allergic reaction can include hives, swelling of the face and throat, difficulty breathing, a fast heartbeat, dizziness, and weakness. These would usually start a few minutes to a few  hours after the vaccination. What should I do? If  you think it is a severe allergic reaction or other emergency that can't wait, call 9-1-1 or get to the nearest hospital. Otherwise, call your doctor. Afterward, the reaction should be reported to the Vaccine Adverse Event Reporting System (VAERS). Your doctor might file this report, or you can do it yourself through the VAERS web site at www.vaers.LAgents.nohhs.gov, or by calling 1-628-429-5023. VAERS does not give medical advice. 6. How can I learn more?  Ask your doctor. He or she can give you the vaccine package insert or suggest other sources of information.  Call your local or state health department.  Contact the Centers for Disease Control and Prevention (CDC): ? Call 818 459 73031-250-688-1996 (1-800-CDC-INFO) or ? Visit CDC's website at PicCapture.uywww.cdc.gov/vaccines CDC Pneumococcal Polysaccharide Vaccine VIS (10/07/13) This information is not intended to replace advice given to you by your health care provider. Make sure you discuss any questions you have with your health care provider. Document Released: 03/30/2006 Document Revised: 02/21/2016 Document Reviewed: 02/21/2016 Elsevier Interactive Patient Education  2017 Else[employer] Inc.   Influenza (Flu) Vaccine (Inactivated or Recombinant): What You Need to Know 1. Why get vaccinated? Influenza ("flu") is a contagious disease that spreads around the [place] every year, usually between October and May. Flu is caused by influenza viruses, and is spread mainly by coughing, sneezing, and close contact. Anyone can get flu. Flu strikes suddenly and can last several days. Symptoms vary by age, but can include:  fever/chills  sore throat  muscle aches  fatigue  cough  headache  runny or stuffy nose  Flu can also lead to pneumonia and blood infections, and cause diarrhea and seizures in children. If you have a medical condition, such as heart or lung disease, flu can make it worse. Flu is more dangerous  for some people. Infants and young children, people 68 years of age and older, pregnant women, and people with certain health conditions or a weakened immune system are at greatest risk. Each year thousands of people in the [place] die from flu, and many more are hospitalized. Flu vaccine can:  keep you from getting flu,  make flu less severe if you do get it, and  keep you from spreading flu to your family and other people. 2. Inactivated and recombinant flu vaccines A dose of flu vaccine is recommended every flu season. Children 6 months through 788 years of age may need two doses during the same flu season. Everyone else needs only one dose each flu season. Some inactivated flu vaccines contain a very small amount of a mercury-based preservative called thimerosal. Studies have not shown thimerosal in vaccines to be harmful, but flu vaccines that do not contain thimerosal are available. There is no live flu virus in flu shots. They cannot cause the flu. There are many flu viruses, and they are always changing. Each year a new flu vaccine is made to protect against three or four viruses that are likely to cause disease in the upcoming flu season. But even when the vaccine doesn't exactly match these viruses, it may still provide some protection. Flu vaccine cannot prevent:  flu that is caused by a virus not covered by the vaccine, or  illnesses that look like flu but are not.  It takes about 2 weeks for protection to develop after vaccination, and protection lasts through the flu season. 3. Some people should not get this vaccine Tell the person who is giving you the vaccine:  If you have any severe, life-threatening allergies. If  you ever had a life-threatening allergic reaction after a dose of flu vaccine, or have a severe allergy to any part of this vaccine, you may be advised not to get vaccinated. Most, but not all, types of flu vaccine contain a small amount of egg protein.  If  you ever had Guillain-Barr Syndrome (also called GBS). Some people with a history of GBS should not get this vaccine. This should be discussed with your doctor.  If you are not feeling well. It is usually okay to get flu vaccine when you have a mild illness, but you might be asked to come back when you feel better.  4. Risks of a vaccine reaction With any medicine, including vaccines, there is a chance of reactions. These are usually mild and go away on their own, but serious reactions are also possible. Most people who get a flu shot do not have any problems with it. Minor problems following a flu shot include:  soreness, redness, or swelling where the shot was given  hoarseness  sore, red or itchy eyes  cough  fever  aches  headache  itching  fatigue  If these problems occur, they usually begin soon after the shot and last 1 or 2 days. More serious problems following a flu shot can include the following:  There may be a small increased risk of Guillain-Barre Syndrome (GBS) after inactivated flu vaccine. This risk has been estimated at 1 or 2 additional cases per million people vaccinated. This is much lower than the risk of severe complications from flu, which can be prevented by flu vaccine.  Young children who get the flu shot along with pneumococcal vaccine (PCV13) and/or DTaP vaccine at the same time might be slightly more likely to have a seizure caused by fever. Ask your doctor for more information. Tell your doctor if a child who is getting flu vaccine has ever had a seizure.  Problems that could happen after any injected vaccine:  People sometimes faint after a medical procedure, including vaccination. Sitting or lying down for about 15 minutes can help prevent fainting, and injuries caused by a fall. Tell your doctor if you feel dizzy, or have vision changes or ringing in the ears.  Some people get severe pain in the shoulder and have difficulty moving the arm where  a shot was given. This happens very rarely.  Any medication can cause a severe allergic reaction. Such reactions from a vaccine are very rare, estimated at about 1 in a million doses, and would happen within a few minutes to a few hours after the vaccination. As with any medicine, there is a very remote chance of a vaccine causing a serious injury or death. The safety of vaccines is always being monitored. For more information, visit: http://floyd.org/ 5. What if there is a serious reaction? What should I look for? Look for anything that concerns you, such as signs of a severe allergic reaction, very high fever, or unusual behavior. Signs of a severe allergic reaction can include hives, swelling of the face and throat, difficulty breathing, a fast heartbeat, dizziness, and weakness. These would start a few minutes to a few hours after the vaccination. What should I do?  If you think it is a severe allergic reaction or other emergency that can't wait, call 9-1-1 and get the person to the nearest hospital. Otherwise, call your doctor.  Reactions should be reported to the Vaccine Adverse Event Reporting System (VAERS). Your doctor should file this report, or  you can do it yourself through the VAERS web site at www.vaers.LAgents.no, or by calling 1-(513)184-3793. ? VAERS does not give medical advice. 6. The National Vaccine Injury Compensation Program The [employer] Vaccine Injury Compensation Program (VICP) is a federal program that was created to compensate people who may have been injured by certain vaccines. Persons who believe they may have been injured by a vaccine can learn about the program and about filing a claim by calling 1-806 131 3475 or visiting the VICP website at SpiritualWord.at. There is a time limit to file a claim for compensation. 7. How can I learn more?  Ask your healthcare provider. He or she can give you the vaccine package insert or suggest other sources  of information.  Call your local or state health department.  Contact the Centers for Disease Control and Prevention (CDC): ? Call 2722562478 (1-800-CDC-INFO) or ? Visit CDC's website at BiotechRoom.com.cy Vaccine Information Statement, Inactivated Influenza Vaccine (01/20/2014) This information is not intended to replace advice given to you by your health care provider. Make sure you discuss any questions you have with your health care provider. Document Released: 03/27/2006 Document Revised: 02/21/2016 Document Reviewed: 02/21/2016 Elsevier Interactive Patient Education  2017 [employer].

## 2017-04-08 NOTE — Progress Notes (Signed)
Subjective:   Jessica Brooks is a 66 y.o. female who presents for Medicare Annual (Subsequent) preventive examination.  Review of Systems:   Cardiac Risk Factors include: advanced age (>42men, >42 women);obesity (BMI >30kg/m2);hypertension     Objective:     Vitals: BP 130/78 (BP Location: Left Arm, Patient Position: Sitting)   Pulse 84   Temp 98 F (36.7 C)   Resp 16   Ht 5\' 5"  (1.651 m)   Wt 249 lb 3.2 oz (113 kg)   BMI 41.47 kg/m   Body mass index is 41.47 kg/m.   Tobacco History  Smoking Status  . Former Smoker  . Quit date: 06/16/1978  Smokeless Tobacco  . Never Used     Counseling given: Not Answered   Past Medical History:  Diagnosis Date  . Cataract   . Hypertension   . Keloid    Past Surgical History:  Procedure Laterality Date  . ABDOMINAL HYSTERECTOMY    . BREAST BIOPSY Right    needle bx years ago unsure of date  . BREAST BIOPSY Right    another bx done by [facility]   . 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 MULTIPLE TOOTH EXTRACTIONS     Family History  Problem Relation Age of Onset  . Diabetes Mother   . Hypertension Mother   . Cancer Father        throat  . Diabetes Sister   . Kidney disease Sister   . Diabetes Brother   . Kidney disease Brother    History  Sexual Activity  . Sexual activity: No    Outpatient Encounter Prescriptions as of 04/08/2017  Medication Sig  . Cholecalciferol (VITAMIN D) 2000 units CAPS Take 2 capsules by mouth once.  . fluticasone (FLONASE) 50 MCG/ACT nasal spray Place 2 sprays into both nostrils 2 (two) times daily.  Marland Kitchen loratadine (CLARITIN) 10 MG tablet Take 10 mg by mouth daily.  . meloxicam (MOBIC) 15 MG tablet Take 1 tablet (15 mg total) by mouth daily.  . mupirocin cream (BACTROBAN) 2 % Apply 1 application topically 2 (two) times daily.  Marland Kitchen albuterol (PROVENTIL HFA;VENTOLIN HFA) 108 (90 Base) MCG/ACT inhaler Inhale 2 puffs into the lungs every 6 (six) hours as needed  for wheezing or shortness of breath. (Patient not taking: Reported on 04/08/2017)  . montelukast (SINGULAIR) 10 MG tablet Take 1 tablet (10 mg total) by mouth at bedtime. (Patient not taking: Reported on 04/08/2017)  . [DISCONTINUED] benzonatate (TESSALON) 100 MG capsule Take 1 capsule (100 mg total) by mouth 3 (three) times daily as needed for cough. (Patient not taking: Reported on 04/08/2017)   No facility-administered encounter medications on file as of 04/08/2017.     Activities of Daily Living In your present state of health, do you have any difficulty performing the following activities: 04/08/2017  Hearing? N  Vision? Y  Comment has cataract sx scheduled for Dec.  Difficulty concentrating or making decisions? Y  Walking or climbing stairs? Y  Comment d/t knee pain  Dressing or bathing? N  Doing errands, shopping? N  Preparing Food and eating ? N  Using the Toilet? N  In the past six months, have you accidently leaked urine? N  Do you have problems with loss of bowel control? N  Managing your Medications? N  Managing your Finances? N  Housekeeping or managing your Housekeeping? N  Some recent data might be hidden    Patient Care  Team: Gabriel CirriWicker, Cheryl, NP as PCP - General (Nurse Practitioner)    Assessment:     Exercise Activities and Dietary recommendations Current Exercise Habits: The patient does not participate in regular exercise at present, Exercise limited by: orthopedic condition(s) (knee pain)  Goals    . Increase physical activity          Increase walking to 3x a week for at least 20 minutes      Fall Risk Fall Risk  04/08/2017 12/14/2015 11/20/2015  Falls in the past year? No No No   Depression Screen PHQ 2/9 Scores 04/08/2017 12/14/2015 11/20/2015 11/20/2015  PHQ - 2 Score 0 0 1 1  PHQ- 9 Score 1 - - -     Cognitive Function     6CIT Screen 04/08/2017  What Year? 0 points  What month? 0 points  What time? 0 points  Count back from 20 0 points    Months in reverse 0 points  Repeat phrase 0 points  Total Score 0    Immunization History  Administered Date(s) Administered  . Influenza, High Dose Seasonal PF 04/08/2017  . Influenza-Unspecified 03/24/2016  . Pneumococcal Conjugate-13 12/14/2015  . Pneumococcal Polysaccharide-23 04/08/2017  . Tdap 11/20/2015  . Zoster 11/20/2015   Screening Tests Health Maintenance  Topic Date Due  . PNA vac Low Risk Adult (2 of 2 - PPSV23) 12/13/2016  . INFLUENZA VACCINE  01/14/2017  . MAMMOGRAM  04/14/2018  . COLONOSCOPY  04/24/2019  . TETANUS/TDAP  11/19/2025  . DEXA SCAN  Completed  . Hepatitis C Screening  Completed      Plan:     I have personally reviewed and addressed the Medicare Annual Wellness questionnaire and have noted the following in the patient's chart:  A. Medical and social history B. Use of alcohol, tobacco or illicit drugs  C. Current medications and supplements D. Functional ability and status E.  Nutritional status F.  Physical activity G. Advance directives H. List of other physicians I.  Hospitalizations, surgeries, and ER visits in previous 12 months J.  Vitals K. Screenings such as hearing and vision if needed, cognitive and depression L. Referrals and appointments   In addition, I have reviewed and discussed with patient certain preventive protocols, quality metrics, and best practice recommendations. A written personalized care plan for preventive services as well as general preventive health recommendations were provided to patient.   Signed,  Marin Robertsiffany Hadlei Stitt, LPN Nurse Health Advisor   MD Recommendations: needs refill on flonase, and albuterol inhaler , bactroban, singulair.

## 2017-05-28 DIAGNOSIS — H2512 Age-related nuclear cataract, left eye: Secondary | ICD-10-CM | POA: Diagnosis not present

## 2017-06-05 ENCOUNTER — Encounter: Payer: Self-pay | Admitting: *Deleted

## 2017-06-11 ENCOUNTER — Ambulatory Visit
Admission: RE | Admit: 2017-06-11 | Discharge: 2017-06-11 | Disposition: A | Discharge status: Home / Self Care | Payer: Medicare HMO | Source: Ambulatory Visit | Attending: Ophthalmology | Admitting: Ophthalmology

## 2017-06-11 ENCOUNTER — Ambulatory Visit: Payer: Medicare HMO | Admitting: Certified Registered Nurse Anesthetist

## 2017-06-11 ENCOUNTER — Encounter
Admission: RE | Disposition: A | Discharge status: Home / Self Care | Payer: Self-pay | Source: Ambulatory Visit | Attending: Ophthalmology

## 2017-06-11 ENCOUNTER — Encounter: Payer: Self-pay | Admitting: *Deleted

## 2017-06-11 DIAGNOSIS — J45909 Unspecified asthma, uncomplicated: Secondary | ICD-10-CM | POA: Diagnosis not present

## 2017-06-11 DIAGNOSIS — H2512 Age-related nuclear cataract, left eye: Secondary | ICD-10-CM | POA: Diagnosis not present

## 2017-06-11 DIAGNOSIS — I10 Essential (primary) hypertension: Secondary | ICD-10-CM | POA: Diagnosis not present

## 2017-06-11 DIAGNOSIS — M179 Osteoarthritis of knee, unspecified: Secondary | ICD-10-CM | POA: Diagnosis not present

## 2017-06-11 DIAGNOSIS — Z6841 Body Mass Index (BMI) 40.0 and over, adult: Secondary | ICD-10-CM | POA: Diagnosis not present

## 2017-06-11 DIAGNOSIS — H269 Unspecified cataract: Secondary | ICD-10-CM | POA: Diagnosis not present

## 2017-06-11 HISTORY — DX: Unspecified osteoarthritis, unspecified site: M19.90

## 2017-06-11 HISTORY — DX: Unspecified asthma, uncomplicated: J45.909

## 2017-06-11 HISTORY — PX: CATARACT EXTRACTION W/PHACO: SHX586

## 2017-06-11 SURGERY — PHACOEMULSIFICATION, CATARACT, WITH IOL INSERTION
Anesthesia: Monitor Anesthesia Care | Site: Eye | Laterality: Left | Wound class: Clean

## 2017-06-11 MED ORDER — NEOMYCIN-POLYMYXIN-DEXAMETH 0.1 % OP OINT
TOPICAL_OINTMENT | OPHTHALMIC | Status: DC | PRN
  Administered 2017-06-11: 1 via OPHTHALMIC

## 2017-06-11 MED ORDER — MOXIFLOXACIN HCL 0.5 % OP SOLN
1.0000 [drp] | Freq: Once | OPHTHALMIC | Status: AC
  Administered 2017-06-11: 1 [drp] via OPHTHALMIC

## 2017-06-11 MED ORDER — MOXIFLOXACIN HCL 0.5 % OP SOLN
OPHTHALMIC | Status: AC
  Administered 2017-06-11 (×2)
  Filled 2017-06-11: qty 3

## 2017-06-11 MED ORDER — POVIDONE-IODINE 5 % OP SOLN
OPHTHALMIC | Status: DC | PRN
  Administered 2017-06-11: 1 via OPHTHALMIC

## 2017-06-11 MED ORDER — LIDOCAINE HCL (PF) 4 % IJ SOLN
INTRAMUSCULAR | Status: DC | PRN
  Administered 2017-06-11: 1 mL via OPHTHALMIC

## 2017-06-11 MED ORDER — NA HYALUR & NA CHOND-NA HYALUR 0.4-0.35 ML IO KIT
PACK | INTRAOCULAR | Status: DC | PRN
  Administered 2017-06-11: .35 mL via INTRAOCULAR

## 2017-06-11 MED ORDER — SODIUM CHLORIDE 0.9 % IV SOLN
INTRAVENOUS | Status: DC
  Administered 2017-06-11: 07:00:00 via INTRAVENOUS

## 2017-06-11 MED ORDER — FENTANYL CITRATE (PF) 100 MCG/2ML IJ SOLN
INTRAMUSCULAR | Status: DC | PRN
  Administered 2017-06-11: 25 ug via INTRAVENOUS
  Administered 2017-06-11: 50 ug via INTRAVENOUS
  Administered 2017-06-11: 25 ug via INTRAVENOUS

## 2017-06-11 MED ORDER — CARBACHOL 0.01 % IO SOLN
INTRAOCULAR | Status: DC | PRN
  Administered 2017-06-11: 0.5 mL via INTRAOCULAR

## 2017-06-11 MED ORDER — ARMC OPHTHALMIC DILATING DROPS
1.0000 "application " | OPHTHALMIC | Status: AC
  Administered 2017-06-11 (×3): 1 via OPHTHALMIC

## 2017-06-11 MED ORDER — FENTANYL CITRATE (PF) 100 MCG/2ML IJ SOLN
INTRAMUSCULAR | Status: AC
  Filled 2017-06-11: qty 2

## 2017-06-11 MED ORDER — EPINEPHRINE PF 1 MG/ML IJ SOLN
INTRAOCULAR | Status: DC | PRN
  Administered 2017-06-11: 200 mL via OPHTHALMIC

## 2017-06-11 SURGICAL SUPPLY — 18 items
GLOVE BIO SURGEON STRL SZ8 (GLOVE) ×3 IMPLANT
GLOVE BIOGEL M 6.5 STRL (GLOVE) ×3 IMPLANT
GLOVE SURG LX 7.5 STRW (GLOVE) ×2
GLOVE SURG LX STRL 7.5 STRW (GLOVE) ×1 IMPLANT
GOWN STRL REUS W/ TWL LRG LVL3 (GOWN DISPOSABLE) ×2 IMPLANT
GOWN STRL REUS W/TWL LRG LVL3 (GOWN DISPOSABLE) ×4
LABEL CATARACT MEDS ST (LABEL) ×3 IMPLANT
LENS IOL TECNIS ITEC 20.0 (Intraocular Lens) ×3 IMPLANT
PACK CATARACT (MISCELLANEOUS) ×3 IMPLANT
PACK CATARACT BRASINGTON LX (MISCELLANEOUS) ×3 IMPLANT
PACK EYE AFTER SURG (MISCELLANEOUS) ×3 IMPLANT
SOL BAL SALT 15ML (MISCELLANEOUS) ×3
SOL BSS BAG (MISCELLANEOUS) ×3
SOLUTION BAL SALT 15ML (MISCELLANEOUS) ×1 IMPLANT
SOLUTION BSS BAG (MISCELLANEOUS) ×1 IMPLANT
SYR 5ML LL (SYRINGE) ×3 IMPLANT
WATER STERILE IRR 250ML POUR (IV SOLUTION) ×3 IMPLANT
WIPE NON LINTING 3.25X3.25 (MISCELLANEOUS) ×3 IMPLANT

## 2017-06-11 NOTE — H&P (Signed)
The History and Physical notes are on paper, have been signed, and are to be scanned. The patient remains stable and unchanged from the H&P.   Previous H&P reviewed, patient examined, and there are no changes.  Jessica Brooks 06/11/2017 7:32 AM   

## 2017-06-11 NOTE — Op Note (Signed)
OPERATIVE NOTE  Jessica GuntherRosa S Brooks [id number] 06/11/2017   PREOPERATIVE DIAGNOSIS:  Nuclear sclerotic cataract left eye. H25.12   POSTOPERATIVE DIAGNOSIS:    Nuclear sclerotic cataract left eye.     PROCEDURE:  Phacoemusification with posterior chamber intraocular lens placement of the left eye   LENS:   Implant Name Type Inv. Item Serial No. Manufacturer Lot No. LRB No. Used  LENS IOL DIOP 20.0 - [id number] Intraocular Lens LENS IOL DIOP 20.0 [id number]  Left 1        ULTRASOUND TIME: 15 % of 0 minutes, 34 seconds.  CDE 5.3   SURGEON:  Deirdre Evenerhadwick R. Lakresha Stifter, MD   ANESTHESIA:  Topical with tetracaine drops and 2% Xylocaine jelly, augmented with 1% preservative-free intracameral lidocaine.    COMPLICATIONS:  None.   DESCRIPTION OF PROCEDURE:  The patient was identified in the holding room and transported to the operating room and placed in the supine position under the operating microscope.  The left eye was identified as the operative eye and it was prepped and draped in the usual sterile ophthalmic fashion.   A 1 millimeter clear-corneal paracentesis was made at the 1:30 position. 0.5 ml of preservative-free 1% lidocaine was injected into the anterior chamber.  The anterior chamber was filled with Viscoat viscoelastic.  A 2.4 millimeter keratome was used to make a near-clear corneal incision at the 10:30 position.  .  A curvilinear capsulorrhexis was made with a cystotome and capsulorrhexis forceps.  Balanced salt solution was used to hydrodissect and hydrodelineate the nucleus.   Phacoemulsification was then used in stop and chop fashion to remove the lens nucleus and epinucleus.  The remaining cortex was then removed using the irrigation and aspiration handpiece. Provisc was then placed into the capsular bag to distend it for lens placement.  A lens was then injected into the capsular bag.  The remaining viscoelastic was aspirated.   Wounds were hydrated with balanced  salt solution.  The anterior chamber was inflated to a physiologic pressure with balanced salt solution. Vigamox 0.2 ml of a 1mg  per ml solution was injected into the anterior chamber for a dose of 0.2 mg of intracameral antibiotic at the completion of the case.  Miostat was placed into the anterior chamber to constrict the pupil.  No wound leaks were noted.  Topical Vigamox drops and Maxitrol ointment were applied to the eye.  The patient was taken to the recovery room in stable condition without complications of anesthesia or surgery  Jessica Brooks 06/11/2017, 8:27 AM

## 2017-06-11 NOTE — Discharge Instructions (Signed)

## 2017-06-11 NOTE — Anesthesia Postprocedure Evaluation (Signed)
Anesthesia Post Note  Patient: Jessica Brooks  Procedure(s) Performed: CATARACT EXTRACTION PHACO AND INTRAOCULAR LENS PLACEMENT (IOC)-LEFT (Left Eye)  Patient location during evaluation: PACU Anesthesia Type: MAC Level of consciousness: awake and alert and oriented Pain management: pain level controlled Vital Signs Assessment: post-procedure vital signs reviewed and stable Respiratory status: respiratory function stable Cardiovascular status: stable Anesthetic complications: no     Last Vitals:  Vitals:   06/11/17 0625  BP: (!) 160/97  Pulse: 99  Resp: 16  Temp: (!) 36.4 C  SpO2: 100%    Last Pain:  Vitals:   06/11/17 0625  TempSrc: Tympanic                 Blima Singer

## 2017-06-11 NOTE — Anesthesia Post-op Follow-up Note (Signed)
Anesthesia QCDR form completed.        

## 2017-06-11 NOTE — Addendum Note (Signed)
Addendum  created 06/11/17 0842 by Lenard SimmerKarenz, Arcenia Scarbro, MD   Intraprocedure Attestations filed

## 2017-06-11 NOTE — Transfer of Care (Signed)
Immediate Anesthesia Transfer of Care Note  Patient: Jessica Brooks  Procedure(s) Performed: CATARACT EXTRACTION PHACO AND INTRAOCULAR LENS PLACEMENT (IOC)-LEFT (Left Eye)  Patient Location: PACU  Anesthesia Type:MAC  Level of Consciousness: awake, alert  and oriented  Airway & Oxygen Therapy: Patient Spontanous Breathing  Post-op Assessment: Report given to RN and Post -op Vital signs reviewed and stable  Post vital signs: Reviewed and stable  Last Vitals:  Vitals:   06/11/17 0625  BP: (!) 160/97  Pulse: 99  Resp: 16  Temp: (!) 36.4 C  SpO2: 100%    Last Pain:  Vitals:   06/11/17 0625  TempSrc: Tympanic         Complications: No apparent anesthesia complications

## 2017-06-11 NOTE — Anesthesia Procedure Notes (Signed)
Procedure Name: MAC Performed by: Ahrianna Siglin, CRNA Pre-anesthesia Checklist: Patient identified, Emergency Drugs available, Suction available, Patient being monitored and Timeout performed Oxygen Delivery Method: Nasal cannula       

## 2017-06-11 NOTE — Anesthesia Preprocedure Evaluation (Signed)
Anesthesia Evaluation  Patient identified by MRN, date of birth, ID band Patient awake    Reviewed: Allergy & Precautions, H&P , NPO status , Patient's Chart, lab work & pertinent test results, reviewed documented beta blocker date and time   History of Anesthesia Complications Negative for: history of anesthetic complications  Airway Mallampati: I  TM Distance: >3 FB Neck ROM: full    Dental  (+) Dental Advidsory Given, Partial Upper, Partial Lower   Pulmonary neg shortness of breath, asthma , neg sleep apnea, neg COPD, neg recent URI, former smoker,           Cardiovascular Exercise Tolerance: Good hypertension, (-) angina(-) CAD, (-) Past MI, (-) Cardiac Stents and (-) CABG (-) dysrhythmias (-) Valvular Problems/Murmurs     Neuro/Psych negative neurological ROS  negative psych ROS   GI/Hepatic negative GI ROS, Neg liver ROS,   Endo/Other  neg diabetesMorbid obesity  Renal/GU negative Renal ROS  negative genitourinary   Musculoskeletal   Abdominal   Peds  Hematology negative hematology ROS (+)   Anesthesia Other Findings Past Medical History: No date: Arthritis No date: Asthma     Comment:  uses inhaler No date: Cataract No date: Hypertension No date: Keloid   Reproductive/Obstetrics negative OB ROS                             Anesthesia Physical Anesthesia Plan  ASA: III  Anesthesia Plan: MAC   Post-op Pain Management:    Induction:   PONV Risk Score and Plan: 2  Airway Management Planned: Simple Face Mask  Additional Equipment:   Intra-op Plan:   Post-operative Plan:   Informed Consent: I have reviewed the patients History and Physical, chart, labs and discussed the procedure including the risks, benefits and alternatives for the proposed anesthesia with the patient or authorized representative who has indicated his/her understanding and acceptance.   Dental  Advisory Given  Plan Discussed with: Anesthesiologist, CRNA and Surgeon  Anesthesia Plan Comments:         Anesthesia Quick Evaluation

## 2017-07-14 DIAGNOSIS — H33312 Horseshoe tear of retina without detachment, left eye: Secondary | ICD-10-CM | POA: Diagnosis not present

## 2017-07-23 DIAGNOSIS — Z961 Presence of intraocular lens: Secondary | ICD-10-CM | POA: Diagnosis not present

## 2017-09-23 ENCOUNTER — Ambulatory Visit: Payer: Medicare HMO | Admitting: Family Medicine

## 2017-09-25 ENCOUNTER — Encounter: Payer: Self-pay | Admitting: Family Medicine

## 2017-09-25 ENCOUNTER — Ambulatory Visit (INDEPENDENT_AMBULATORY_CARE_PROVIDER_SITE_OTHER): Payer: Medicare HMO | Admitting: Family Medicine

## 2017-09-25 VITALS — BP 137/85 | HR 100 | Temp 98.3°F | Wt 247.4 lb

## 2017-09-25 DIAGNOSIS — M170 Bilateral primary osteoarthritis of knee: Secondary | ICD-10-CM | POA: Diagnosis not present

## 2017-09-25 DIAGNOSIS — J309 Allergic rhinitis, unspecified: Secondary | ICD-10-CM

## 2017-09-25 MED ORDER — MONTELUKAST SODIUM 10 MG PO TABS: 10.0000 mg | ORAL_TABLET | Freq: Every day | ORAL | 1 refills | Status: DC

## 2017-09-25 MED ORDER — MUPIROCIN CALCIUM 2 % EX CREA: 1.0000 "application " | TOPICAL_CREAM | Freq: Two times a day (BID) | CUTANEOUS | 1 refills | Status: DC

## 2017-09-25 MED ORDER — MELOXICAM 15 MG PO TABS: 15.0000 mg | ORAL_TABLET | Freq: Every day | ORAL | 3 refills | Status: DC

## 2017-09-25 MED ORDER — PREDNISONE 20 MG PO TABS: 40.0000 mg | ORAL_TABLET | Freq: Every day | ORAL | 0 refills | Status: DC

## 2017-09-25 NOTE — Progress Notes (Signed)
BP 137/85 (BP Location: Right Arm, Patient Position: Sitting, Cuff Size: Normal)   Pulse 100   Temp 98.3 F (36.8 C) (Oral)   Wt 247 lb 6.4 oz (112.2 kg)   SpO2 100%   BMI 41.17 kg/m    Subjective:    Patient ID: Jessica Brooks, female    DOB: [birth year], 66 y.o.   MRN: [id number]  HPI: Jessica Brooks is a 67 y.o. female  Chief Complaint  Patient presents with  . Knee Pain    Bilateral. Patient states it used to be just her left knee. Now is both. Ongoing bilateral pain for 1 month. Sharp and stabbing pain.  . Medication Refill    Patient would like a refill on singulair and mupirocin cream 2%.   Pt here today with b/l knee pain worse the past few weeks. Pain is sharp, stabbing, worse with weight bearing especially stairs. Has had left knee pain chronically, got a joint injection last year which helped for quite some time but she never followed up for additional ones. Denies new injury, redness, heat swelling to the joints, fevers. Has been trying OTC pain relievers with minimal success.   Also needing refill on singulair, states that and the claritin seem to help keep her allergies at bay fairly well. Some breakthrough rhinorrhea but otherwise under good control.   Past Medical History:  Diagnosis Date  . Arthritis   . Asthma    uses inhaler  . Cataract   . Hypertension   . Keloid    Social History   Socioeconomic History  . Marital status: Divorced    Spouse name: Not on file  . Number of children: Not on file  . Years of education: Not on file  . Highest education level: Not on file  Occupational History  . Not on file  Social Needs  . Financial resource strain: Not on file  . Food insecurity:    Worry: Not on file    Inability: Not on file  . Transportation needs:    Medical: Not on file    Non-medical: Not on file  Tobacco Use  . Smoking status: Former Smoker    Last attempt to quit: 06/16/1978    Years since quitting: 39.3  . Smokeless  tobacco: Never Used  Substance and Sexual Activity  . Alcohol use: No  . Drug use: No  . Sexual activity: Never  Lifestyle  . Physical activity:    Days per week: Not on file    Minutes per session: Not on file  . Stress: Not on file  Relationships  . Social connections:    Talks on phone: Not on file    Gets together: Not on file    Attends religious service: Not on file    Active member of club or organization: Not on file    Attends meetings of clubs or organizations: Not on file    Relationship status: Not on file  . Intimate partner violence:    Fear of current or ex partner: Not on file    Emotionally abused: Not on file    Physically abused: Not on file    Forced sexual activity: Not on file  Other Topics Concern  . Not on file  Social History Narrative  . Not on file    Relevant past medical, surgical, family and social history reviewed and updated as indicated. Interim medical history since our last visit reviewed. Allergies and medications reviewed and updated.  Review of Systems  Per HPI unless specifically indicated above     Objective:    BP 137/85 (BP Location: Right Arm, Patient Position: Sitting, Cuff Size: Normal)   Pulse 100   Temp 98.3 F (36.8 C) (Oral)   Wt 247 lb 6.4 oz (112.2 kg)   SpO2 100%   BMI 41.17 kg/m   Wt Readings from Last 3 Encounters:  09/25/17 247 lb 6.4 oz (112.2 kg)  06/11/17 249 lb (112.9 kg)  04/08/17 249 lb 3.2 oz (113 kg)    Physical Exam  Constitutional: She is oriented to person, place, and time. She appears well-developed and well-nourished.  HENT:  Head: Atraumatic.  Right Ear: External ear normal.  Left Ear: External ear normal.  Mouth/Throat: Oropharynx is clear and moist.  Nasal mucosa mildly injected  Eyes: Pupils are equal, round, and reactive to light. EOM are normal.  Neck: Normal range of motion. Neck supple.  Cardiovascular: Normal rate and regular rhythm.  Pulmonary/Chest: Effort normal and breath  sounds normal.  Musculoskeletal: She exhibits edema and tenderness. She exhibits no deformity.  Crepitus with PROM b/l knees Minimal swelling b/l knees, ttp mostly at lateral joint line  Neurological: She is alert and oriented to person, place, and time.  Skin: Skin is warm and dry. No erythema.  Psychiatric: She has a normal mood and affect. Her behavior is normal.  Nursing note and vitals reviewed.   Results for orders placed or performed in visit on 12/14/15  Hepatitis C antibody  Result Value Ref Range   Hep C Virus Ab <0.1 0.0 - 0.9 s/co ratio  Comprehensive metabolic panel  Result Value Ref Range   Glucose 90 65 - 99 mg/dL   BUN 9 8 - 27 mg/dL   Creatinine, Ser 1.610.60 0.57 - 1.00 mg/dL   GFR calc non Af Amer 95 >59 mL/min/1.73   GFR calc Af Amer 110 >59 mL/min/1.73   BUN/Creatinine Ratio 15 12 - 28   Sodium 143 134 - 144 mmol/L   Potassium 4.6 3.5 - 5.2 mmol/L   Chloride 102 96 - 106 mmol/L   CO2 25 18 - 29 mmol/L   Calcium 9.4 8.7 - 10.3 mg/dL   Total Protein 7.0 6.0 - 8.5 g/dL   Albumin 3.9 3.6 - 4.8 g/dL   Globulin, Total 3.1 1.5 - 4.5 g/dL   Albumin/Globulin Ratio 1.3 1.2 - 2.2   Bilirubin Total 0.3 0.0 - 1.2 mg/dL   Alkaline Phosphatase 66 39 - 117 IU/L   AST 11 0 - 40 IU/L   ALT 14 0 - 32 IU/L  Lipid Panel w/o Chol/HDL Ratio  Result Value Ref Range   Cholesterol, Total 170 100 - 199 mg/dL   Triglycerides 76 0 - 149 mg/dL   HDL 62 >09>39 mg/dL   VLDL Cholesterol Cal 15 5 - 40 mg/dL   LDL Calculated 93 0 - 99 mg/dL  Microalbumin, Urine Waived  Result Value Ref Range   Microalb, Ur Waived 30 (H) 0 - 19 mg/L   Creatinine, Urine Waived 100 10 - 300 mg/dL   Microalb/Creat Ratio 30-300 (H) <30 mg/g      Assessment & Plan:   Problem List Items Addressed This Visit      Respiratory   Allergic rhinitis - Primary    Stable, continue current regimen        Musculoskeletal and Integument   Knee osteoarthritis    Pt very interested in resuming knee injections, in  meantime agreeable to  trying a prednisone burst and tylenol, then afterward starting prn meloxicam. Epsom salt soaks, heating pads, weight loss. Discussed trying glucosamine supplements as well      Relevant Medications   meloxicam (MOBIC) 15 MG tablet   predniSONE (DELTASONE) 20 MG tablet       Follow up plan: Return for 6 month f/u, b/l knee injections with Elnita Maxwell ASAP.

## 2017-09-25 NOTE — Patient Instructions (Signed)
While taking the prednisone for 5 days, take tylenol up to 1000 mg four times daily as needed  After completion of prednisone, can start the meloxicam daily as needed in addition to tylenol

## 2017-09-27 NOTE — Assessment & Plan Note (Signed)
Stable, continue current regimen 

## 2017-09-27 NOTE — Assessment & Plan Note (Signed)
Pt very interested in resuming knee injections, in meantime agreeable to trying a prednisone burst and tylenol, then afterward starting prn meloxicam. Epsom salt soaks, heating pads, weight loss. Discussed trying glucosamine supplements as well

## 2017-09-28 ENCOUNTER — Telehealth: Payer: Self-pay

## 2017-09-28 MED ORDER — MUPIROCIN 2 % EX OINT: 1.0000 "application " | TOPICAL_OINTMENT | Freq: Two times a day (BID) | CUTANEOUS | 0 refills | Status: DC

## 2017-09-28 NOTE — Telephone Encounter (Signed)
Fax from pharmacy. Insurance will only cover mupirocin ointment, not cream.  Gabriel Cirriheryl Wicker patient, you saw patient 09/25/2017

## 2017-09-28 NOTE — Telephone Encounter (Signed)
Rx modified

## 2017-10-08 DIAGNOSIS — J019 Acute sinusitis, unspecified: Secondary | ICD-10-CM | POA: Diagnosis not present

## 2017-10-08 DIAGNOSIS — J209 Acute bronchitis, unspecified: Secondary | ICD-10-CM | POA: Diagnosis not present

## 2017-10-13 ENCOUNTER — Ambulatory Visit (INDEPENDENT_AMBULATORY_CARE_PROVIDER_SITE_OTHER): Payer: Medicare HMO | Admitting: Unknown Physician Specialty

## 2017-10-13 ENCOUNTER — Encounter: Payer: Self-pay | Admitting: Unknown Physician Specialty

## 2017-10-13 VITALS — BP 161/99 | HR 87 | Temp 98.7°F | Ht 64.3 in | Wt 246.7 lb

## 2017-10-13 DIAGNOSIS — R6889 Other general symptoms and signs: Secondary | ICD-10-CM | POA: Diagnosis not present

## 2017-10-13 DIAGNOSIS — I10 Essential (primary) hypertension: Secondary | ICD-10-CM | POA: Diagnosis not present

## 2017-10-13 DIAGNOSIS — M25562 Pain in left knee: Secondary | ICD-10-CM

## 2017-10-13 DIAGNOSIS — M25561 Pain in right knee: Secondary | ICD-10-CM | POA: Diagnosis not present

## 2017-10-13 DIAGNOSIS — G8929 Other chronic pain: Secondary | ICD-10-CM

## 2017-10-13 DIAGNOSIS — R5383 Other fatigue: Secondary | ICD-10-CM | POA: Diagnosis not present

## 2017-10-13 MED ORDER — AMLODIPINE BESYLATE 5 MG PO TABS: 5.0000 mg | ORAL_TABLET | Freq: Every day | ORAL | 1 refills | Status: DC

## 2017-10-13 NOTE — Progress Notes (Signed)
BP (!) 161/99 (BP Location: Left Arm)   Pulse 87   Temp 98.7 F (37.1 C) (Oral)   Ht 5' 4.3" (1.633 m)   Wt 246 lb 11.2 oz (111.9 kg)   SpO2 97%   BMI 41.95 kg/m    Subjective:    Patient ID: Jessica Brooks, female    DOB: 03/17/1951, 67 y.o.   MRN: [id number]  HPI: Jessica Brooks is a 67 y.o. female  Chief Complaint  Patient presents with  . Knee Pain    pt wants bilateral knee injections, states left knee pain is worse than the right   Pt is here with complaints of knee pain.  She was seen on 4/12 and given Prednisone which has helped a little..  She described pain as sharp and stabbing with right greater than left.  She received injections last fall which helped and would like to get them again.  However, woke up feeling bad today.  BP elevated today.  ? Related to prednisone and NSAIDs.     Relevant past medical, surgical, family and social history reviewed and updated as indicated. Interim medical history since our last visit reviewed. Allergies and medications reviewed and updated.  Review of Systems  Constitutional: Negative.   Respiratory: Negative.   Cardiovascular: Negative.   Psychiatric/Behavioral: Negative.     Per HPI unless specifically indicated above     Objective:    BP (!) 161/99 (BP Location: Left Arm)   Pulse 87   Temp 98.7 F (37.1 C) (Oral)   Ht 5' 4.3" (1.633 m)   Wt 246 lb 11.2 oz (111.9 kg)   SpO2 97%   BMI 41.95 kg/m   Wt Readings from Last 3 Encounters:  10/13/17 246 lb 11.2 oz (111.9 kg)  09/25/17 247 lb 6.4 oz (112.2 kg)  06/11/17 249 lb (112.9 kg)    Physical Exam  Constitutional: She is oriented to person, place, and time. She appears well-developed and well-nourished. No distress.  HENT:  Head: Normocephalic and atraumatic.  Eyes: Conjunctivae and lids are normal. Right eye exhibits no discharge. Left eye exhibits no discharge. No scleral icterus.  Cardiovascular: Normal rate, regular rhythm and normal heart  sounds.  Pulmonary/Chest: Effort normal and breath sounds normal.  Abdominal: Normal appearance. There is no splenomegaly or hepatomegaly.  Musculoskeletal: Normal range of motion.  Neurological: She is alert and oriented to person, place, and time.  Skin: Skin is intact. No rash noted. No pallor.  Psychiatric: She has a normal mood and affect. Her behavior is normal. Judgment and thought content normal.    Results for orders placed or performed in visit on 12/14/15  Hepatitis C antibody  Result Value Ref Range   Hep C Virus Ab <0.1 0.0 - 0.9 s/co ratio  Comprehensive metabolic panel  Result Value Ref Range   Glucose 90 65 - 99 mg/dL   BUN 9 8 - 27 mg/dL   Creatinine, Ser 4.09 0.57 - 1.00 mg/dL   GFR calc non Af Amer 95 >59 mL/min/1.73   GFR calc Af Amer 110 >59 mL/min/1.73   BUN/Creatinine Ratio 15 12 - 28   Sodium 143 134 - 144 mmol/L   Potassium 4.6 3.5 - 5.2 mmol/L   Chloride 102 96 - 106 mmol/L   CO2 25 18 - 29 mmol/L   Calcium 9.4 8.7 - 10.3 mg/dL   Total Protein 7.0 6.0 - 8.5 g/dL   Albumin 3.9 3.6 - 4.8 g/dL   Globulin, Total 3.1  1.5 - 4.5 g/dL   Albumin/Globulin Ratio 1.3 1.2 - 2.2   Bilirubin Total 0.3 0.0 - 1.2 mg/dL   Alkaline Phosphatase 66 39 - 117 IU/L   AST 11 0 - 40 IU/L   ALT 14 0 - 32 IU/L  Lipid Panel w/o Chol/HDL Ratio  Result Value Ref Range   Cholesterol, Total 170 100 - 199 mg/dL   Triglycerides 76 0 - 149 mg/dL   HDL 62 >40 mg/dL   VLDL Cholesterol Cal 15 5 - 40 mg/dL   LDL Calculated 93 0 - 99 mg/dL  Microalbumin, Urine Waived  Result Value Ref Range   Microalb, Ur Waived 30 (H) 0 - 19 mg/L   Creatinine, Urine Waived 100 10 - 300 mg/dL   Microalb/Creat Ratio 30-300 (H) <30 mg/g      Assessment & Plan:   Problem List Items Addressed This Visit      Unprioritized   Bilateral knee pain    Will not give injections today as it seems prednisone is making her feel bad.  Stop Prednisone and will give injections on Monday if improved.         Hypertension    New for her.  ? Secondary to Prednisone.  Stop prednisone.  Rx for Amlodipine 5 mg that is perhaps temporary.  Recheck on Monday      Relevant Medications   amLODipine (NORVASC) 5 MG tablet   Other Relevant Orders   CBC with Differential/Platelet   Comprehensive metabolic panel   Lipid Panel w/o Chol/HDL Ratio    Other Visit Diagnoses    Fatigue, unspecified type    -  Primary   Suspect related to prednisone.  But, check labs to f/u a more acute process.     Relevant Orders   TSH       Follow up plan: Return for Monday for knee inj.  30 minutes.

## 2017-10-13 NOTE — Assessment & Plan Note (Signed)
New for her.  ? Secondary to Prednisone.  Stop prednisone.  Rx for Amlodipine 5 mg that is perhaps temporary.  Recheck on Monday

## 2017-10-13 NOTE — Assessment & Plan Note (Signed)
Will not give injections today as it seems prednisone is making her feel bad.  Stop Prednisone and will give injections on Monday if improved.

## 2017-10-14 ENCOUNTER — Encounter: Payer: Self-pay | Admitting: Unknown Physician Specialty

## 2017-10-14 LAB — CBC WITH DIFFERENTIAL/PLATELET
BASOS ABS: 0 10*3/uL (ref 0.0–0.2)
Basos: 0 %
EOS (ABSOLUTE): 0 10*3/uL (ref 0.0–0.4)
Eos: 0 %
HEMOGLOBIN: 13 g/dL (ref 11.1–15.9)
Hematocrit: 40.2 % (ref 34.0–46.6)
IMMATURE GRANS (ABS): 0.1 10*3/uL (ref 0.0–0.1)
IMMATURE GRANULOCYTES: 1 %
LYMPHS: 23 %
Lymphocytes Absolute: 1.8 10*3/uL (ref 0.7–3.1)
MCH: 27.5 pg (ref 26.6–33.0)
MCHC: 32.3 g/dL (ref 31.5–35.7)
MCV: 85 fL (ref 79–97)
Monocytes Absolute: 0.6 10*3/uL (ref 0.1–0.9)
Monocytes: 8 %
NEUTROS ABS: 5.1 10*3/uL (ref 1.4–7.0)
NEUTROS PCT: 68 %
PLATELETS: 319 10*3/uL (ref 150–379)
RBC: 4.72 x10E6/uL (ref 3.77–5.28)
RDW: 15 % (ref 12.3–15.4)
WBC: 7.5 10*3/uL (ref 3.4–10.8)

## 2017-10-14 LAB — COMPREHENSIVE METABOLIC PANEL
ALBUMIN: 4 g/dL (ref 3.6–4.8)
ALT: 25 IU/L (ref 0–32)
AST: 12 IU/L (ref 0–40)
Albumin/Globulin Ratio: 1.3 (ref 1.2–2.2)
Alkaline Phosphatase: 69 IU/L (ref 39–117)
BUN / CREAT RATIO: 18 (ref 12–28)
BUN: 11 mg/dL (ref 8–27)
Bilirubin Total: 0.2 mg/dL (ref 0.0–1.2)
CALCIUM: 9.1 mg/dL (ref 8.7–10.3)
CO2: 25 mmol/L (ref 20–29)
CREATININE: 0.62 mg/dL (ref 0.57–1.00)
Chloride: 101 mmol/L (ref 96–106)
GFR calc non Af Amer: 93 mL/min/{1.73_m2} (ref >59)
GFR, EST AFRICAN AMERICAN: 107 mL/min/{1.73_m2} (ref >59)
GLUCOSE: 92 mg/dL (ref 65–99)
Globulin, Total: 3.1 g/dL (ref 1.5–4.5)
Potassium: 4 mmol/L (ref 3.5–5.2)
Sodium: 139 mmol/L (ref 134–144)
TOTAL PROTEIN: 7.1 g/dL (ref 6.0–8.5)

## 2017-10-14 LAB — LIPID PANEL W/O CHOL/HDL RATIO
Cholesterol, Total: 165 mg/dL (ref 100–199)
HDL: 56 mg/dL (ref >39)
LDL CALC: 94 mg/dL (ref 0–99)
Triglycerides: 73 mg/dL (ref 0–149)
VLDL CHOLESTEROL CAL: 15 mg/dL (ref 5–40)

## 2017-10-14 LAB — TSH: TSH: 0.406 u[IU]/mL — ABNORMAL LOW (ref 0.450–4.500)

## 2017-10-19 ENCOUNTER — Ambulatory Visit (INDEPENDENT_AMBULATORY_CARE_PROVIDER_SITE_OTHER): Payer: Medicare HMO | Admitting: Unknown Physician Specialty

## 2017-10-19 ENCOUNTER — Encounter: Payer: Self-pay | Admitting: Unknown Physician Specialty

## 2017-10-19 VITALS — BP 139/90 | HR 89 | Temp 98.4°F | Ht 64.0 in | Wt 244.0 lb

## 2017-10-19 DIAGNOSIS — M1712 Unilateral primary osteoarthritis, left knee: Secondary | ICD-10-CM | POA: Diagnosis not present

## 2017-10-19 DIAGNOSIS — L910 Hypertrophic scar: Secondary | ICD-10-CM | POA: Diagnosis not present

## 2017-10-19 DIAGNOSIS — R7989 Other specified abnormal findings of blood chemistry: Secondary | ICD-10-CM | POA: Diagnosis not present

## 2017-10-19 DIAGNOSIS — I10 Essential (primary) hypertension: Secondary | ICD-10-CM | POA: Diagnosis not present

## 2017-10-19 DIAGNOSIS — M170 Bilateral primary osteoarthritis of knee: Secondary | ICD-10-CM | POA: Diagnosis not present

## 2017-10-19 DIAGNOSIS — R6889 Other general symptoms and signs: Secondary | ICD-10-CM | POA: Diagnosis not present

## 2017-10-19 MED ORDER — HYDROCHLOROTHIAZIDE 25 MG PO TABS: 25.0000 mg | ORAL_TABLET | Freq: Every day | ORAL | 3 refills | Status: DC

## 2017-10-19 MED ORDER — MUPIROCIN 2 % EX OINT: 1.0000 "application " | TOPICAL_OINTMENT | Freq: Two times a day (BID) | CUTANEOUS | 0 refills | Status: DC

## 2017-10-19 MED ORDER — TRIAMCINOLONE ACETONIDE 40 MG/ML IJ SUSP
40.0000 mg | Freq: Once | INTRAMUSCULAR | Status: AC
  Administered 2017-10-19: 40 mg via INTRAMUSCULAR

## 2017-10-19 NOTE — Assessment & Plan Note (Signed)
Pt will cut back on Meloxicam after injection.  Encouraged walking as much as possible.  Refer to Orthopedics

## 2017-10-19 NOTE — Assessment & Plan Note (Signed)
Refilled Bactroban

## 2017-10-19 NOTE — Assessment & Plan Note (Signed)
Low TSH last blood draw.  ? Contributing to hypertension.  Will recheck in 1 month TSH and T4

## 2017-10-19 NOTE — Assessment & Plan Note (Addendum)
Amlodipine causing nausea.  Will change to HCTZ.  Encouraged high K diet.  Encouraged to cut back on Meloxicam as much as possible

## 2017-10-19 NOTE — Progress Notes (Signed)
BP 139/90 (BP Location: Left Arm, Cuff Size: Large)   Pulse 89   Temp 98.4 F (36.9 C) (Oral)   Ht  (1.626 m)   Wt 244 lb (110.7 kg)   SpO2 97%   BMI 41.88 kg/m    Subjective:    Patient ID: Jessica Brooks, female    DOB: 1951-06-14, 67 y.o.   MRN: [id number]  HPI: Jessica Brooks is a 67 y.o. female  Chief Complaint  Patient presents with  . Knee Pain   Hypertension Started on Amlodipine 5 mg last visit.  This is making her nauseated all day Average home BPs  Not checking  No problems or lightheadedness No chest pain with exertion or shortness of breath No Edema  Knee pain Pt would like a left knee injection.  Both bother her but the left hurts more than right.    Keloids Become inflamed at times. Uses Bactroban prn which helps.  She would like a refills   Relevant past medical, surgical, family and social history reviewed and updated as indicated. Interim medical history since our last visit reviewed. Allergies and medications reviewed and updated.  Review of Systems  Per HPI unless specifically indicated above     Objective:    BP 139/90 (BP Location: Left Arm, Cuff Size: Large)   Pulse 89   Temp 98.4 F (36.9 C) (Oral)   Ht  (1.626 m)   Wt 244 lb (110.7 kg)   SpO2 97%   BMI 41.88 kg/m   Wt Readings from Last 3 Encounters:  10/19/17 244 lb (110.7 kg)  10/13/17 246 lb 11.2 oz (111.9 kg)  09/25/17 247 lb 6.4 oz (112.2 kg)    Physical Exam  Constitutional: She is oriented to person, place, and time. She appears well-developed and well-nourished. No distress.  HENT:  Head: Normocephalic and atraumatic.  Eyes: Conjunctivae and lids are normal. Right eye exhibits no discharge. Left eye exhibits no discharge. No scleral icterus.  Cardiovascular: Normal rate.  Pulmonary/Chest: Effort normal.  Abdominal: Normal appearance. There is no splenomegaly or hepatomegaly.  Musculoskeletal:       Left knee: She exhibits decreased range of  motion and swelling. She exhibits no erythema, no LCL laxity and no MCL laxity.  Neurological: She is alert and oriented to person, place, and time.  Skin: Skin is intact. No rash noted. No pallor.  Psychiatric: She has a normal mood and affect. Her behavior is normal. Judgment and thought content normal.   STEROID INJECTION  Procedure: Knee Intraarticular Steroid Injection   Description: After verbal consent and patient ed on Area prepped and draped using  semi-sterile technique. Using a anterior  approach, a mixture of 4 cc of  1% Marcaine & 1 cc of Kenalog 40 was injected into knee joint.  A bandage was then placed over the injection site. Complications:  none Post Procedure Instructions: To the ER if any symptoms of erythema or swelling.   Follow Up: PRN      Assessment & Plan:   Problem List Items Addressed This Visit      Unprioritized   Hypertension    Amlodipine causing nausea.  Will change to HCTZ.  Encouraged high K diet.  Encouraged to cut back on Meloxicam as much as possible      Relevant Medications   hydrochlorothiazide (HYDRODIURIL) 25 MG tablet   Keloid    Refilled Bactroban      Knee osteoarthritis - Primary    Pt  will cut back on Meloxicam after injection.  Encouraged walking as much as possible.  Refer to Orthopedics      Relevant Medications   triamcinolone acetonide (KENALOG-40) injection 40 mg (Completed) (Start on 10/19/2017 10:15 AM)   Other Relevant Orders   Ambulatory referral to Orthopedic Surgery   Low TSH level    Low TSH last blood draw.  ? Contributing to hypertension.  Will recheck in 1 month TSH and T4          Follow up plan: Return in about 1 month (around 11/16/2017).

## 2017-11-23 ENCOUNTER — Ambulatory Visit (INDEPENDENT_AMBULATORY_CARE_PROVIDER_SITE_OTHER): Payer: Medicare HMO | Admitting: Unknown Physician Specialty

## 2017-11-23 ENCOUNTER — Encounter: Payer: Self-pay | Admitting: Unknown Physician Specialty

## 2017-11-23 DIAGNOSIS — R6889 Other general symptoms and signs: Secondary | ICD-10-CM | POA: Diagnosis not present

## 2017-11-23 DIAGNOSIS — I10 Essential (primary) hypertension: Secondary | ICD-10-CM | POA: Diagnosis not present

## 2017-11-23 DIAGNOSIS — R7989 Other specified abnormal findings of blood chemistry: Secondary | ICD-10-CM | POA: Diagnosis not present

## 2017-11-23 MED ORDER — AMLODIPINE BESYLATE 5 MG PO TABS: 5.0000 mg | ORAL_TABLET | Freq: Every day | ORAL | 1 refills | Status: DC

## 2017-11-23 NOTE — Assessment & Plan Note (Addendum)
Tolerating Amlodipine 5 mg with good control.  Will continue present dose.  Able to cut back on Meloxicam

## 2017-11-23 NOTE — Progress Notes (Signed)
BP 121/79   Pulse 94   Temp 98.4 F (36.9 C) (Oral)   Ht 5\' 4"  (1.626 m)   Wt 244 lb 12.8 oz (111 kg)   SpO2 97%   BMI 42.02 kg/m    Subjective:    Patient ID: Jessica Brooks, female    DOB: Feb 01, 1951, 67 y.o.   MRN: [id number]  HPI: Jessica Brooks is a 67 y.o. female  Chief Complaint  Patient presents with  . Hypertension    1 month f/up- pt stopped HCTZ. States it made her heart race and she felt jittery (added to intolerance/allergy list). States she started back taking amlodipine 5 mg daily  . Hypothyroidism    1 month f/up   Hypertension Pt did not tolerated HCTZ due to nausea, jittery, fatigue and heart racing.  Started back on the Amlodipine which was stopped due to nausea but now tolerating well Average home BPs: Not checking   No problems or lightheadedness No chest pain with exertion or shortness of breath No Edema  Low TSH Noticed the visit before last.  Recheck today   Relevant past medical, surgical, family and social history reviewed and updated as indicated. Interim medical history since our last visit reviewed. Allergies and medications reviewed and updated.  Review of Systems  Per HPI unless specifically indicated above     Objective:    BP 121/79   Pulse 94   Temp 98.4 F (36.9 C) (Oral)   Ht 5\' 4"  (1.626 m)   Wt 244 lb 12.8 oz (111 kg)   SpO2 97%   BMI 42.02 kg/m   Wt Readings from Last 3 Encounters:  11/23/17 244 lb 12.8 oz (111 kg)  10/19/17 244 lb (110.7 kg)  10/13/17 246 lb 11.2 oz (111.9 kg)    Physical Exam  Constitutional: She is oriented to person, place, and time. She appears well-developed and well-nourished. No distress.  HENT:  Head: Normocephalic and atraumatic.  Eyes: Conjunctivae and lids are normal. Right eye exhibits no discharge. Left eye exhibits no discharge. No scleral icterus.  Neck: Normal range of motion. Neck supple. No JVD present. Carotid bruit is not present.  Cardiovascular: Normal rate,  regular rhythm and normal heart sounds.  Pulmonary/Chest: Effort normal and breath sounds normal.  Abdominal: Normal appearance. There is no splenomegaly or hepatomegaly.  Musculoskeletal: Normal range of motion.  Neurological: She is alert and oriented to person, place, and time.  Skin: Skin is warm, dry and intact. No rash noted. No pallor.  Psychiatric: She has a normal mood and affect. Her behavior is normal. Judgment and thought content normal.    Results for orders placed or performed in visit on 10/13/17  CBC with Differential/Platelet  Result Value Ref Range   WBC 7.5 3.4 - 10.8 x10E3/uL   RBC 4.72 3.77 - 5.28 x10E6/uL   Hemoglobin 13.0 11.1 - 15.9 g/dL   Hematocrit 40.9 81.1 - 46.6 %   MCV 85 79 - 97 fL   MCH 27.5 26.6 - 33.0 pg   MCHC 32.3 31.5 - 35.7 g/dL   RDW 91.4 78.2 - 95.6 %   Platelets 319 150 - 379 x10E3/uL   Neutrophils 68 Not Estab. %   Lymphs 23 Not Estab. %   Monocytes 8 Not Estab. %   Eos 0 Not Estab. %   Basos 0 Not Estab. %   Neutrophils Absolute 5.1 1.4 - 7.0 x10E3/uL   Lymphocytes Absolute 1.8 0.7 - 3.1 x10E3/uL   Monocytes  Absolute 0.6 0.1 - 0.9 x10E3/uL   EOS (ABSOLUTE) 0.0 0.0 - 0.4 x10E3/uL   Basophils Absolute 0.0 0.0 - 0.2 x10E3/uL   Immature Granulocytes 1 Not Estab. %   Immature Grans (Abs) 0.1 0.0 - 0.1 x10E3/uL  Comprehensive metabolic panel  Result Value Ref Range   Glucose 92 65 - 99 mg/dL   BUN 11 8 - 27 mg/dL   Creatinine, Ser 1.610.62 0.57 - 1.00 mg/dL   GFR calc non Af Amer 93 >59 mL/min/1.73   GFR calc Af Amer 107 >59 mL/min/1.73   BUN/Creatinine Ratio 18 12 - 28   Sodium 139 134 - 144 mmol/L   Potassium 4.0 3.5 - 5.2 mmol/L   Chloride 101 96 - 106 mmol/L   CO2 25 20 - 29 mmol/L   Calcium 9.1 8.7 - 10.3 mg/dL   Total Protein 7.1 6.0 - 8.5 g/dL   Albumin 4.0 3.6 - 4.8 g/dL   Globulin, Total 3.1 1.5 - 4.5 g/dL   Albumin/Globulin Ratio 1.3 1.2 - 2.2   Bilirubin Total 0.2 0.0 - 1.2 mg/dL   Alkaline Phosphatase 69 39 - 117 IU/L    AST 12 0 - 40 IU/L   ALT 25 0 - 32 IU/L  Lipid Panel w/o Chol/HDL Ratio  Result Value Ref Range   Cholesterol, Total 165 100 - 199 mg/dL   Triglycerides 73 0 - 149 mg/dL   HDL 56 >09>39 mg/dL   VLDL Cholesterol Cal 15 5 - 40 mg/dL   LDL Calculated 94 0 - 99 mg/dL  TSH  Result Value Ref Range   TSH 0.406 (L) 0.450 - 4.500 uIU/mL      Assessment & Plan:   Problem List Items Addressed This Visit      Unprioritized   Hypertension    Tolerating Amlodipine 5 mg with good control.  Will continue present dose.  Able to cut back on Meloxicam      Relevant Medications   amLODipine (NORVASC) 5 MG tablet   Other Relevant Orders   TSH   Low TSH level    Recheck TSH and T4      Relevant Orders   TSH       Follow up plan: Return for Recheck October for physical.

## 2017-11-23 NOTE — Assessment & Plan Note (Signed)
Recheck TSH and T4 

## 2017-11-24 ENCOUNTER — Other Ambulatory Visit: Payer: Self-pay | Admitting: Unknown Physician Specialty

## 2017-11-24 DIAGNOSIS — R7989 Other specified abnormal findings of blood chemistry: Secondary | ICD-10-CM

## 2017-11-24 LAB — TSH: TSH: 0.292 u[IU]/mL — ABNORMAL LOW (ref 0.450–4.500)

## 2017-12-02 DIAGNOSIS — R269 Unspecified abnormalities of gait and mobility: Secondary | ICD-10-CM | POA: Diagnosis not present

## 2017-12-02 DIAGNOSIS — R6889 Other general symptoms and signs: Secondary | ICD-10-CM | POA: Diagnosis not present

## 2017-12-02 DIAGNOSIS — M170 Bilateral primary osteoarthritis of knee: Secondary | ICD-10-CM | POA: Diagnosis not present

## 2017-12-02 DIAGNOSIS — M25551 Pain in right hip: Secondary | ICD-10-CM | POA: Diagnosis not present

## 2017-12-02 DIAGNOSIS — M25562 Pain in left knee: Secondary | ICD-10-CM | POA: Diagnosis not present

## 2018-04-02 ENCOUNTER — Ambulatory Visit (INDEPENDENT_AMBULATORY_CARE_PROVIDER_SITE_OTHER): Payer: Medicare HMO | Admitting: Unknown Physician Specialty

## 2018-04-02 ENCOUNTER — Encounter: Payer: Self-pay | Admitting: Unknown Physician Specialty

## 2018-04-02 VITALS — BP 139/94 | HR 92 | Ht 63.78 in | Wt 242.8 lb

## 2018-04-02 DIAGNOSIS — Z7189 Other specified counseling: Secondary | ICD-10-CM | POA: Diagnosis not present

## 2018-04-02 DIAGNOSIS — Z23 Encounter for immunization: Secondary | ICD-10-CM | POA: Diagnosis not present

## 2018-04-02 DIAGNOSIS — R7989 Other specified abnormal findings of blood chemistry: Secondary | ICD-10-CM

## 2018-04-02 DIAGNOSIS — I10 Essential (primary) hypertension: Secondary | ICD-10-CM

## 2018-04-02 DIAGNOSIS — Z1239 Encounter for other screening for malignant neoplasm of breast: Secondary | ICD-10-CM | POA: Diagnosis not present

## 2018-04-02 DIAGNOSIS — L910 Hypertrophic scar: Secondary | ICD-10-CM | POA: Diagnosis not present

## 2018-04-02 DIAGNOSIS — Z0000 Encounter for general adult medical examination without abnormal findings: Secondary | ICD-10-CM | POA: Diagnosis not present

## 2018-04-02 NOTE — Progress Notes (Signed)
BP (!) 139/94   Pulse 92   Ht 5' 3.78" (1.62 m)   Wt 242 lb 12.8 oz (110.1 kg)   SpO2 95%   BMI 41.96 kg/m    Subjective:    Patient ID: Jessica Brooks, female    DOB: February 25, 1951, 67 y.o.   MRN: [id number]  HPI: Jessica Brooks is a 67 y.o. female  Chief Complaint  Patient presents with  . Annual Exam   Hypertension Using medications without difficulty Average home BPs Not checking   No problems or lightheadedness No chest pain with exertion or shortness of breath No Edema  Suppressed TSH Referred to endocrine but never went due phone number confusion.  States she gets "jittery" and trouble sleeping at night.    The 10-year ASCVD risk score Denman George DC Montez Hageman., et al., 2013) is: 10.7%   Values used to calculate the score:     Age: 49 years     Sex: Female     Is Non-Hispanic African American: Yes     Diabetic: No     Tobacco smoker: No     Systolic Blood Pressure: 139 mmHg     Is BP treated: Yes     HDL Cholesterol: 56 mg/dL     Total Cholesterol: 165 mg/dL  Relevant past medical, surgical, family and social history reviewed and updated as indicated. Interim medical history since our last visit reviewed. Allergies and medications reviewed and updated.  Review of Systems  Constitutional: Negative.   HENT: Negative.   Eyes: Negative.   Respiratory: Negative.   Cardiovascular: Negative.   Gastrointestinal: Negative.   Endocrine: Negative.   Genitourinary: Negative.   Musculoskeletal: Negative.   Skin: Negative.   Allergic/Immunologic: Negative.   Neurological: Negative.   Hematological: Negative.   Psychiatric/Behavioral: Negative.     Per HPI unless specifically indicated above     Objective:    BP (!) 139/94   Pulse 92   Ht 5' 3.78" (1.62 m)   Wt 242 lb 12.8 oz (110.1 kg)   SpO2 95%   BMI 41.96 kg/m   Wt Readings from Last 3 Encounters:  04/02/18 242 lb 12.8 oz (110.1 kg)  11/23/17 244 lb 12.8 oz (111 kg)  10/19/17 244 lb (110.7 kg)    Physical Exam  Constitutional: She is oriented to person, place, and time. She appears well-developed and well-nourished.  HENT:  Head: Normocephalic and atraumatic.  Eyes: Pupils are equal, round, and reactive to light. Right eye exhibits no discharge. Left eye exhibits no discharge. No scleral icterus.  Neck: Normal range of motion. Neck supple. Carotid bruit is not present. No thyromegaly present.  Cardiovascular: Normal rate, regular rhythm and normal heart sounds. Exam reveals no gallop and no friction rub.  No murmur heard. Pulmonary/Chest: Effort normal and breath sounds normal. No respiratory distress. She has no wheezes. She has no rales. No breast tenderness or discharge.  Abdominal: Soft. Bowel sounds are normal. There is no tenderness. There is no rebound.  Genitourinary: No breast tenderness or discharge.  Musculoskeletal: Normal range of motion.  Lymphadenopathy:    She has no cervical adenopathy.  Neurological: She is alert and oriented to person, place, and time.  Skin: Skin is warm, dry and intact. No rash noted.  Large Keloid on chest wall.  She thinks if is getting larger  Psychiatric: She has a normal mood and affect. Her speech is normal and behavior is normal. Judgment and thought content normal. Cognition and memory are  normal.    Results for orders placed or performed in visit on 11/23/17  TSH  Result Value Ref Range   TSH 0.292 (L) 0.450 - 4.500 uIU/mL      Assessment & Plan:   Problem List Items Addressed This Visit      Unprioritized   Advanced directives, counseling/discussion    A voluntary discussion about advance care planning including the explanation and discussion of advance directives was extensively discussed  with the patient.  Explanation about the health care proxy and Living will was reviewed and packet with forms with explanation of how to fill them out was given.  During this discussion, the patient was able to identify a health care proxy  as her youngest daughter and thinks she has  filled out the paperwork required.  Patient was offered a separate Advance Care Planning visit for further assistance with forms.  She will bring in a copy.  Time spent: 20      Individuals present patient       Hypertension    Not to goal.  Discussed DASH diet.  Check labs      Relevant Orders   Comprehensive metabolic panel   Lipid Panel w/o Chol/HDL Ratio   Keloid    Refer to dermatology for a large keloid      Relevant Orders   Ambulatory referral to Dermatology   Low TSH level    Lost to f/u.  Pt symptomatic.  Will check TSH and T4 today.  Will put in another referral to Endocrine      Relevant Orders   Ambulatory referral to Endocrinology   Thyroid Panel With TSH    Other Visit Diagnoses    Needs flu shot    -  Primary   Relevant Orders   Flu vaccine HIGH DOSE PF (Fluzone High dose) (Completed)   Annual physical exam       Breast cancer screening       Relevant Orders   MM Digital Screening       Follow up plan: Return in about 6 months (around 10/02/2018).

## 2018-04-02 NOTE — Assessment & Plan Note (Signed)
Refer to dermatology for a large keloid

## 2018-04-02 NOTE — Assessment & Plan Note (Signed)
Not to goal.  Discussed DASH diet.  Check labs

## 2018-04-02 NOTE — Assessment & Plan Note (Addendum)
Lost to f/u.  Pt symptomatic.  Will check TSH and T4 today.  Will put in another referral to Endocrine

## 2018-04-02 NOTE — Assessment & Plan Note (Addendum)
A voluntary discussion about advance care planning including the explanation and discussion of advance directives was extensively discussed  with the patient.  Explanation about the health care proxy and Living will was reviewed and packet with forms with explanation of how to fill them out was given.  During this discussion, the patient was able to identify a health care proxy as her youngest daughter and thinks she has  filled out the paperwork required.  Patient was offered a separate Advance Care Planning visit for further assistance with forms.  She will bring in a copy.  Time spent: 20      Individuals present patient

## 2018-04-02 NOTE — Patient Instructions (Addendum)

## 2018-04-03 LAB — COMPREHENSIVE METABOLIC PANEL
ALBUMIN: 4 g/dL (ref 3.6–4.8)
ALT: 16 IU/L (ref 0–32)
AST: 13 IU/L (ref 0–40)
Albumin/Globulin Ratio: 1.4 (ref 1.2–2.2)
Alkaline Phosphatase: 70 IU/L (ref 39–117)
BILIRUBIN TOTAL: 0.3 mg/dL (ref 0.0–1.2)
BUN / CREAT RATIO: 23 (ref 12–28)
BUN: 13 mg/dL (ref 8–27)
CO2: 24 mmol/L (ref 20–29)
CREATININE: 0.57 mg/dL (ref 0.57–1.00)
Calcium: 9.2 mg/dL (ref 8.7–10.3)
Chloride: 102 mmol/L (ref 96–106)
GFR calc non Af Amer: 96 mL/min/{1.73_m2} (ref >59)
GFR, EST AFRICAN AMERICAN: 110 mL/min/{1.73_m2} (ref >59)
Globulin, Total: 2.9 g/dL (ref 1.5–4.5)
Glucose: 81 mg/dL (ref 65–99)
Potassium: 4.7 mmol/L (ref 3.5–5.2)
Sodium: 140 mmol/L (ref 134–144)
TOTAL PROTEIN: 6.9 g/dL (ref 6.0–8.5)

## 2018-04-03 LAB — LIPID PANEL W/O CHOL/HDL RATIO
Cholesterol, Total: 163 mg/dL (ref 100–199)
HDL: 55 mg/dL (ref >39)
LDL Calculated: 89 mg/dL (ref 0–99)
Triglycerides: 93 mg/dL (ref 0–149)
VLDL Cholesterol Cal: 19 mg/dL (ref 5–40)

## 2018-04-03 LAB — THYROID PANEL WITH TSH
FREE THYROXINE INDEX: 2.1 (ref 1.2–4.9)
T3 UPTAKE RATIO: 31 % (ref 24–39)
T4 TOTAL: 6.8 ug/dL (ref 4.5–12.0)
TSH: 0.36 u[IU]/mL — AB (ref 0.450–4.500)

## 2018-04-09 ENCOUNTER — Encounter: Payer: Self-pay | Admitting: Unknown Physician Specialty

## 2018-04-09 ENCOUNTER — Ambulatory Visit: Payer: Self-pay

## 2018-04-12 ENCOUNTER — Other Ambulatory Visit: Payer: Self-pay

## 2018-04-12 MED ORDER — MELOXICAM 15 MG PO TABS: 15.0000 mg | ORAL_TABLET | Freq: Every day | ORAL | 3 refills | Status: DC

## 2018-04-12 NOTE — Telephone Encounter (Signed)
Request for 90 day supply on meloxicam. Previous [facility] patient. Last visit 04/02/2018  Next visit 04/09/2018 [facility] and 10/04/2017 with Jolene.

## 2018-04-12 NOTE — Telephone Encounter (Signed)
Refill request approved for Meloxicam.  Patient next appointment with provider in April.

## 2018-04-26 ENCOUNTER — Other Ambulatory Visit: Payer: Self-pay | Admitting: Unknown Physician Specialty

## 2018-04-26 MED ORDER — MONTELUKAST SODIUM 10 MG PO TABS: 10.0000 mg | ORAL_TABLET | Freq: Every day | ORAL | 3 refills | Status: DC

## 2018-04-26 NOTE — Telephone Encounter (Signed)
Copied from CRM [id number]. Topic: Quick Communication - See Telephone Encounter >> Apr 26, 2018 10:25 AM Windy Kalata, NT wrote: CRM for notification. See Telephone encounter for: 04/26/18.  Patient would like a refill on montelukast (SINGULAIR) 10 MG tablet.  [facility] [address] [place] - [address] [place] [address] Phone: [phone] Fax: [phone]

## 2018-06-30 ENCOUNTER — Other Ambulatory Visit: Payer: Self-pay | Admitting: Unknown Physician Specialty

## 2018-06-30 NOTE — Telephone Encounter (Signed)
Requested medication (s) are due for refill today - yes  Requested medication (s) are on the active medication list -yes  Future visit scheduled -yes  Last refill: 11/23/17  Notes to clinic: Patient is [facility] patient- she has appointment scheduled with [facility] in April- sent for change of provider  Requested Prescriptions  Pending Prescriptions Disp Refills   amLODipine (NORVASC) 5 MG tablet [Pharmacy Med Name: amLODIPine Besylate 5 MG Oral Tablet] 90 tablet 0    Sig: TAKE 1 TABLET BY MOUTH ONCE DAILY     Cardiovascular:  Calcium Channel Blockers Failed - 06/30/2018  3:49 PM      Failed - Last BP in normal range    BP Readings from Last 1 Encounters:  04/02/18 (!) 139/94         Passed - Valid encounter within last 6 months    Recent Outpatient Visits          2 months ago Needs flu shot   [facility] Gabriel Cirri, NP   7 months ago Essential hypertension   [facility] Gabriel Cirri, NP   8 months ago Primary osteoarthritis of left knee   [facility] Gabriel Cirri, NP   8 months ago Fatigue, unspecified type   [facility] Gabriel Cirri, NP   9 months ago Allergic rhinitis, unspecified seasonality, unspecified trigger   [facility] Particia Nearing, PA-C      Future Appointments            In 3 months Cannady, Dorie Rank, NP [facility]            Requested Prescriptions  Pending Prescriptions Disp Refills   amLODipine (NORVASC) 5 MG tablet [Pharmacy Med Name: amLODIPine Besylate 5 MG Oral Tablet] 90 tablet 0    Sig: TAKE 1 TABLET BY MOUTH ONCE DAILY     Cardiovascular:  Calcium Channel Blockers Failed - 06/30/2018  3:49 PM      Failed - Last BP in normal range    BP Readings from Last 1 Encounters:  04/02/18 (!) 139/94         Passed - Valid encounter within last 6 months    Recent Outpatient Visits          2 months ago Needs flu shot   [facility]  Gabriel Cirri, NP   7 months ago Essential hypertension   [facility] Gabriel Cirri, NP   8 months ago Primary osteoarthritis of left knee   [facility] Gabriel Cirri, NP   8 months ago Fatigue, unspecified type   [facility] Gabriel Cirri, NP   9 months ago Allergic rhinitis, unspecified seasonality, unspecified trigger   [facility] Particia Nearing, PA-C      Future Appointments            In 3 months Cannady, Dorie Rank, NP [facility]

## 2018-07-02 ENCOUNTER — Other Ambulatory Visit: Payer: Self-pay | Admitting: Unknown Physician Specialty

## 2018-07-02 MED ORDER — AMLODIPINE BESYLATE 5 MG PO TABS: 5.0000 mg | ORAL_TABLET | Freq: Every day | ORAL | 0 refills | Status: DC

## 2018-07-02 NOTE — Telephone Encounter (Signed)
Copied from CRM [id number]. Topic: Quick Communication - See Telephone Encounter >> Jul 02, 2018 10:55 AM Jens Som A wrote: CRM for notification. See Telephone encounter for: 07/02/18.  Patient is calling regarding the amLODipine (NORVASC) 5 MG tablet [[id number]] The Patient said that the [facility] does not have the script that was sent 06/30/18. Can the script be resent please? [facility] [address] [place] - [address] [phone] (Phone) [phone] (Fax)

## 2018-07-12 ENCOUNTER — Other Ambulatory Visit: Payer: Self-pay | Admitting: Family Medicine

## 2018-07-23 DIAGNOSIS — R946 Abnormal results of thyroid function studies: Secondary | ICD-10-CM | POA: Diagnosis not present

## 2018-07-23 DIAGNOSIS — R7989 Other specified abnormal findings of blood chemistry: Secondary | ICD-10-CM | POA: Diagnosis not present

## 2018-08-02 DIAGNOSIS — R7989 Other specified abnormal findings of blood chemistry: Secondary | ICD-10-CM | POA: Diagnosis not present

## 2018-08-10 ENCOUNTER — Other Ambulatory Visit: Payer: Self-pay | Admitting: Internal Medicine

## 2018-08-10 DIAGNOSIS — E0500 Thyrotoxicosis with diffuse goiter without thyrotoxic crisis or storm: Secondary | ICD-10-CM

## 2018-09-28 ENCOUNTER — Other Ambulatory Visit: Payer: Medicare HMO

## 2018-09-28 ENCOUNTER — Other Ambulatory Visit: Payer: Self-pay | Admitting: Unknown Physician Specialty

## 2018-09-28 MED ORDER — AMLODIPINE BESYLATE 5 MG PO TABS: 5.0000 mg | ORAL_TABLET | Freq: Every day | ORAL | 0 refills | Status: DC

## 2018-09-29 ENCOUNTER — Other Ambulatory Visit: Payer: Self-pay | Admitting: Nurse Practitioner

## 2018-09-29 DIAGNOSIS — I10 Essential (primary) hypertension: Secondary | ICD-10-CM

## 2018-09-29 DIAGNOSIS — E559 Vitamin D deficiency, unspecified: Secondary | ICD-10-CM

## 2018-09-29 DIAGNOSIS — Z13220 Encounter for screening for lipoid disorders: Secondary | ICD-10-CM

## 2018-09-29 NOTE — Progress Notes (Signed)
Lab orders only 

## 2018-09-30 ENCOUNTER — Encounter
Admission: RE | Admit: 2018-09-30 | Discharge: 2018-09-30 | Disposition: A | Discharge status: Home / Self Care | Payer: Medicare HMO | Source: Ambulatory Visit | Attending: Internal Medicine | Admitting: Internal Medicine

## 2018-09-30 ENCOUNTER — Other Ambulatory Visit: Payer: Self-pay

## 2018-09-30 DIAGNOSIS — E0500 Thyrotoxicosis with diffuse goiter without thyrotoxic crisis or storm: Secondary | ICD-10-CM | POA: Insufficient documentation

## 2018-09-30 MED ORDER — SODIUM IODIDE I-123 7.4 MBQ CAPS
450.6000 | ORAL_CAPSULE | Freq: Once | ORAL | Status: AC
  Administered 2018-09-30: 450.6 via ORAL

## 2018-10-01 ENCOUNTER — Encounter
Admission: RE | Admit: 2018-10-01 | Discharge: 2018-10-01 | Disposition: A | Discharge status: Home / Self Care | Payer: Medicare HMO | Source: Ambulatory Visit | Attending: Internal Medicine | Admitting: Internal Medicine

## 2018-10-01 DIAGNOSIS — E0500 Thyrotoxicosis with diffuse goiter without thyrotoxic crisis or storm: Secondary | ICD-10-CM | POA: Insufficient documentation

## 2018-10-01 DIAGNOSIS — R946 Abnormal results of thyroid function studies: Secondary | ICD-10-CM | POA: Diagnosis not present

## 2018-10-04 ENCOUNTER — Telehealth: Payer: Self-pay | Admitting: Unknown Physician Specialty

## 2018-10-04 NOTE — Telephone Encounter (Signed)
Called pt to set up virtual visit for tomorrow no answer, left voicemail. °

## 2018-10-05 ENCOUNTER — Ambulatory Visit: Payer: Medicare HMO | Admitting: Nurse Practitioner

## 2018-10-05 ENCOUNTER — Other Ambulatory Visit: Payer: Self-pay

## 2018-10-08 ENCOUNTER — Encounter
Admission: RE | Admit: 2018-10-08 | Discharge: 2018-10-08 | Disposition: A | Discharge status: Home / Self Care | Payer: Medicare HMO | Source: Ambulatory Visit | Attending: Internal Medicine | Admitting: Internal Medicine

## 2018-10-08 ENCOUNTER — Other Ambulatory Visit: Payer: Self-pay

## 2018-10-08 DIAGNOSIS — E0500 Thyrotoxicosis with diffuse goiter without thyrotoxic crisis or storm: Secondary | ICD-10-CM | POA: Diagnosis not present

## 2018-10-08 DIAGNOSIS — E0590 Thyrotoxicosis, unspecified without thyrotoxic crisis or storm: Secondary | ICD-10-CM | POA: Diagnosis not present

## 2018-10-08 MED ORDER — SODIUM IODIDE I 131 CAPSULE
30.2600 | Freq: Once | INTRAVENOUS | Status: AC | PRN
  Administered 2018-10-08: 30.26 via ORAL

## 2018-11-02 ENCOUNTER — Telehealth: Payer: Self-pay | Admitting: Unknown Physician Specialty

## 2018-11-02 NOTE — Telephone Encounter (Signed)
Copied from CRM [id number]. Topic: Quick Communication - Rx Refill/Question >> Nov 02, 2018 11:34 AM Floria Raveling A wrote: Medication: meloxicam (MOBIC) 15 MG tablet [[id number]]   Has the patient contacted their pharmacy? No (Agent: If no, request that the patient contact the pharmacy for the refill.) (Agent: If yes, when and what did the pharmacy advise?)  Preferred Pharmacy (with phone number or street name): pt is switching to [facility] order pharmacy   Agent: Please be advised that RX refills may take up to 3 business days. We ask that you follow-up with your pharmacy.

## 2018-11-05 ENCOUNTER — Other Ambulatory Visit: Payer: Self-pay

## 2018-11-05 MED ORDER — MELOXICAM 15 MG PO TABS: 15.0000 mg | ORAL_TABLET | Freq: Every day | ORAL | 0 refills | Status: DC

## 2018-11-05 MED ORDER — MONTELUKAST SODIUM 10 MG PO TABS: 10.0000 mg | ORAL_TABLET | Freq: Every day | ORAL | 3 refills | Status: DC

## 2018-11-05 MED ORDER — AMLODIPINE BESYLATE 5 MG PO TABS: 5.0000 mg | ORAL_TABLET | Freq: Every day | ORAL | 0 refills | Status: DC

## 2018-11-05 NOTE — Telephone Encounter (Signed)
[facility] pharmacy faxed a New Rx request on Amlodipine 5 mg tab, Meloxicam 15 mg tab, Montelukas 10 mg tab

## 2018-11-05 NOTE — Telephone Encounter (Signed)
Not Our patient

## 2018-11-17 DIAGNOSIS — E0500 Thyrotoxicosis with diffuse goiter without thyrotoxic crisis or storm: Secondary | ICD-10-CM | POA: Diagnosis not present

## 2019-01-05 ENCOUNTER — Encounter: Payer: Self-pay | Admitting: Nurse Practitioner

## 2019-01-05 ENCOUNTER — Ambulatory Visit (INDEPENDENT_AMBULATORY_CARE_PROVIDER_SITE_OTHER): Payer: Medicare HMO | Admitting: Nurse Practitioner

## 2019-01-05 ENCOUNTER — Other Ambulatory Visit: Payer: Self-pay

## 2019-01-05 VITALS — BP 135/86 | HR 97 | Temp 98.6°F | Wt 254.0 lb

## 2019-01-05 DIAGNOSIS — I10 Essential (primary) hypertension: Secondary | ICD-10-CM

## 2019-01-05 DIAGNOSIS — R7989 Other specified abnormal findings of blood chemistry: Secondary | ICD-10-CM | POA: Diagnosis not present

## 2019-01-05 DIAGNOSIS — Z13220 Encounter for screening for lipoid disorders: Secondary | ICD-10-CM

## 2019-01-05 MED ORDER — ALBUTEROL SULFATE HFA 108 (90 BASE) MCG/ACT IN AERS: 2.0000 | INHALATION_SPRAY | Freq: Four times a day (QID) | RESPIRATORY_TRACT | 4 refills | Status: DC | PRN

## 2019-01-05 MED ORDER — AMLODIPINE BESYLATE 5 MG PO TABS: 5.0000 mg | ORAL_TABLET | Freq: Every day | ORAL | 0 refills | Status: DC

## 2019-01-05 MED ORDER — MUPIROCIN 2 % EX OINT: 1.0000 "application " | TOPICAL_OINTMENT | Freq: Two times a day (BID) | CUTANEOUS | 0 refills | Status: DC

## 2019-01-05 NOTE — Assessment & Plan Note (Signed)
Followed by Dr. Solum. 

## 2019-01-05 NOTE — Assessment & Plan Note (Signed)
Chronic, ongoing at goal today.  Continue current medication regimen and adjust as needed.  Labs today, as has not had in one year.  Recommend checking BP at home three mornings a week once obtains BP cuff and documenting.

## 2019-01-05 NOTE — Patient Instructions (Addendum)
Please call [place] @ [phone] to schedule your mammogram.   DASH Eating Plan DASH stands for "Dietary Approaches to Stop Hypertension." The DASH eating plan is a healthy eating plan that has been shown to reduce high blood pressure (hypertension). It may also reduce your risk for type 2 diabetes, heart disease, and stroke. The DASH eating plan may also help with weight loss. What are tips for following this plan?  General guidelines  Avoid eating more than 2,300 mg (milligrams) of salt (sodium) a day. If you have hypertension, you may need to reduce your sodium intake to 1,500 mg a day.  Limit alcohol intake to no more than 1 drink a day for nonpregnant women and 2 drinks a day for men. One drink equals 12 oz of beer, 5 oz of wine, or 1 oz of hard liquor.  Work with your health care provider to maintain a healthy body weight or to lose weight. Ask what an ideal weight is for you.  Get at least 30 minutes of exercise that causes your heart to beat faster (aerobic exercise) most days of the week. Activities may include walking, swimming, or biking.  Work with your health care provider or diet and nutrition specialist (dietitian) to adjust your eating plan to your individual calorie needs. Reading food labels   Check food labels for the amount of sodium per serving. Choose foods with less than 5 percent of the Daily Value of sodium. Generally, foods with less than 300 mg of sodium per serving fit into this eating plan.  To find whole grains, look for the word "whole" as the first word in the ingredient list. Shopping  Buy products labeled as "low-sodium" or "no salt added."  Buy fresh foods. Avoid canned foods and premade or frozen meals. Cooking  Avoid adding salt when cooking. Use salt-free seasonings or herbs instead of table salt or sea salt. Check with your health care provider or pharmacist before using salt substitutes.  Do not fry foods. Cook foods using healthy methods  such as baking, boiling, grilling, and broiling instead.  Cook with heart-healthy oils, such as olive, canola, soybean, or sunflower oil. Meal planning  Eat a balanced diet that includes: ? 5 or more servings of fruits and vegetables each day. At each meal, try to fill half of your plate with fruits and vegetables. ? Up to 6-8 servings of whole grains each day. ? Less than 6 oz of lean meat, poultry, or fish each day. A 3-oz serving of meat is about the same size as a deck of cards. One egg equals 1 oz. ? 2 servings of low-fat dairy each day. ? A serving of nuts, seeds, or beans 5 times each week. ? Heart-healthy fats. Healthy fats called Omega-3 fatty acids are found in foods such as flaxseeds and coldwater fish, like sardines, salmon, and mackerel.  Limit how much you eat of the following: ? Canned or prepackaged foods. ? Food that is high in trans fat, such as fried foods. ? Food that is high in saturated fat, such as fatty meat. ? Sweets, desserts, sugary drinks, and other foods with added sugar. ? Full-fat dairy products.  Do not salt foods before eating.  Try to eat at least 2 vegetarian meals each week.  Eat more home-cooked food and less restaurant, buffet, and fast food.  When eating at a restaurant, ask that your food be prepared with less salt or no salt, if possible. What foods are recommended? The items  listed may not be a complete list. Talk with your dietitian about what dietary choices are best for you. Grains Whole-grain or whole-wheat bread. Whole-grain or whole-wheat pasta. Brown rice. [place]. Bulgur. Whole-grain and low-sodium cereals. Pita bread. Low-fat, low-sodium crackers. Whole-wheat flour tortillas. Vegetables Fresh or frozen vegetables (raw, steamed, roasted, or grilled). Low-sodium or reduced-sodium tomato and vegetable juice. Low-sodium or reduced-sodium tomato sauce and tomato paste. Low-sodium or reduced-sodium canned vegetables. Fruits All  fresh, dried, or frozen fruit. Canned fruit in natural juice (without added sugar). Meat and other protein foods Skinless chicken or [place]. Ground chicken or [place]. Pork with fat trimmed off. Fish and seafood. Egg whites. Dried beans, peas, or lentils. Unsalted nuts, nut butters, and seeds. Unsalted canned beans. Lean cuts of beef with fat trimmed off. Low-sodium, lean deli meat. Dairy Low-fat (1%) or fat-free (skim) milk. Fat-free, low-fat, or reduced-fat cheeses. Nonfat, low-sodium ricotta or cottage cheese. Low-fat or nonfat yogurt. Low-fat, low-sodium cheese. Fats and oils Soft margarine without trans fats. Vegetable oil. Low-fat, reduced-fat, or light mayonnaise and salad dressings (reduced-sodium). Canola, safflower, olive, soybean, and sunflower oils. Avocado. Seasoning and other foods Herbs. Spices. Seasoning mixes without salt. Unsalted popcorn and pretzels. Fat-free sweets. What foods are not recommended? The items listed may not be a complete list. Talk with your dietitian about what dietary choices are best for you. Grains Baked goods made with fat, such as croissants, muffins, or some breads. Dry pasta or rice meal packs. Vegetables Creamed or fried vegetables. Vegetables in a cheese sauce. Regular canned vegetables (not low-sodium or reduced-sodium). Regular canned tomato sauce and paste (not low-sodium or reduced-sodium). Regular tomato and vegetable juice (not low-sodium or reduced-sodium). Angie Fava. Olives. Fruits Canned fruit in a light or heavy syrup. Fried fruit. Fruit in cream or butter sauce. Meat and other protein foods Fatty cuts of meat. Ribs. Fried meat. Berniece Salines. Sausage. Bologna and other processed lunch meats. Salami. Fatback. Hotdogs. Bratwurst. Salted nuts and seeds. Canned beans with added salt. Canned or smoked fish. Whole eggs or egg yolks. Chicken or [place] with skin. Dairy Whole or 2% milk, cream, and half-and-half. Whole or full-fat cream cheese. Whole-fat or  sweetened yogurt. Full-fat cheese. Nondairy creamers. Whipped toppings. Processed cheese and cheese spreads. Fats and oils Butter. Stick margarine. Lard. Shortening. Ghee. Bacon fat. Tropical oils, such as coconut, palm kernel, or palm oil. Seasoning and other foods Salted popcorn and pretzels. Onion salt, garlic salt, seasoned salt, table salt, and sea salt. Worcestershire sauce. Tartar sauce. Barbecue sauce. Teriyaki sauce. Soy sauce, including reduced-sodium. Steak sauce. Canned and packaged gravies. Fish sauce. Oyster sauce. Cocktail sauce. Horseradish that you find on the shelf. Ketchup. Mustard. Meat flavorings and tenderizers. Bouillon cubes. Hot sauce and Tabasco sauce. Premade or packaged marinades. Premade or packaged taco seasonings. Relishes. Regular salad dressings. Where to find more information:  National Heart, Lung, and [place]: https://wilson-eaton.com/  American Heart Association: www.heart.org Summary  The DASH eating plan is a healthy eating plan that has been shown to reduce high blood pressure (hypertension). It may also reduce your risk for type 2 diabetes, heart disease, and stroke.  With the DASH eating plan, you should limit salt (sodium) intake to 2,300 mg a day. If you have hypertension, you may need to reduce your sodium intake to 1,500 mg a day.  When on the DASH eating plan, aim to eat more fresh fruits and vegetables, whole grains, lean proteins, low-fat dairy, and heart-healthy fats.  Work with your health care provider or  diet and nutrition specialist (dietitian) to adjust your eating plan to your individual calorie needs. This information is not intended to replace advice given to you by your health care provider. Make sure you discuss any questions you have with your health care provider. Document Released: 05/22/2011 Document Revised: 05/15/2017 Document Reviewed: 05/26/2016 Elsevier Patient Education  2020 [employer].

## 2019-01-05 NOTE — Progress Notes (Signed)
BP 135/86   Pulse 97   Temp 98.6 F (37 C) (Oral)   Wt 254 lb (115.2 kg)   SpO2 98%   BMI 43.90 kg/m    Subjective:    Patient ID: Jessica Brooks, female    DOB: 10/31/1950, 68 y.o.   MRN: [id number]  HPI: Jessica Brooks is a 68 y.o. female  Chief Complaint  Patient presents with  . Follow-up  . Medication Refill   HYPERTENSION Hypertension status: stable  Satisfied with current treatment? yes Duration of hypertension: chronic BP monitoring frequency:  not checking BP range: [employer] is sending her a cuff BP medication side effects:  no Medication compliance: good compliance Aspirin: no Recurrent headaches: no Visual changes: no Palpitations: no Dyspnea: no Chest pain: no Lower extremity edema: no Dizzy/lightheaded: no   LOW TSH LEVEL: Last level 0.360 and T4 6.8.  Sees Dr. Tedd SiasSolum, last 11/17/2018.  No current treatments, goes back September.    Relevant past medical, surgical, family and social history reviewed and updated as indicated. Interim medical history since our last visit reviewed. Allergies and medications reviewed and updated.  Review of Systems  Constitutional: Negative for activity change, appetite change, diaphoresis, fatigue and fever.  Respiratory: Negative for cough, chest tightness and shortness of breath.   Cardiovascular: Negative for chest pain, palpitations and leg swelling.  Gastrointestinal: Negative for abdominal distention, abdominal pain, constipation, diarrhea, nausea and vomiting.  Neurological: Negative for dizziness, syncope, weakness, light-headedness, numbness and headaches.  Psychiatric/Behavioral: Negative.     Per HPI unless specifically indicated above     Objective:    BP 135/86   Pulse 97   Temp 98.6 F (37 C) (Oral)   Wt 254 lb (115.2 kg)   SpO2 98%   BMI 43.90 kg/m   Wt Readings from Last 3 Encounters:  01/05/19 254 lb (115.2 kg)  04/02/18 242 lb 12.8 oz (110.1 kg)  11/23/17 244 lb 12.8 oz (111  kg)    Physical Exam Vitals signs and nursing note reviewed.  Constitutional:      General: She is awake. She is not in acute distress.    Appearance: She is well-developed. She is not ill-appearing.  HENT:     Head: Normocephalic.     Right Ear: Hearing normal.     Left Ear: Hearing normal.     Nose: Nose normal.     Mouth/Throat:     Mouth: Mucous membranes are moist.  Eyes:     General: Lids are normal.        Right eye: No discharge.        Left eye: No discharge.     Conjunctiva/sclera: Conjunctivae normal.     Pupils: Pupils are equal, round, and reactive to light.  Neck:     Musculoskeletal: Normal range of motion and neck supple.     Thyroid: No thyromegaly.     Vascular: No carotid bruit or JVD.  Cardiovascular:     Rate and Rhythm: Normal rate and regular rhythm.     Heart sounds: Normal heart sounds. No murmur. No gallop.   Pulmonary:     Effort: Pulmonary effort is normal. No accessory muscle usage or respiratory distress.     Breath sounds: Normal breath sounds.  Abdominal:     General: Bowel sounds are normal.     Palpations: Abdomen is soft. There is no hepatomegaly or splenomegaly.  Musculoskeletal:     Right lower leg: No edema.  Left lower leg: No edema.  Lymphadenopathy:     Cervical: No cervical adenopathy.  Skin:    General: Skin is warm and dry.  Neurological:     Mental Status: She is alert and oriented to person, place, and time.  Psychiatric:        Attention and Perception: Attention normal.        Mood and Affect: Mood normal.        Behavior: Behavior normal. Behavior is cooperative.        Thought Content: Thought content normal.        Judgment: Judgment normal.     Results for orders placed or performed in visit on 04/02/18  Comprehensive metabolic panel  Result Value Ref Range   Glucose 81 65 - 99 mg/dL   BUN 13 8 - 27 mg/dL   Creatinine, Ser 0.57 0.57 - 1.00 mg/dL   GFR calc non Af Amer 96 >59 mL/min/1.73   GFR calc Af Amer  110 >59 mL/min/1.73   BUN/Creatinine Ratio 23 12 - 28   Sodium 140 134 - 144 mmol/L   Potassium 4.7 3.5 - 5.2 mmol/L   Chloride 102 96 - 106 mmol/L   CO2 24 20 - 29 mmol/L   Calcium 9.2 8.7 - 10.3 mg/dL   Total Protein 6.9 6.0 - 8.5 g/dL   Albumin 4.0 3.6 - 4.8 g/dL   Globulin, Total 2.9 1.5 - 4.5 g/dL   Albumin/Globulin Ratio 1.4 1.2 - 2.2   Bilirubin Total 0.3 0.0 - 1.2 mg/dL   Alkaline Phosphatase 70 39 - 117 IU/L   AST 13 0 - 40 IU/L   ALT 16 0 - 32 IU/L  Lipid Panel w/o Chol/HDL Ratio  Result Value Ref Range   Cholesterol, Total 163 100 - 199 mg/dL   Triglycerides 93 0 - 149 mg/dL   HDL 55 >39 mg/dL   VLDL Cholesterol Cal 19 5 - 40 mg/dL   LDL Calculated 89 0 - 99 mg/dL  Thyroid Panel With TSH  Result Value Ref Range   TSH 0.360 (L) 0.450 - 4.500 uIU/mL   T4, Total 6.8 4.5 - 12.0 ug/dL   T3 Uptake Ratio 31 24 - 39 %   Free Thyroxine Index 2.1 1.2 - 4.9      Assessment & Plan:   Problem List Items Addressed This Visit      Cardiovascular and Mediastinum   Hypertension - Primary    Chronic, ongoing at goal today.  Continue current medication regimen and adjust as needed.  Labs today, as has not had in one year.  Recommend checking BP at home three mornings a week once obtains BP cuff and documenting.      Relevant Medications   amLODipine (NORVASC) 5 MG tablet   Other Relevant Orders   CBC with Differential/Platelet   Comprehensive metabolic panel     Other   Low TSH level    Followed by Dr. Gabriel Carina       Other Visit Diagnoses    Screening cholesterol level       Relevant Orders   Comprehensive metabolic panel   Lipid Panel w/o Chol/HDL Ratio       Follow up plan: Return in about 6 months (around 07/08/2019) for Annual physical.

## 2019-01-06 LAB — CBC WITH DIFFERENTIAL/PLATELET
Basophils Absolute: 0 10*3/uL (ref 0.0–0.2)
Basos: 1 %
EOS (ABSOLUTE): 0 10*3/uL (ref 0.0–0.4)
Eos: 0 %
Hematocrit: 41.6 % (ref 34.0–46.6)
Hemoglobin: 13.4 g/dL (ref 11.1–15.9)
Immature Grans (Abs): 0 10*3/uL (ref 0.0–0.1)
Immature Granulocytes: 0 %
Lymphocytes Absolute: 1.7 10*3/uL (ref 0.7–3.1)
Lymphs: 38 %
MCH: 26.8 pg (ref 26.6–33.0)
MCHC: 32.2 g/dL (ref 31.5–35.7)
MCV: 83 fL (ref 79–97)
Monocytes Absolute: 0.4 10*3/uL (ref 0.1–0.9)
Monocytes: 8 %
Neutrophils Absolute: 2.4 10*3/uL (ref 1.4–7.0)
Neutrophils: 53 %
Platelets: 298 10*3/uL (ref 150–450)
RBC: 5 x10E6/uL (ref 3.77–5.28)
RDW: 15 % (ref 11.7–15.4)
WBC: 4.5 10*3/uL (ref 3.4–10.8)

## 2019-01-06 LAB — COMPREHENSIVE METABOLIC PANEL
ALT: 21 IU/L (ref 0–32)
AST: 13 IU/L (ref 0–40)
Albumin/Globulin Ratio: 1.3 (ref 1.2–2.2)
Albumin: 4 g/dL (ref 3.8–4.8)
Alkaline Phosphatase: 67 IU/L (ref 39–117)
BUN/Creatinine Ratio: 16 (ref 12–28)
BUN: 11 mg/dL (ref 8–27)
Bilirubin Total: 0.3 mg/dL (ref 0.0–1.2)
CO2: 25 mmol/L (ref 20–29)
Calcium: 9.1 mg/dL (ref 8.7–10.3)
Chloride: 99 mmol/L (ref 96–106)
Creatinine, Ser: 0.67 mg/dL (ref 0.57–1.00)
GFR calc Af Amer: 104 mL/min/{1.73_m2} (ref >59)
GFR calc non Af Amer: 90 mL/min/{1.73_m2} (ref >59)
Globulin, Total: 3.2 g/dL (ref 1.5–4.5)
Glucose: 87 mg/dL (ref 65–99)
Potassium: 4.2 mmol/L (ref 3.5–5.2)
Sodium: 138 mmol/L (ref 134–144)
Total Protein: 7.2 g/dL (ref 6.0–8.5)

## 2019-01-06 LAB — LIPID PANEL W/O CHOL/HDL RATIO
Cholesterol, Total: 167 mg/dL (ref 100–199)
HDL: 62 mg/dL (ref >39)
LDL Calculated: 91 mg/dL (ref 0–99)
Triglycerides: 69 mg/dL (ref 0–149)
VLDL Cholesterol Cal: 14 mg/dL (ref 5–40)

## 2019-01-06 NOTE — Progress Notes (Signed)
Normal test results noted.  Please call patient and make them aware of normal results and will continue to monitor at regular visits.  Have a great day.  Look forward to seeing you at your next visit.

## 2019-01-17 ENCOUNTER — Other Ambulatory Visit: Payer: Self-pay | Admitting: Nurse Practitioner

## 2019-01-17 ENCOUNTER — Telehealth: Payer: Self-pay | Admitting: Nurse Practitioner

## 2019-01-17 MED ORDER — MELOXICAM 15 MG PO TABS: 15.0000 mg | ORAL_TABLET | Freq: Every day | ORAL | 0 refills | Status: DC

## 2019-01-17 NOTE — Telephone Encounter (Signed)
Script sent  

## 2019-01-17 NOTE — Telephone Encounter (Signed)
Refill for meloxicam (MOBIC) 15 MG tablet   Pharmacy:  [facility] [place], [place] [phone] (Phone) [phone] (Fax)

## 2019-01-17 NOTE — Telephone Encounter (Signed)
Routing to provider  

## 2019-02-10 DIAGNOSIS — Z20828 Contact with and (suspected) exposure to other viral communicable diseases: Secondary | ICD-10-CM | POA: Diagnosis not present

## 2019-02-11 DIAGNOSIS — Z20828 Contact with and (suspected) exposure to other viral communicable diseases: Secondary | ICD-10-CM | POA: Diagnosis not present

## 2019-02-21 ENCOUNTER — Other Ambulatory Visit: Payer: Self-pay | Admitting: Nurse Practitioner

## 2019-02-22 ENCOUNTER — Other Ambulatory Visit: Payer: Self-pay | Admitting: Nurse Practitioner

## 2019-02-22 DIAGNOSIS — Z1231 Encounter for screening mammogram for malignant neoplasm of breast: Secondary | ICD-10-CM

## 2019-03-01 DIAGNOSIS — E0500 Thyrotoxicosis with diffuse goiter without thyrotoxic crisis or storm: Secondary | ICD-10-CM | POA: Diagnosis not present

## 2019-03-07 ENCOUNTER — Other Ambulatory Visit: Payer: Self-pay | Admitting: Nurse Practitioner

## 2019-03-07 MED ORDER — AMLODIPINE BESYLATE 5 MG PO TABS: 5.0000 mg | ORAL_TABLET | Freq: Every day | ORAL | 0 refills | Status: DC

## 2019-03-07 NOTE — Telephone Encounter (Signed)
Copied from [place] [phone]. Topic: Quick Communication - Rx Refill/Question >> Mar 07, 2019 10:51 AM Yvette Rack wrote: Medication: amLODipine (NORVASC) 5 MG tablet  Has the patient contacted their pharmacy? yes   Preferred Pharmacy (with phone number or street name): [place], [place] [phone] (Phone)   [phone] (Fax)  Agent: Please be advised that RX refills may take up to 3 business days. We ask that you follow-up with your pharmacy.

## 2019-03-14 ENCOUNTER — Ambulatory Visit (INDEPENDENT_AMBULATORY_CARE_PROVIDER_SITE_OTHER): Payer: Medicare HMO

## 2019-03-14 ENCOUNTER — Other Ambulatory Visit: Payer: Self-pay

## 2019-03-14 DIAGNOSIS — Z23 Encounter for immunization: Secondary | ICD-10-CM

## 2019-03-29 ENCOUNTER — Other Ambulatory Visit: Payer: Self-pay | Admitting: *Deleted

## 2019-03-29 ENCOUNTER — Ambulatory Visit
Admission: RE | Admit: 2019-03-29 | Discharge: 2019-03-29 | Disposition: A | Discharge status: Home / Self Care | Payer: Medicare HMO | Source: Ambulatory Visit | Attending: Nurse Practitioner | Admitting: Nurse Practitioner

## 2019-03-29 DIAGNOSIS — Z1231 Encounter for screening mammogram for malignant neoplasm of breast: Secondary | ICD-10-CM

## 2019-03-29 DIAGNOSIS — Z20828 Contact with and (suspected) exposure to other viral communicable diseases: Secondary | ICD-10-CM | POA: Diagnosis not present

## 2019-03-29 DIAGNOSIS — Z20822 Contact with and (suspected) exposure to covid-19: Secondary | ICD-10-CM

## 2019-03-31 LAB — NOVEL CORONAVIRUS, NAA: SARS-CoV-2, NAA: NOT DETECTED

## 2019-04-06 ENCOUNTER — Ambulatory Visit (INDEPENDENT_AMBULATORY_CARE_PROVIDER_SITE_OTHER): Payer: Medicare HMO

## 2019-04-06 VITALS — Ht 65.0 in | Wt 254.6 lb

## 2019-04-06 DIAGNOSIS — Z0000 Encounter for general adult medical examination without abnormal findings: Secondary | ICD-10-CM

## 2019-04-06 NOTE — Patient Instructions (Addendum)
Jessica Brooks , Thank you for taking time to come for your Medicare Wellness Visit. I appreciate your ongoing commitment to your health goals. Please review the following plan we discussed and let me know if I can assist you in the future.   Screening recommendations/referrals: Colonoscopy: completed 04/23/2009 Mammogram: completed 03/29/2019 Bone Density: completed 04/14/2016 Recommended yearly ophthalmology/optometry visit for glaucoma screening and checkup Recommended yearly dental visit for hygiene and checkup  Vaccinations: Influenza vaccine: up to date Pneumococcal vaccine: up to date Tdap vaccine: up to date Shingles vaccine: shingrix eligible     Advanced directives: Please bring a copy of your health care power of attorney and living will to the office at your convenience.  Conditions/risks identified: stay safe!  Next appointment: Follow up in one year for your annual wellness visit    Preventive Care 65 Years and Older, Female Preventive care refers to lifestyle choices and visits with your health care provider that can promote health and wellness. What does preventive care include?  A yearly physical exam. This is also called an annual well check.  Dental exams once or twice a year.  Routine eye exams. Ask your health care provider how often you should have your eyes checked.  Personal lifestyle choices, including:  Daily care of your teeth and gums.  Regular physical activity.  Eating a healthy diet.  Avoiding tobacco and drug use.  Limiting alcohol use.  Practicing safe sex.  Taking low-dose aspirin every day.  Taking vitamin and mineral supplements as recommended by your health care provider. What happens during an annual well check? The services and screenings done by your health care provider during your annual well check will depend on your age, overall health, lifestyle risk factors, and family history of disease. Counseling  Your health  care provider may ask you questions about your:  Alcohol use.  Tobacco use.  Drug use.  Emotional well-being.  Home and relationship well-being.  Sexual activity.  Eating habits.  History of falls.  Memory and ability to understand (cognition).  Work and work Statistician.  Reproductive health. Screening  You may have the following tests or measurements:  Height, weight, and BMI.  Blood pressure.  Lipid and cholesterol levels. These may be checked every 5 years, or more frequently if you are over 39 years old.  Skin check.  Lung cancer screening. You may have this screening every year starting at age 19 if you have a 30-pack-year history of smoking and currently smoke or have quit within the past 15 years.  Fecal occult blood test (FOBT) of the stool. You may have this test every year starting at age 70.  Flexible sigmoidoscopy or colonoscopy. You may have a sigmoidoscopy every 5 years or a colonoscopy every 10 years starting at age 78.  Hepatitis C blood test.  Hepatitis B blood test.  Sexually transmitted disease (STD) testing.  Diabetes screening. This is done by checking your blood sugar (glucose) after you have not eaten for a while (fasting). You may have this done every 1-3 years.  Bone density scan. This is done to screen for osteoporosis. You may have this done starting at age 58.  Mammogram. This may be done every 1-2 years. Talk to your health care provider about how often you should have regular mammograms. Talk with your health care provider about your test results, treatment options, and if necessary, the need for more tests. Vaccines  Your health care provider may recommend certain vaccines, such as:  Influenza vaccine. This is recommended every year.  Tetanus, diphtheria, and acellular pertussis (Tdap, Td) vaccine. You may need a Td booster every 10 years.  Zoster vaccine. You may need this after age 19.  Pneumococcal 13-valent conjugate  (PCV13) vaccine. One dose is recommended after age 33.  Pneumococcal polysaccharide (PPSV23) vaccine. One dose is recommended after age 84. Talk to your health care provider about which screenings and vaccines you need and how often you need them. This information is not intended to replace advice given to you by your health care provider. Make sure you discuss any questions you have with your health care provider. Document Released: 06/29/2015 Document Revised: 02/20/2016 Document Reviewed: 04/03/2015 Elsevier Interactive Patient Education  2017 [place] Prevention in the Home Falls can cause injuries. They can happen to people of all ages. There are many things you can do to make your home safe and to help prevent falls. What can I do on the outside of my home?  Regularly fix the edges of walkways and driveways and fix any cracks.  Remove anything that might make you trip as you walk through a door, such as a raised step or threshold.  Trim any bushes or trees on the path to your home.  Use bright outdoor lighting.  Clear any walking paths of anything that might make someone trip, such as rocks or tools.  Regularly check to see if handrails are loose or broken. Make sure that both sides of any steps have handrails.  Any raised decks and porches should have guardrails on the edges.  Have any leaves, snow, or ice cleared regularly.  Use sand or salt on walking paths during winter.  Clean up any spills in your garage right away. This includes oil or grease spills. What can I do in the bathroom?  Use night lights.  Install grab bars by the toilet and in the tub and shower. Do not use towel bars as grab bars.  Use non-skid mats or decals in the tub or shower.  If you need to sit down in the shower, use a plastic, non-slip stool.  Keep the floor dry. Clean up any water that spills on the floor as soon as it happens.  Remove soap buildup in the tub or shower  regularly.  Attach bath mats securely with double-sided non-slip rug tape.  Do not have throw rugs and other things on the floor that can make you trip. What can I do in the bedroom?  Use night lights.  Make sure that you have a light by your bed that is easy to reach.  Do not use any sheets or blankets that are too big for your bed. They should not hang down onto the floor.  Have a firm chair that has side arms. You can use this for support while you get dressed.  Do not have throw rugs and other things on the floor that can make you trip. What can I do in the kitchen?  Clean up any spills right away.  Avoid walking on wet floors.  Keep items that you use a lot in easy-to-reach places.  If you need to reach something above you, use a strong step stool that has a grab bar.  Keep electrical cords out of the way.  Do not use floor polish or wax that makes floors slippery. If you must use wax, use non-skid floor wax.  Do not have throw rugs and other things on the floor that  can make you trip. What can I do with my stairs?  Do not leave any items on the stairs.  Make sure that there are handrails on both sides of the stairs and use them. Fix handrails that are broken or loose. Make sure that handrails are as long as the stairways.  Check any carpeting to make sure that it is firmly attached to the stairs. Fix any carpet that is loose or worn.  Avoid having throw rugs at the top or bottom of the stairs. If you do have throw rugs, attach them to the floor with carpet tape.  Make sure that you have a light switch at the top of the stairs and the bottom of the stairs. If you do not have them, ask someone to add them for you. What else can I do to help prevent falls?  Wear shoes that:  Do not have high heels.  Have rubber bottoms.  Are comfortable and fit you well.  Are closed at the toe. Do not wear sandals.  If you use a stepladder:  Make sure that it is fully  opened. Do not climb a closed stepladder.  Make sure that both sides of the stepladder are locked into place.  Ask someone to hold it for you, if possible.  Clearly mark and make sure that you can see:  Any grab bars or handrails.  First and last steps.  Where the edge of each step is.  Use tools that help you move around (mobility aids) if they are needed. These include:  Canes.  Walkers.  Scooters.  Crutches.  Turn on the lights when you go into a dark area. Replace any light bulbs as soon as they burn out.  Set up your furniture so you have a clear path. Avoid moving your furniture around.  If any of your floors are uneven, fix them.  If there are any pets around you, be aware of where they are.  Review your medicines with your doctor. Some medicines can make you feel dizzy. This can increase your chance of falling. Ask your doctor what other things that you can do to help prevent falls. This information is not intended to replace advice given to you by your health care provider. Make sure you discuss any questions you have with your health care provider. Document Released: 03/29/2009 Document Revised: 11/08/2015 Document Reviewed: 07/07/2014 Elsevier Interactive Patient Education  2017 [employer].

## 2019-04-06 NOTE — Progress Notes (Signed)
Subjective:   Jessica Brooks is a 68 y.o. female who presents for Medicare Annual (Subsequent) preventive examination.  This visit is being conducted via phone call  - after an attmept to do on video chat - due to the COVID-19 pandemic. This patient has given me verbal consent via phone to conduct this visit, patient states they are participating from their home address. Some vital signs may be absent or patient reported.   Patient identification: identified by name, DOB, and current address.    Review of Systems:   Cardiac Risk Factors include: advanced age (>23men, >50 women);hypertension;obesity (BMI >30kg/m2)     Objective:     Vitals: Ht 5\' 5"  (1.651 m)   Wt 254 lb 9.6 oz (115.5 kg) Comment: pt reported  BMI 42.37 kg/m   Body mass index is 42.37 kg/m.  Advanced Directives 06/11/2017 04/08/2017 12/14/2015 11/20/2015 04/08/2015  Does Patient Have a Medical Advance Directive? Yes Yes No No Yes  Type of Advance Directive Healthcare Power of Attorney - - - Healthcare Power of Attorney  Does patient want to make changes to medical advance directive? No - Patient declined Yes (MAU/Ambulatory/Procedural Areas - Information given) - - No - Patient declined  Copy of Healthcare Power of Attorney in Chart? No - copy requested - - - No - copy requested  Would patient like information on creating a medical advance directive? - - - No - patient declined information -    Tobacco Social History   Tobacco Use  Smoking Status Former Smoker  . Quit date: 06/16/1978  . Years since quitting: 40.8  Smokeless Tobacco Never Used     Counseling given: Not Answered   Clinical Intake:  Pre-visit preparation completed: Yes  Pain : No/denies pain     Nutritional Risks: None Diabetes: No  How often do you need to have someone help you when you read instructions, pamphlets, or other written materials from your doctor or pharmacy?: 1 - Never  Interpreter Needed?: No  Information  entered by ::  ,LPN  Past Medical History:  Diagnosis Date  . Arthritis   . Asthma    uses inhaler  . Cataract   . Hypertension   . Keloid    Past Surgical History:  Procedure Laterality Date  . ABDOMINAL HYSTERECTOMY    . BREAST BIOPSY Right    needle bx years ago unsure of date  . BREAST BIOPSY Right    another bx done by [facility]   . CATARACT EXTRACTION W/PHACO Left 06/11/2017   Procedure: CATARACT EXTRACTION PHACO AND INTRAOCULAR LENS PLACEMENT (IOC)-LEFT;  Surgeon: 06/13/2017, MD;  Location: [facility] ORS;  Service: Ophthalmology;  Laterality: Left;  LOT # Lockie Mola [id number] :34.4 AP 15.3% CDE 5.29  . 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 MULTIPLE TOOTH EXTRACTIONS     Family History  Problem Relation Age of Onset  . Diabetes Mother   . Hypertension Mother   . Cancer Father        throat  . Diabetes Sister   . Kidney disease Sister   . Diabetes Brother   . Kidney disease Brother    Social History   Socioeconomic History  . Marital status: Divorced    Spouse name: Not on file  . Number of children: Not on file  . Years of education: Not on file  . Highest education level: Not on file  Occupational History  . Not on file  Social  Needs  . Financial resource strain: Not very hard  . Food insecurity    Worry: Never true    Inability: Never true  . Transportation needs    Medical: Yes    Non-medical: No  Tobacco Use  . Smoking status: Former Smoker    Quit date: 06/16/1978    Years since quitting: 40.8  . Smokeless tobacco: Never Used  Substance and Sexual Activity  . Alcohol use: No  . Drug use: No  . Sexual activity: Never  Lifestyle  . Physical activity    Days per week: 7 days    Minutes per session: 30 min  . Stress: Not at all  Relationships  . Social Musicianconnections    Talks on phone: More than three times a week    Gets together: Twice a week    Attends religious service: Never    Active member of club or  organization: No    Attends meetings of clubs or organizations: Never    Relationship status: Divorced  Other Topics Concern  . Not on file  Social History Narrative  . Not on file    Outpatient Encounter Medications as of 04/06/2019  Medication Sig  . albuterol (VENTOLIN HFA) 108 (90 Base) MCG/ACT inhaler Inhale 2 puffs into the lungs every 6 (six) hours as needed for wheezing or shortness of breath.  Marland Kitchen. amLODipine (NORVASC) 5 MG tablet Take 1 tablet (5 mg total) by mouth daily.  . Cholecalciferol (VITAMIN D PO) Take 1 capsule by mouth daily.   Marland Kitchen. levothyroxine (SYNTHROID) 50 MCG tablet Take by mouth.  . meloxicam (MOBIC) 15 MG tablet TAKE 1 TABLET EVERY DAY  . montelukast (SINGULAIR) 10 MG tablet Take 1 tablet (10 mg total) by mouth at bedtime.  . mupirocin ointment (BACTROBAN) 2 % Apply 1 application topically 2 (two) times daily.   No facility-administered encounter medications on file as of 04/06/2019.     Activities of Daily Living In your present state of health, do you have any difficulty performing the following activities: 04/06/2019  Hearing? N  Comment no hearing aids  Vision? N  Comment eyeglasses, goes to [facility]  Difficulty concentrating or making decisions? N  Walking or climbing stairs? N  Dressing or bathing? N  Doing errands, shopping? Y  Comment friend helps  Quarry managerreparing Food and eating ? N  Using the Toilet? N  In the past six months, have you accidently leaked urine? N  Do you have problems with loss of bowel control? N  Managing your Medications? N  Managing your Finances? N  Housekeeping or managing your Housekeeping? N  Some recent data might be hidden    Patient Care Team: Marjie Skiffannady, Jolene T, NP as PCP - General (Nurse Practitioner)    Assessment:   This is a routine wellness examination for GutierrezRosa.  Exercise Activities and Dietary recommendations Current Exercise Habits: Home exercise routine, Type of exercise: walking, Time (Minutes):  30, Frequency (Times/Week): 7, Weekly Exercise (Minutes/Week): 210, Intensity: Mild, Exercise limited by: None identified  Goals    . Increase physical activity     Increase walking to 3x a week for at least 20 minutes       Fall Risk: Fall Risk  04/06/2019 01/05/2019 04/02/2018 04/08/2017 12/14/2015  Falls in the past year? 0 0 No No No  Number falls in past yr: 0 0 - - -  Injury with Fall? 0 0 - - -  Risk for fall due to : - History  of fall(s) - - -  Follow up - Falls evaluation completed - - -    FALL RISK PREVENTION PERTAINING TO THE HOME:  Any stairs in or around the home? No  If so, are there any without handrails? No   Home free of loose throw rugs in walkways, pet beds, electrical cords, etc? Yes  Adequate lighting in your home to reduce risk of falls? Yes   ASSISTIVE DEVICES UTILIZED TO PREVENT FALLS:  Life alert? No  Use of a cane, walker or w/c? No  Grab bars in the bathroom? No  Shower chair or bench in shower? No  Elevated toilet seat or a handicapped toilet? No   DME ORDERS:  DME order needed?  No   TIMED UP AND GO:  Unable to perform    Depression Screen PHQ 2/9 Scores 04/06/2019 04/02/2018 04/02/2018 04/08/2017  PHQ - 2 Score 0 1 1 0  PHQ- 9 Score - Cognitive Function     6CIT Screen 04/08/2017  What Year? 0 points  What month? 0 points  What time? 0 points  Count back from 20 0 points  Months in reverse 0 points  Repeat phrase 0 points  Total Score 0    Immunization History  Administered Date(s) Administered  . Fluad Quad(high Dose 65+) 03/14/2019  . Influenza, High Dose Seasonal PF 04/08/2017, 04/02/2018  . Influenza-Unspecified 03/24/2016  . Pneumococcal Conjugate-13 12/14/2015  . Pneumococcal Polysaccharide-23 04/08/2017  . Tdap 11/20/2015  . Zoster 11/20/2015    Qualifies for Shingles Vaccine? Yes  Zostavax completed 2017. Due for Shingrix. Education has been provided regarding the importance of this vaccine. Pt has  been advised to call insurance company to determine out of pocket expense. Advised may also receive vaccine at local pharmacy or Health Dept. Verbalized acceptance and understanding.  Tdap: up to date   Flu Vaccine: up to date   Pneumococcal Vaccine: up to date   Screening Tests Health Maintenance  Topic Date Due  . COLONOSCOPY  04/24/2019  . MAMMOGRAM  03/28/2021  . TETANUS/TDAP  11/19/2025  . INFLUENZA VACCINE  Completed  . DEXA SCAN  Completed  . Hepatitis C Screening  Completed  . PNA vac Low Risk Adult  Completed    Cancer Screenings:  Colorectal Screening: Completed 04/23/2009 Repeat every 10 years; patient doing cologuard this year.    Mammogram: Completed 03/29/2019.   Bone Density: Completed 04/14/2016.   Lung Cancer Screening: (Low Dose CT Chest recommended if Age 43-80 years, 30 pack-year currently smoking OR have quit w/in 15years.) does not qualify.    Additional Screening:  Hepatitis C Screening: does qualify; Completed 12/14/2015  Vision Screening: Recommended annual ophthalmology exams for early detection of glaucoma and other disorders of the eye. Is the patient up to date with their annual eye exam?  Yes  Who is the provider or what is the name of the office in which the pt attends annual eye exams? [facility]   Dental Screening: Recommended annual dental exams for proper oral hygiene  Community Resource Referral:  CRR required this visit?  No       Plan:  I have personally reviewed and addressed the Medicare Annual Wellness questionnaire and have noted the following in the patient's chart:  A. Medical and social history B. Use of alcohol, tobacco or illicit drugs  C. Current medications and supplements D. Functional ability and status E.  Nutritional status F.  Physical activity G. Advance  directives H. List of other physicians I.  Hospitalizations, surgeries, and ER visits in previous 12 months J.  [place] such as  hearing and vision if needed, cognitive and depression L. Referrals and appointments   In addition, I have reviewed and discussed with patient certain preventive protocols, quality metrics, and best practice recommendations. A written personalized care plan for preventive services as well as general preventive health recommendations were provided to patient.  Signed,    Bevelyn Ngo, LPN  [id number] Nurse Health Advisor   Nurse Notes: none

## 2019-04-13 ENCOUNTER — Other Ambulatory Visit: Payer: Self-pay | Admitting: Nurse Practitioner

## 2019-04-27 ENCOUNTER — Other Ambulatory Visit: Payer: Self-pay

## 2019-04-29 ENCOUNTER — Other Ambulatory Visit: Payer: Self-pay

## 2019-04-29 ENCOUNTER — Encounter: Payer: Self-pay | Admitting: Nurse Practitioner

## 2019-04-29 ENCOUNTER — Ambulatory Visit (INDEPENDENT_AMBULATORY_CARE_PROVIDER_SITE_OTHER): Payer: Medicare HMO | Admitting: Nurse Practitioner

## 2019-04-29 VITALS — BP 140/82 | HR 82 | Temp 98.6°F | Ht 65.0 in | Wt 257.0 lb

## 2019-04-29 DIAGNOSIS — Z13220 Encounter for screening for lipoid disorders: Secondary | ICD-10-CM | POA: Diagnosis not present

## 2019-04-29 DIAGNOSIS — I10 Essential (primary) hypertension: Secondary | ICD-10-CM

## 2019-04-29 DIAGNOSIS — R7301 Impaired fasting glucose: Secondary | ICD-10-CM | POA: Diagnosis not present

## 2019-04-29 DIAGNOSIS — Z6841 Body Mass Index (BMI) 40.0 and over, adult: Secondary | ICD-10-CM | POA: Diagnosis not present

## 2019-04-29 DIAGNOSIS — R350 Frequency of micturition: Secondary | ICD-10-CM | POA: Insufficient documentation

## 2019-04-29 DIAGNOSIS — E559 Vitamin D deficiency, unspecified: Secondary | ICD-10-CM | POA: Diagnosis not present

## 2019-04-29 DIAGNOSIS — R300 Dysuria: Secondary | ICD-10-CM | POA: Diagnosis not present

## 2019-04-29 LAB — LIPID PANEL PICCOLO, WAIVED
Chol/HDL Ratio Piccolo,Waive: 2.5 mg/dL
Cholesterol Piccolo, Waived: 166 mg/dL (ref <200)
HDL Chol Piccolo, Waived: 68 mg/dL (ref >59)
LDL Chol Calc Piccolo Waived: 86 mg/dL (ref <100)
Triglycerides Piccolo,Waived: 63 mg/dL (ref <150)
VLDL Chol Calc Piccolo,Waive: 13 mg/dL (ref <30)

## 2019-04-29 LAB — UA/M W/RFLX CULTURE, ROUTINE
Bilirubin, UA: NEGATIVE
Glucose, UA: NEGATIVE
Ketones, UA: NEGATIVE
Leukocytes,UA: NEGATIVE
Nitrite, UA: NEGATIVE
Protein,UA: NEGATIVE
Specific Gravity, UA: 1.02 (ref 1.005–1.030)
Urobilinogen, Ur: 0.2 mg/dL (ref 0.2–1.0)
pH, UA: 7 (ref 5.0–7.5)

## 2019-04-29 LAB — WET PREP FOR TRICH, YEAST, CLUE
Clue Cell Exam: NEGATIVE
Trichomonas Exam: NEGATIVE
Yeast Exam: NEGATIVE

## 2019-04-29 LAB — MICROSCOPIC EXAMINATION
Bacteria, UA: NONE SEEN
WBC, UA: NONE SEEN /hpf (ref 0–5)

## 2019-04-29 MED ORDER — AMLODIPINE BESYLATE 10 MG PO TABS: 10.0000 mg | ORAL_TABLET | Freq: Every day | ORAL | 3 refills | Status: DC

## 2019-04-29 NOTE — Progress Notes (Signed)
BP 140/82 (BP Location: Left Arm, Patient Position: Sitting)   Pulse 82   Temp 98.6 F (37 C) (Oral)   Ht 5\' 5"  (1.651 m)   Wt 257 lb (116.6 kg)   SpO2 97%   BMI 42.77 kg/m    Subjective:    Patient ID: Jessica Brooks, female    DOB: 12-03-1950, 68 y.o.   MRN: [id number]  HPI: Jessica Brooks is a 68 y.o. female  Chief Complaint  Patient presents with  . Urinary Frequency    x 3-4 days   URINARY SYMPTOMS Reports started to have urinary frequency x 3-4 days ago.  States between 7 and 8:30 this morning urinated 3-4 times.  Reports when her blood pressure was high in past she had similar presentation, spots in front of eyes and urinary frequency.  She has not taken BP this morning.  Has not had kidney stones before. Dysuria: states no burning, just hurt a little Urinary frequency: yes Urgency: no Small volume voids: no Symptom severity: no Urinary incontinence: no Foul odor: no Hematuria: no Abdominal pain: no Back pain: yes, mild, 3-4/10 did not start at same time as frequency, has been present for years which she feels is due to weight Suprapubic pain/pressure: no Flank pain: no Fever:  no Vomiting: none Relief with cranberry juice: not taking Relief with pyridium: not taking Status: stable Previous urinary tract infection: no Recurrent urinary tract infection: no Sexual activity: not sexually active History of sexually transmitted disease: no Penile discharge: N/A Treatments attempted: increasing fluids   Relevant past medical, surgical, family and social history reviewed and updated as indicated. Interim medical history since our last visit reviewed. Allergies and medications reviewed and updated.  Review of Systems  Constitutional: Negative for activity change, appetite change, diaphoresis, fatigue and fever.  Respiratory: Negative for cough, chest tightness and shortness of breath.   Cardiovascular: Negative for chest pain, palpitations and leg  swelling.  Gastrointestinal: Negative for abdominal distention, abdominal pain, constipation, diarrhea, nausea and vomiting.  Endocrine: Negative for polydipsia, polyphagia and polyuria.  Genitourinary: Positive for frequency. Negative for dysuria, hematuria, urgency and vaginal discharge.  Neurological: Negative for dizziness, syncope, weakness, light-headedness, numbness and headaches.  Psychiatric/Behavioral: Negative.     Per HPI unless specifically indicated above     Objective:    BP 140/82 (BP Location: Left Arm, Patient Position: Sitting)   Pulse 82   Temp 98.6 F (37 C) (Oral)   Ht 5\' 5"  (1.651 m)   Wt 257 lb (116.6 kg)   SpO2 97%   BMI 42.77 kg/m   Wt Readings from Last 3 Encounters:  04/29/19 257 lb (116.6 kg)  04/06/19 254 lb 9.6 oz (115.5 kg)  01/05/19 254 lb (115.2 kg)    Physical Exam Vitals signs and nursing note reviewed.  Constitutional:      General: She is awake. She is not in acute distress.    Appearance: She is well-developed. She is obese. She is not ill-appearing.  HENT:     Head: Normocephalic.     Right Ear: Hearing normal.     Left Ear: Hearing normal.  Eyes:     General: Lids are normal.        Right eye: No discharge.        Left eye: No discharge.     Extraocular Movements: Extraocular movements intact.     Conjunctiva/sclera: Conjunctivae normal.     Pupils: Pupils are equal, round, and reactive to light.  Visual Fields: Right eye visual fields normal and left eye visual fields normal.  Neck:     Musculoskeletal: Normal range of motion and neck supple.     Thyroid: No thyromegaly.     Vascular: No carotid bruit.  Cardiovascular:     Rate and Rhythm: Normal rate and regular rhythm.     Heart sounds: Normal heart sounds. No murmur. No gallop.   Pulmonary:     Effort: Pulmonary effort is normal. No accessory muscle usage or respiratory distress.     Breath sounds: Normal breath sounds.  Abdominal:     General: Bowel sounds are  normal.     Palpations: Abdomen is soft. There is no hepatomegaly or splenomegaly.     Tenderness: There is no abdominal tenderness. There is no right CVA tenderness or left CVA tenderness.  Musculoskeletal:     Right lower leg: No edema.     Left lower leg: No edema.  Skin:    General: Skin is warm and dry.  Neurological:     Mental Status: She is alert and oriented to person, place, and time.  Psychiatric:        Attention and Perception: Attention normal.        Mood and Affect: Mood normal.        Behavior: Behavior normal. Behavior is cooperative.        Thought Content: Thought content normal.        Judgment: Judgment normal.     Results for orders placed or performed in visit on 04/29/19  WET PREP FOR TRICH, YEAST, CLUE   Specimen: Urine   URINE  Result Value Ref Range   Trichomonas Exam Negative Negative   Yeast Exam Negative Negative   Clue Cell Exam Negative Negative  Microscopic Examination   URINE  Result Value Ref Range   WBC, UA None seen 0 - 5 /hpf   RBC 3-10 (A) 0 - 2 /hpf   Epithelial Cells (non renal) 0-10 0 - 10 /hpf   Bacteria, UA None seen None seen/Few  UA/M w/rflx Culture, Routine   Specimen: Urine   URINE  Result Value Ref Range   Specific Gravity, UA 1.020 1.005 - 1.030   pH, UA 7.0 5.0 - 7.5   Color, UA Yellow Yellow   Appearance Ur Clear Clear   Leukocytes,UA Negative Negative   Protein,UA Negative Negative/Trace   Glucose, UA Negative Negative   Ketones, UA Negative Negative   RBC, UA Trace (A) Negative   Bilirubin, UA Negative Negative   Urobilinogen, Ur 0.2 0.2 - 1.0 mg/dL   Nitrite, UA Negative Negative   Microscopic Examination See below:   Lipid Panel Piccolo, Waived  Result Value Ref Range   Cholesterol Piccolo, Waived 166 <200 mg/dL   HDL Chol Piccolo, Waived 68 >59 mg/dL   Triglycerides Piccolo,Waived 63 <150 mg/dL   Chol/HDL Ratio Piccolo,Waive 2.5 mg/dL   LDL Chol Calc Piccolo Waived 86 <100 mg/dL   VLDL Chol Calc  Piccolo,Waive 13 <30 mg/dL      Assessment & Plan:   Problem List Items Addressed This Visit      Cardiovascular and Mediastinum   Hypertension    Chronic, ongoing with some elevation above goal and symptoms similar to when she has run higher in past.  Increase Amlodipine to 10 MG daily.  CMP today and lipid panel for screening.  Recommend checking BP at home three mornings a week once obtains BP cuff and documenting.  Return in  4 weeks.      Relevant Medications   amLODipine (NORVASC) 10 MG tablet     Other   Morbid obesity with BMI of 40.0-44.9, adult ([place])    Recommend continued focus on health diet choices and regular physical activity (30 minutes 5 days a week).  Recommend making small goals at a time.       Frequency of urination - Primary    UA with trace blood, remainder negative.  Wet prep negative.  Will send for culture and treat as needed. Have return outpatient in one week to assess for hematuria.  CBC today.      Relevant Orders   UA/M w/rflx Culture, Routine (Completed)   WET PREP FOR [place], YEAST, CLUE (Completed)   HgB A1c   UA/M w/rflx Culture, Routine    Other Visit Diagnoses    Vitamin D deficiency       Reports history of low level, check on labs today.   Screening cholesterol level       Monitor annually due to obesity and HTN       Follow up plan: Return in about 4 weeks (around 05/27/2019) for HTN + needs one week lab visit schedule for urine check.

## 2019-04-29 NOTE — Assessment & Plan Note (Signed)
Recommend continued focus on health diet choices and regular physical activity (30 minutes 5 days a week).  Recommend making small goals at a time.

## 2019-04-29 NOTE — Assessment & Plan Note (Signed)
UA with trace blood, remainder negative.  Wet prep negative.  Will send for culture and treat as needed. Have return outpatient in one week to assess for hematuria.  CBC today.

## 2019-04-29 NOTE — Assessment & Plan Note (Signed)
Chronic, ongoing with some elevation above goal and symptoms similar to when she has run higher in past.  Increase Amlodipine to 10 MG daily.  CMP today and lipid panel for screening.  Recommend checking BP at home three mornings a week once obtains BP cuff and documenting.  Return in 4 weeks.

## 2019-04-29 NOTE — Patient Instructions (Signed)
Urinary Frequency, Adult Urinary frequency means urinating more often than usual. You may urinate every 1-2 hours even though you drink a normal amount of fluid and do not have a bladder infection or condition. Although you urinate more often than normal, the total amount of urine produced in a day is normal. With urinary frequency, you may have an urgent need to urinate often. The stress and anxiety of needing to find a bathroom quickly can make this urge worse. This condition may go away on its own or you may need treatment at home. Home treatment may include bladder training, exercises, taking medicines, or making changes to your diet. Follow these instructions at home: Bladder health   Keep a bladder diary if told by your health care provider. Keep track of: ? What you eat and drink. ? How often you urinate. ? How much you urinate.  Follow a bladder training program if told by your health care provider. This may include: ? Learning to delay going to the bathroom. ? Double urinating (voiding). This helps if you are not completely emptying your bladder. ? Scheduled voiding.  Do Kegel exercises as told by your health care provider. Kegel exercises strengthen the muscles that help control urination, which may help the condition. Eating and drinking  If told by your health care provider, make diet changes, such as: ? Avoiding caffeine. ? Drinking fewer fluids, especially alcohol. ? Not drinking in the evening. ? Avoiding foods or drinks that may irritate the bladder. These include coffee, tea, soda, artificial sweeteners, citrus, tomato-based foods, and chocolate. ? Eating foods that help prevent or ease constipation. Constipation can make this condition worse. Your health care provider may recommend that you:  Drink enough fluid to keep your urine pale yellow.  Take over-the-counter or prescription medicines.  Eat foods that are high in fiber, such as beans, whole grains, and fresh  fruits and vegetables.  Limit foods that are high in fat and processed sugars, such as fried or sweet foods. General instructions  Take over-the-counter and prescription medicines only as told by your health care provider.  Keep all follow-up visits as told by your health care provider. This is important. Contact a health care provider if:  You start urinating more often.  You feel pain or irritation when you urinate.  You notice blood in your urine.  Your urine looks cloudy.  You develop a fever.  You begin vomiting. Get help right away if:  You are unable to urinate. Summary  Urinary frequency means urinating more often than usual. With urinary frequency, you may urinate every 1-2 hours even though you drink a normal amount of fluid and do not have a bladder infection or other bladder condition.  Your health care provider may recommend that you keep a bladder diary, follow a bladder training program, or make dietary changes.  If told by your health care provider, do Kegel exercises to strengthen the muscles that help control urination.  Take over-the-counter and prescription medicines only as told by your health care provider.  Contact a health care provider if your symptoms do not improve or get worse. This information is not intended to replace advice given to you by your health care provider. Make sure you discuss any questions you have with your health care provider. Document Released: 03/29/2009 Document Revised: 12/10/2017 Document Reviewed: 12/10/2017 Elsevier Patient Education  2020 [employer]  

## 2019-04-30 LAB — COMPREHENSIVE METABOLIC PANEL
ALT: 17 IU/L (ref 0–32)
AST: 13 IU/L (ref 0–40)
Albumin/Globulin Ratio: 1.4 (ref 1.2–2.2)
Albumin: 4.2 g/dL (ref 3.8–4.8)
Alkaline Phosphatase: 65 IU/L (ref 39–117)
BUN/Creatinine Ratio: 20 (ref 12–28)
BUN: 14 mg/dL (ref 8–27)
Bilirubin Total: 0.2 mg/dL (ref 0.0–1.2)
CO2: 21 mmol/L (ref 20–29)
Calcium: 8.8 mg/dL (ref 8.7–10.3)
Chloride: 102 mmol/L (ref 96–106)
Creatinine, Ser: 0.71 mg/dL (ref 0.57–1.00)
GFR calc Af Amer: 100 mL/min/{1.73_m2} (ref >59)
GFR calc non Af Amer: 87 mL/min/{1.73_m2} (ref >59)
Globulin, Total: 3.1 g/dL (ref 1.5–4.5)
Glucose: 89 mg/dL (ref 65–99)
Potassium: 4.2 mmol/L (ref 3.5–5.2)
Sodium: 140 mmol/L (ref 134–144)
Total Protein: 7.3 g/dL (ref 6.0–8.5)

## 2019-04-30 LAB — CBC WITH DIFFERENTIAL/PLATELET
Basophils Absolute: 0 10*3/uL (ref 0.0–0.2)
Basos: 1 %
EOS (ABSOLUTE): 0.1 10*3/uL (ref 0.0–0.4)
Eos: 2 %
Hematocrit: 38.9 % (ref 34.0–46.6)
Hemoglobin: 13.1 g/dL (ref 11.1–15.9)
Immature Grans (Abs): 0 10*3/uL (ref 0.0–0.1)
Immature Granulocytes: 0 %
Lymphocytes Absolute: 1.5 10*3/uL (ref 0.7–3.1)
Lymphs: 38 %
MCH: 28.1 pg (ref 26.6–33.0)
MCHC: 33.7 g/dL (ref 31.5–35.7)
MCV: 84 fL (ref 79–97)
Monocytes Absolute: 0.3 10*3/uL (ref 0.1–0.9)
Monocytes: 8 %
Neutrophils Absolute: 1.9 10*3/uL (ref 1.4–7.0)
Neutrophils: 51 %
Platelets: 230 10*3/uL (ref 150–450)
RBC: 4.66 x10E6/uL (ref 3.77–5.28)
RDW: 14.5 % (ref 11.7–15.4)
WBC: 3.8 10*3/uL (ref 3.4–10.8)

## 2019-04-30 LAB — VITAMIN D 25 HYDROXY (VIT D DEFICIENCY, FRACTURES): Vit D, 25-Hydroxy: 45.9 ng/mL (ref 30.0–100.0)

## 2019-04-30 LAB — HEMOGLOBIN A1C
Est. average glucose Bld gHb Est-mCnc: 111 mg/dL
Hgb A1c MFr Bld: 5.5 % (ref 4.8–5.6)

## 2019-05-01 ENCOUNTER — Other Ambulatory Visit: Payer: Self-pay | Admitting: Nurse Practitioner

## 2019-05-02 ENCOUNTER — Other Ambulatory Visit: Payer: Self-pay | Admitting: Nurse Practitioner

## 2019-05-02 MED ORDER — MONTELUKAST SODIUM 10 MG PO TABS: 10.0000 mg | ORAL_TABLET | Freq: Every day | ORAL | 0 refills | Status: DC

## 2019-05-05 ENCOUNTER — Other Ambulatory Visit: Payer: Self-pay

## 2019-05-06 ENCOUNTER — Other Ambulatory Visit: Payer: Self-pay

## 2019-05-06 ENCOUNTER — Other Ambulatory Visit: Payer: Medicare HMO

## 2019-05-06 DIAGNOSIS — R350 Frequency of micturition: Secondary | ICD-10-CM | POA: Diagnosis not present

## 2019-05-06 LAB — UA/M W/RFLX CULTURE, ROUTINE
Bilirubin, UA: NEGATIVE
Glucose, UA: NEGATIVE
Ketones, UA: NEGATIVE
Leukocytes,UA: NEGATIVE
Nitrite, UA: NEGATIVE
Protein,UA: NEGATIVE
RBC, UA: NEGATIVE
Specific Gravity, UA: 1.02 (ref 1.005–1.030)
Urobilinogen, Ur: 0.2 mg/dL (ref 0.2–1.0)
pH, UA: 7 (ref 5.0–7.5)

## 2019-05-23 ENCOUNTER — Encounter: Payer: Self-pay | Admitting: Nurse Practitioner

## 2019-05-27 ENCOUNTER — Other Ambulatory Visit: Payer: Self-pay | Admitting: Nurse Practitioner

## 2019-05-30 ENCOUNTER — Ambulatory Visit (INDEPENDENT_AMBULATORY_CARE_PROVIDER_SITE_OTHER): Payer: Medicare HMO | Admitting: Nurse Practitioner

## 2019-05-30 ENCOUNTER — Encounter: Payer: Self-pay | Admitting: Nurse Practitioner

## 2019-05-30 ENCOUNTER — Other Ambulatory Visit: Payer: Self-pay

## 2019-05-30 DIAGNOSIS — I10 Essential (primary) hypertension: Secondary | ICD-10-CM

## 2019-05-30 NOTE — Assessment & Plan Note (Signed)
Chronic, ongoing.  No BP cuff and unable to assess BP at home at this time, she plans on calling today to see if BP cuff in.  She has ordered.  Continue current medication regimen at this time and plan for follow-up in 4 weeks (if BP cuff obtained can be virtual, but if not then in office).

## 2019-05-30 NOTE — Progress Notes (Signed)
Unable to LVM.

## 2019-05-30 NOTE — Patient Instructions (Signed)

## 2019-05-30 NOTE — Progress Notes (Signed)
There were no vitals taken for this visit.   Subjective:    Patient ID: Jessica Brooks, female    DOB: 08-24-1950, 68 y.o.   MRN: [id number]  HPI: Jessica Brooks is a 68 y.o. female  Chief Complaint  Patient presents with  . Hypertension    pt states she did not receive her BP cuff yet     . This visit was completed via Doximity due to the restrictions of the COVID-19 pandemic. All issues as above were discussed and addressed. Physical exam was done as above through visual confirmation on doximity. If it was felt that the patient should be evaluated in the office, they were directed there. The patient verbally consented to this visit. . Location of the patient: home . Location of the provider: work . Those involved with this call:  . Provider: Marnee Guarneri, DNP . CMA: Yvonna Alanis, CMA . Front Desk/Registration: Jill Side  . Time spent on call: 15 minutes with patient face to face via video conference. More than 50% of this time was spent in counseling and coordination of care. 10 minutes total spent in review of patient's record and preparation of their chart.  . I verified patient identity using two factors (patient name and date of birth). Patient consents verbally to being seen via telemedicine visit today.    HYPERTENSION Amlodipine increase to 10 MG in November, last visit.  Does not have BP cuff yet, none are currently in.  She denies ADR with medication increase. Hypertension status: stable  Satisfied with current treatment? yes Duration of hypertension: chronic BP monitoring frequency:  not checking BP range:  BP medication side effects:  no Medication compliance: good compliance Aspirin: no Recurrent headaches: no Visual changes: no Palpitations: no Dyspnea: no Chest pain: no Lower extremity edema: no Dizzy/lightheaded: no  Relevant past medical, surgical, family and social history reviewed and updated as indicated. Interim medical  history since our last visit reviewed. Allergies and medications reviewed and updated.  Review of Systems  Constitutional: Negative for activity change, appetite change, diaphoresis, fatigue and fever.  Respiratory: Negative for cough, chest tightness and shortness of breath.   Cardiovascular: Negative for chest pain, palpitations and leg swelling.  Gastrointestinal: Negative.   Neurological: Negative.   Psychiatric/Behavioral: Negative.     Per HPI unless specifically indicated above     Objective:    There were no vitals taken for this visit.  Wt Readings from Last 3 Encounters:  04/29/19 257 lb (116.6 kg)  04/06/19 254 lb 9.6 oz (115.5 kg)  01/05/19 254 lb (115.2 kg)    Physical Exam Vitals and nursing note reviewed.  Constitutional:      General: She is awake. She is not in acute distress.    Appearance: She is well-developed. She is not ill-appearing.  HENT:     Head: Normocephalic.     Right Ear: Hearing normal.     Left Ear: Hearing normal.  Eyes:     General: Lids are normal.        Right eye: No discharge.        Left eye: No discharge.     Conjunctiva/sclera: Conjunctivae normal.  Pulmonary:     Effort: Pulmonary effort is normal. No accessory muscle usage or respiratory distress.  Musculoskeletal:     Cervical back: Normal range of motion.  Neurological:     Mental Status: She is alert and oriented to person, place, and time.  Psychiatric:  Attention and Perception: Attention normal.        Mood and Affect: Mood normal.        Behavior: Behavior normal. Behavior is cooperative.        Thought Content: Thought content normal.        Judgment: Judgment normal.     Results for orders placed or performed in visit on 05/06/19  UA/M w/rflx Culture, Routine   Specimen: Urine   URINE  Result Value Ref Range   Specific Gravity, UA 1.020 1.005 - 1.030   pH, UA 7.0 5.0 - 7.5   Color, UA Yellow Yellow   Appearance Ur Clear Clear   Leukocytes,UA  Negative Negative   Protein,UA Negative Negative/Trace   Glucose, UA Negative Negative   Ketones, UA Negative Negative   RBC, UA Negative Negative   Bilirubin, UA Negative Negative   Urobilinogen, Ur 0.2 0.2 - 1.0 mg/dL   Nitrite, UA Negative Negative      Assessment & Plan:   Problem List Items Addressed This Visit      Cardiovascular and Mediastinum   Hypertension    Chronic, ongoing.  No BP cuff and unable to assess BP at home at this time, she plans on calling today to see if BP cuff in.  She has ordered.  Continue current medication regimen at this time and plan for follow-up in 4 weeks (if BP cuff obtained can be virtual, but if not then in office).           I discussed the assessment and treatment plan with the patient. The patient was provided an opportunity to ask questions and all were answered. The patient agreed with the plan and demonstrated an understanding of the instructions.   The patient was advised to call back or seek an in-person evaluation if the symptoms worsen or if the condition fails to improve as anticipated.   I provided 15 minutes of time during this encounter.  Follow up plan: Return in about 4 weeks (around 06/27/2019) for HTN and labs.

## 2019-06-20 DIAGNOSIS — E0500 Thyrotoxicosis with diffuse goiter without thyrotoxic crisis or storm: Secondary | ICD-10-CM | POA: Diagnosis not present

## 2019-06-24 ENCOUNTER — Ambulatory Visit: Payer: Medicare HMO | Admitting: Nurse Practitioner

## 2019-06-27 DIAGNOSIS — E890 Postprocedural hypothyroidism: Secondary | ICD-10-CM | POA: Diagnosis not present

## 2019-06-29 ENCOUNTER — Encounter: Payer: Self-pay | Admitting: Unknown Physician Specialty

## 2019-06-29 ENCOUNTER — Other Ambulatory Visit: Payer: Self-pay

## 2019-06-29 ENCOUNTER — Ambulatory Visit (INDEPENDENT_AMBULATORY_CARE_PROVIDER_SITE_OTHER): Payer: Medicare HMO | Admitting: Unknown Physician Specialty

## 2019-06-29 VITALS — BP 142/86 | HR 96 | Temp 98.1°F

## 2019-06-29 DIAGNOSIS — I10 Essential (primary) hypertension: Secondary | ICD-10-CM

## 2019-06-29 DIAGNOSIS — R319 Hematuria, unspecified: Secondary | ICD-10-CM | POA: Diagnosis not present

## 2019-06-29 LAB — UA/M W/RFLX CULTURE, ROUTINE
Bilirubin, UA: NEGATIVE
Glucose, UA: NEGATIVE
Ketones, UA: NEGATIVE
Leukocytes,UA: NEGATIVE
Nitrite, UA: NEGATIVE
Protein,UA: NEGATIVE
RBC, UA: NEGATIVE
Specific Gravity, UA: 1.025 (ref 1.005–1.030)
Urobilinogen, Ur: 0.2 mg/dL (ref 0.2–1.0)
pH, UA: 5.5 (ref 5.0–7.5)

## 2019-06-29 MED ORDER — LISINOPRIL 5 MG PO TABS: 5.0000 mg | ORAL_TABLET | Freq: Every day | ORAL | 3 refills | Status: DC

## 2019-06-29 NOTE — Assessment & Plan Note (Signed)
Taking 10 mg Amlodipine.  BP not to goal.  Intolerant to HCTZ.  Will add Lisinopril

## 2019-06-29 NOTE — Progress Notes (Signed)
BP (!) 142/86 (BP Location: Left Arm, Patient Position: Sitting, Cuff Size: Normal)   Pulse 96   Temp 98.1 F (36.7 C) (Oral)   SpO2 98%    Subjective:    Patient ID: Jessica Brooks, female    DOB: [birth year], 69 y.o.   MRN: [id number]  HPI: Jessica Brooks is a 69 y.o. female  Chief Complaint  Patient presents with  . Hypertension    4wk follow up   Hypertension Pt seen one month ago for urinary frequency and hematuria. Repeat urinalysis planned for 3 weeks ago which was not done.  Home BP is about 145-85.  No chest pain, SOB, or edema.  Urinary frequency comes and goe   Relevant past medical, surgical, family and social history reviewed and updated as indicated. Interim medical history since our last visit reviewed. Allergies and medications reviewed and updated.  Review of Systems  Per HPI unless specifically indicated above     Objective:    BP (!) 142/86 (BP Location: Left Arm, Patient Position: Sitting, Cuff Size: Normal)   Pulse 96   Temp 98.1 F (36.7 C) (Oral)   SpO2 98%   Wt Readings from Last 3 Encounters:  04/29/19 257 lb (116.6 kg)  04/06/19 254 lb 9.6 oz (115.5 kg)  01/05/19 254 lb (115.2 kg)    Physical Exam Constitutional:      General: She is not in acute distress.    Appearance: Normal appearance. She is well-developed.  HENT:     Head: Normocephalic and atraumatic.  Eyes:     General: Lids are normal. No scleral icterus.       Right eye: No discharge.        Left eye: No discharge.     Conjunctiva/sclera: Conjunctivae normal.  Neck:     Vascular: No carotid bruit or JVD.  Cardiovascular:     Rate and Rhythm: Normal rate and regular rhythm.     Heart sounds: Normal heart sounds.  Pulmonary:     Effort: Pulmonary effort is normal.     Breath sounds: Normal breath sounds.  Abdominal:     Palpations: There is no hepatomegaly or splenomegaly.  Musculoskeletal:        General: Normal range of motion.     Cervical back: Normal  range of motion and neck supple.  Skin:    General: Skin is warm and dry.     Coloration: Skin is not pale.     Findings: No rash.  Neurological:     Mental Status: She is alert and oriented to person, place, and time.  Psychiatric:        Behavior: Behavior normal.        Thought Content: Thought content normal.        Judgment: Judgment normal.     Results for orders placed or performed in visit on 05/06/19  UA/M w/rflx Culture, Routine   Specimen: Urine   URINE  Result Value Ref Range   Specific Gravity, UA 1.020 1.005 - 1.030   pH, UA 7.0 5.0 - 7.5   Color, UA Yellow Yellow   Appearance Ur Clear Clear   Leukocytes,UA Negative Negative   Protein,UA Negative Negative/Trace   Glucose, UA Negative Negative   Ketones, UA Negative Negative   RBC, UA Negative Negative   Bilirubin, UA Negative Negative   Urobilinogen, Ur 0.2 0.2 - 1.0 mg/dL   Nitrite, UA Negative Negative      Assessment & Plan:  Problem List Items Addressed This Visit      Unprioritized   Hypertension    Taking 10 mg Amlodipine.  BP not to goal.  Intolerant to HCTZ.  Will add Lisinopril        Relevant Medications   lisinopril (ZESTRIL) 5 MG tablet    Other Visit Diagnoses    Hematuria, unspecified type    -  Primary   Not noted today.  Normal urine   Relevant Orders   UA/M w/rflx Culture, Routine       Follow up plan: Return in about 4 weeks (around 07/27/2019).  OK for telehealth visit

## 2019-07-11 ENCOUNTER — Other Ambulatory Visit: Payer: Self-pay | Admitting: Nurse Practitioner

## 2019-07-12 ENCOUNTER — Encounter: Payer: Self-pay | Admitting: Nurse Practitioner

## 2019-07-12 ENCOUNTER — Ambulatory Visit (INDEPENDENT_AMBULATORY_CARE_PROVIDER_SITE_OTHER): Payer: Medicare HMO | Admitting: Nurse Practitioner

## 2019-07-12 ENCOUNTER — Other Ambulatory Visit: Payer: Self-pay

## 2019-07-12 VITALS — BP 131/83 | HR 95 | Temp 98.4°F | Ht 64.5 in | Wt 262.2 lb

## 2019-07-12 DIAGNOSIS — M85851 Other specified disorders of bone density and structure, right thigh: Secondary | ICD-10-CM | POA: Diagnosis not present

## 2019-07-12 DIAGNOSIS — Z6841 Body Mass Index (BMI) 40.0 and over, adult: Secondary | ICD-10-CM | POA: Diagnosis not present

## 2019-07-12 DIAGNOSIS — I10 Essential (primary) hypertension: Secondary | ICD-10-CM

## 2019-07-12 DIAGNOSIS — R7989 Other specified abnormal findings of blood chemistry: Secondary | ICD-10-CM | POA: Diagnosis not present

## 2019-07-12 DIAGNOSIS — J309 Allergic rhinitis, unspecified: Secondary | ICD-10-CM

## 2019-07-12 DIAGNOSIS — J4520 Mild intermittent asthma, uncomplicated: Secondary | ICD-10-CM | POA: Diagnosis not present

## 2019-07-12 DIAGNOSIS — Z0000 Encounter for general adult medical examination without abnormal findings: Secondary | ICD-10-CM

## 2019-07-12 MED ORDER — MONTELUKAST SODIUM 10 MG PO TABS: 10.0000 mg | ORAL_TABLET | Freq: Every day | ORAL | 3 refills | Status: DC

## 2019-07-12 NOTE — Assessment & Plan Note (Signed)
Chronic, stable.  BP at goal on home readings and in office.  Continue Lisinopril and Amlodipine and adjust as needed.  Obtain labs today.  Continue to monitor BP at home a few days a week.  Return in 6 months.

## 2019-07-12 NOTE — Assessment & Plan Note (Signed)
Noted on DEXA 2017, repeat need 2022.  Continue daily supplements, check Vit d level today.

## 2019-07-12 NOTE — Assessment & Plan Note (Signed)
Followed by Dr. Solum.  Continue this collaboration, recent note reviewed with patient. 

## 2019-07-12 NOTE — Assessment & Plan Note (Signed)
Recommend continued focus on healthy diet choices and regular physical activity (30 minutes 5 days a week).  Focus on small goals at a time. 

## 2019-07-12 NOTE — Patient Instructions (Signed)
We are recommending the vaccine to everyone who has not had an allergic reaction to any of the components of the vaccine. If you have specific questions about the vaccine, please bring them up with your health care provider to discuss them.   We will likely not be getting the vaccine in the office for the first rounds of vaccinations. The way they are releasing the vaccines is going to be through the health systems (like [place], [place], [facility], [place]) or through your county health department.   [facility] is giving vaccines to those 75+ starting 06/22/19  M-F 7AM to 4PM [facility] [address], [place], [place] [facility] in a drive through tent  If you are 65+ you can get a vaccine through [facility] by signing up for an appointment.  You can sign up by going to: [email or web address].  You can get more information by going to: [email or web address]  [employer] DEBROX --- in drugstore

## 2019-07-12 NOTE — Assessment & Plan Note (Signed)
Chronic, stable with minimal use of Albuterol.  Continue current medication regimen and adjust as needed.  Consider spirometry next visit.

## 2019-07-12 NOTE — Assessment & Plan Note (Signed)
Chronic, stable.  Continue current medication regimen and adjust as needed.  Refills on Singulair. 

## 2019-07-12 NOTE — Progress Notes (Signed)
BP 131/83   Pulse 95   Temp 98.4 F (36.9 C) (Oral)   Ht 5' 4.5" (1.638 m)   Wt 262 lb 3.2 oz (118.9 kg)   SpO2 98%   BMI 44.31 kg/m    Subjective:    Patient ID: Jessica Brooks, female    DOB: December 03, 1950, 69 y.o.   MRN: [id number]  HPI: Jessica Brooks is a 69 y.o. female presenting on 07/12/2019 for comprehensive medical examination. Current medical complaints include:none  She currently lives with: self Menopausal Symptoms: no   HYPERTENSION Continues on Amlodipine and Lisinopril (added at recent visit and tolerating well). Hypertension status: stable  Satisfied with current treatment? yes Duration of hypertension: chronic BP monitoring frequency:  daily BP range: 120/70'd range at home BP medication side effects:  no Medication compliance: good compliance Aspirin: no Recurrent headaches: no Visual changes: no Palpitations: no Dyspnea: no Chest pain: no Lower extremity edema: no Dizzy/lightheaded: no   HYPOTHYRODISM Sees Dr. Tedd Sias for toxic multinodular goiter s/p radioiodine ablation.  She recently increased Levothyroxine 75 MCG on 06/27/2019.  Recent TSH 7.270. Thyroid control status:stable Satisfied with current treatment? yes Medication side effects: no Medication compliance: good compliance Etiology of hypothyroidism:  Recent dose adjustment:yes increased to 75 MCG Fatigue: no Cold intolerance: no Heat intolerance: no Weight gain: no Weight loss: no Constipation: no Diarrhea/loose stools: no Palpitations: no Lower extremity edema: no Anxiety/depressed mood: no   OSTEOPENIA Last Dexa in 2017 with osteopenia noted.  Continues on daily Vitamin D and taking in as much calcium as possible.  No recent falls or fractures.  ALLERGIES Well controlled on Singulair Duration: chronic Runny nose: no none Nasal congestion: no Nasal itching: no Sneezing: no Eye swelling, itching or discharge: no Post nasal drip: no Cough: no Sinus pressure:  none  Ear pain: none Ear pressure: none Fever: none Symptoms occur seasonally: yes Symptoms occur perenially: no Satisfied with current treatment: yes  ASTHMA Asthma status: stable Satisfied with current treatment?: yes Albuterol/rescue inhaler frequency: once every few months Dyspnea frequency: none Wheezing frequency: none Cough frequency: none Nocturnal symptom frequency: none Limitation of activity: no Current upper respiratory symptoms: no Triggers: seasonal allergies Home peak flows: none Last Spirometry: unknown Failed/intolerant to following asthma meds:  Asthma meds in past: albuterol Aerochamber/spacer use: no Visits to ER or Urgent Care in past year: no Pneumovax: Up to Date Influenza: Up to Date   Depression Screen done today and results listed below:  Depression screen [facility] 2/9 07/12/2019 04/06/2019 04/02/2018 04/02/2018 04/08/2017  Decreased Interest 0 0 0 0 0  Down, Depressed, Hopeless 0 0 1 1 0  PHQ - 2 Score 0 0 1 1 0  Altered sleeping - - 2 2 0  Tired, decreased energy - - 1 1 1   Change in appetite - - 0 0 0  Feeling bad or failure about yourself  - - 1 1 0  Trouble concentrating - - 0 0 0  Moving slowly or fidgety/restless - - 0 0 0  Suicidal thoughts - - 0 0 0  PHQ-9 Score - - 5 5 1   Difficult doing work/chores - - - - Not difficult at all    The patient does not have a history of falls. I did not complete a risk assessment for falls. A plan of care for falls was not documented.   Past Medical History:  Past Medical History:  Diagnosis Date  . Arthritis   . Asthma    uses inhaler  .  Cataract   . Hypertension   . Keloid     Surgical History:  Past Surgical History:  Procedure Laterality Date  . ABDOMINAL HYSTERECTOMY    . BREAST BIOPSY Right    needle bx years ago unsure of date  . BREAST BIOPSY Right    another bx done by [facility]   . CATARACT EXTRACTION W/PHACO Left 06/11/2017   Procedure: CATARACT EXTRACTION PHACO AND INTRAOCULAR  LENS PLACEMENT (IOC)-LEFT;  Surgeon: Lockie Mola, MD;  Location: [facility] ORS;  Service: Ophthalmology;  Laterality: Left;  LOT # [id number] H [place] :34.4 AP 15.3% CDE 5.29  . 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 MULTIPLE TOOTH EXTRACTIONS      Medications:  Current Outpatient Medications on File Prior to Visit  Medication Sig  . albuterol (VENTOLIN HFA) 108 (90 Base) MCG/ACT inhaler Inhale 2 puffs into the lungs every 6 (six) hours as needed for wheezing or shortness of breath.  Marland Kitchen amLODipine (NORVASC) 10 MG tablet Take 1 tablet (10 mg total) by mouth daily.  . Cholecalciferol (VITAMIN D PO) Take 1 capsule by mouth daily.   Marland Kitchen levothyroxine (SYNTHROID) 75 MCG tablet Take 1 tablet by mouth daily.  Marland Kitchen lisinopril (ZESTRIL) 5 MG tablet Take 1 tablet (5 mg total) by mouth daily.  . meloxicam (MOBIC) 15 MG tablet TAKE 1 TABLET EVERY DAY  . mupirocin ointment (BACTROBAN) 2 % Apply 1 application topically 2 (two) times daily.   No current facility-administered medications on file prior to visit.    Allergies:  Allergies  Allergen Reactions  . Hydrochlorothiazide Palpitations    Jittery    Social History:  Social History   Socioeconomic History  . Marital status: Divorced    Spouse name: Not on file  . Number of children: Not on file  . Years of education: Not on file  . Highest education level: Not on file  Occupational History  . Not on file  Tobacco Use  . Smoking status: Former Smoker    Quit date: 06/16/1978    Years since quitting: 41.0  . Smokeless tobacco: Never Used  Substance and Sexual Activity  . Alcohol use: No  . Drug use: No  . Sexual activity: Never  Other Topics Concern  . Not on file  Social History Narrative  . Not on file   Social Determinants of Health   Financial Resource Strain: Low Risk   . Difficulty of Paying Living Expenses: Not very hard  Food Insecurity: No Food Insecurity  . Worried About Programme researcher, broadcasting/film/video in the  Last Year: Never true  . Ran Out of Food in the Last Year: Never true  Transportation Needs: Unmet Transportation Needs  . Lack of Transportation (Medical): Yes  . Lack of Transportation (Non-Medical): No  Physical Activity: Sufficiently Active  . Days of Exercise per Week: 7 days  . Minutes of Exercise per Session: 30 min  Stress: No Stress Concern Present  . Feeling of Stress : Not at all  Social Connections: Moderately Isolated  . Frequency of Communication with Friends and Family: More than three times a week  . Frequency of Social Gatherings with Friends and Family: Twice a week  . Attends Religious Services: Never  . Active Member of Clubs or Organizations: No  . Attends Banker Meetings: Never  . Marital Status: Divorced  Catering manager Violence: Not At Risk  . Fear of Current or Ex-Partner: No  . Emotionally Abused: No  .  Physically Abused: No  . Sexually Abused: No   Social History   Tobacco Use  Smoking Status Former Smoker  . Quit date: 06/16/1978  . Years since quitting: 41.0  Smokeless Tobacco Never Used   Social History   Substance and Sexual Activity  Alcohol Use No    Family History:  Family History  Problem Relation Age of Onset  . Diabetes Mother   . Hypertension Mother   . Cancer Father        throat  . Diabetes Sister   . Kidney disease Sister   . Diabetes Brother   . Kidney disease Brother     Past medical history, surgical history, medications, allergies, family history and social history reviewed with patient today and changes made to appropriate areas of the chart.   Review of Systems - negative All other ROS negative except what is listed above and in the HPI.      Objective:    BP 131/83   Pulse 95   Temp 98.4 F (36.9 C) (Oral)   Ht 5' 4.5" (1.638 m)   Wt 262 lb 3.2 oz (118.9 kg)   SpO2 98%   BMI 44.31 kg/m   Wt Readings from Last 3 Encounters:  07/12/19 262 lb 3.2 oz (118.9 kg)  04/29/19 257 lb (116.6 kg)   04/06/19 254 lb 9.6 oz (115.5 kg)    Physical Exam Constitutional:      General: She is awake. She is not in acute distress.    Appearance: She is well-developed. She is morbidly obese. She is not ill-appearing.  HENT:     Head: Normocephalic and atraumatic.     Right Ear: Hearing, tympanic membrane, ear canal and external ear normal. No drainage.     Left Ear: Hearing, tympanic membrane, ear canal and external ear normal. No drainage.     Nose: Nose normal.     Right Sinus: No maxillary sinus tenderness or frontal sinus tenderness.     Left Sinus: No maxillary sinus tenderness or frontal sinus tenderness.     Mouth/Throat:     Mouth: Mucous membranes are moist.     Pharynx: Oropharynx is clear. Uvula midline. No pharyngeal swelling, oropharyngeal exudate or posterior oropharyngeal erythema.  Eyes:     General: Lids are normal.        Right eye: No discharge.        Left eye: No discharge.     Extraocular Movements: Extraocular movements intact.     Conjunctiva/sclera: Conjunctivae normal.     Pupils: Pupils are equal, round, and reactive to light.     Visual Fields: Right eye visual fields normal and left eye visual fields normal.  Neck:     Thyroid: No thyromegaly.     Vascular: No carotid bruit.     Trachea: Trachea normal.  Cardiovascular:     Rate and Rhythm: Normal rate and regular rhythm.     Heart sounds: Normal heart sounds. No murmur. No gallop.   Pulmonary:     Effort: Pulmonary effort is normal. No accessory muscle usage or respiratory distress.     Breath sounds: Normal breath sounds.  Chest:     Comments: Deferred per patient request Abdominal:     General: Bowel sounds are normal.     Palpations: Abdomen is soft. There is no hepatomegaly or splenomegaly.     Tenderness: There is no abdominal tenderness.  Musculoskeletal:        General: Normal range of motion.  Cervical back: Normal range of motion and neck supple.     Right lower leg: No edema.      Left lower leg: No edema.  Lymphadenopathy:     Head:     Right side of head: No submental, submandibular, tonsillar, preauricular or posterior auricular adenopathy.     Left side of head: No submental, submandibular, tonsillar, preauricular or posterior auricular adenopathy.     Cervical: No cervical adenopathy.  Skin:    General: Skin is warm and dry.     Capillary Refill: Capillary refill takes less than 2 seconds.     Findings: No rash.  Neurological:     Mental Status: She is alert and oriented to person, place, and time.     Cranial Nerves: Cranial nerves are intact.     Gait: Gait is intact.     Deep Tendon Reflexes: Reflexes are normal and symmetric.     Reflex Scores:      Brachioradialis reflexes are 2+ on the right side and 2+ on the left side.      Patellar reflexes are 2+ on the right side and 2+ on the left side. Psychiatric:        Attention and Perception: Attention normal.        Mood and Affect: Mood normal.        Speech: Speech normal.        Behavior: Behavior normal. Behavior is cooperative.        Thought Content: Thought content normal.        Judgment: Judgment normal.     Results for orders placed or performed in visit on 06/29/19  UA/M w/rflx Culture, Routine   Specimen: Urine   URINE  Result Value Ref Range   Specific Gravity, UA 1.025 1.005 - 1.030   pH, UA 5.5 5.0 - 7.5   Color, UA Yellow Yellow   Appearance Ur Clear Clear   Leukocytes,UA Negative Negative   Protein,UA Negative Negative/Trace   Glucose, UA Negative Negative   Ketones, UA Negative Negative   RBC, UA Negative Negative   Bilirubin, UA Negative Negative   Urobilinogen, Ur 0.2 0.2 - 1.0 mg/dL   Nitrite, UA Negative Negative      Assessment & Plan:   Problem List Items Addressed This Visit      Cardiovascular and Mediastinum   Hypertension    Chronic, stable.  BP at goal on home readings and in office.  Continue Lisinopril and Amlodipine and adjust as needed.  Obtain labs  today.  Continue to monitor BP at home a few days a week.  Return in 6 months.      Relevant Orders   Comprehensive metabolic panel     Respiratory   Asthma    Chronic, stable with minimal use of Albuterol.  Continue current medication regimen and adjust as needed.  Consider spirometry next visit.      Relevant Medications   montelukast (SINGULAIR) 10 MG tablet   Allergic rhinitis    Chronic, stable.  Continue current medication regimen and adjust as needed.  Refills on Singulair.      Relevant Orders   CBC with Differential/Platelet     Musculoskeletal and Integument   Osteopenia of neck of right femur    Noted on DEXA 2017, repeat need 2022.  Continue daily supplements, check Vit d level today.      Relevant Orders   VITAMIN D 25 Hydroxy (Vit-D Deficiency, Fractures)     Other  Morbid obesity with BMI of 40.0-44.9, adult (HCC)    Recommend continued focus on healthy diet choices and regular physical activity (30 minutes 5 days a week).  Focus on small goals at a time.       Relevant Orders   Lipid Panel w/o Chol/HDL Ratio   Low TSH level    Followed by Dr. Tedd SiasSolum.  Continue this collaboration, recent note reviewed with patient.       Other Visit Diagnoses    Annual physical exam    -  Primary   Annual labs to include TSH, Lipid, CBC, CMP       Follow up plan: Return in about 6 months (around 01/09/2020) for HTN, Asthma, Allergies.   LABORATORY TESTING:  - Pap smear: not applicable  IMMUNIZATIONS:   - Tdap: Tetanus vaccination status reviewed: last tetanus booster within 10 years - Influenza: Up to date - Pneumovax: Up to date - Prevnar: Up to date - HPV: Not applicable - Zostavax vaccine: Up to date  SCREENING: -Mammogram: Up to date  - Colonoscopy: Up to date  - Bone Density: Up to date -- next due 2022, osteopenia in 2017 -Hearing Test: Not applicable  -Spirometry: Not applicable   PATIENT COUNSELING:   Advised to take 1 mg of folate supplement  per day if capable of pregnancy.   Sexuality: Discussed sexually transmitted diseases, partner selection, use of condoms, avoidance of unintended pregnancy  and contraceptive alternatives.   Advised to avoid cigarette smoking.  I discussed with the patient that most people either abstain from alcohol or drink within safe limits (<=14/week and <=4 drinks/occasion for males, <=7/weeks and <= 3 drinks/occasion for females) and that the risk for alcohol disorders and other health effects rises proportionally with the number of drinks per week and how often a drinker exceeds daily limits.  Discussed cessation/primary prevention of drug use and availability of treatment for abuse.   Diet: Encouraged to adjust caloric intake to maintain  or achieve ideal body weight, to reduce intake of dietary saturated fat and total fat, to limit sodium intake by avoiding high sodium foods and not adding table salt, and to maintain adequate dietary potassium and calcium preferably from fresh fruits, vegetables, and low-fat dairy products.    stressed the importance of regular exercise  Injury prevention: Discussed safety belts, safety helmets, smoke detector, smoking near bedding or upholstery.   Dental health: Discussed importance of regular tooth brushing, flossing, and dental visits.    NEXT PREVENTATIVE PHYSICAL DUE IN 1 YEAR. Return in about 6 months (around 01/09/2020) for HTN, Asthma, Allergies.

## 2019-07-13 LAB — CBC WITH DIFFERENTIAL/PLATELET
Basophils Absolute: 0 10*3/uL (ref 0.0–0.2)
Basos: 1 %
EOS (ABSOLUTE): 0 10*3/uL (ref 0.0–0.4)
Eos: 0 %
Hematocrit: 38.7 % (ref 34.0–46.6)
Hemoglobin: 12.5 g/dL (ref 11.1–15.9)
Immature Grans (Abs): 0 10*3/uL (ref 0.0–0.1)
Immature Granulocytes: 0 %
Lymphocytes Absolute: 2.1 10*3/uL (ref 0.7–3.1)
Lymphs: 28 %
MCH: 27.7 pg (ref 26.6–33.0)
MCHC: 32.3 g/dL (ref 31.5–35.7)
MCV: 86 fL (ref 79–97)
Monocytes Absolute: 0.8 10*3/uL (ref 0.1–0.9)
Monocytes: 10 %
Neutrophils Absolute: 4.7 10*3/uL (ref 1.4–7.0)
Neutrophils: 61 %
Platelets: 259 10*3/uL (ref 150–450)
RBC: 4.51 x10E6/uL (ref 3.77–5.28)
RDW: 14.6 % (ref 11.7–15.4)
WBC: 7.6 10*3/uL (ref 3.4–10.8)

## 2019-07-13 LAB — LIPID PANEL W/O CHOL/HDL RATIO
Cholesterol, Total: 171 mg/dL (ref 100–199)
HDL: 59 mg/dL (ref >39)
LDL Chol Calc (NIH): 97 mg/dL (ref 0–99)
Triglycerides: 78 mg/dL (ref 0–149)
VLDL Cholesterol Cal: 15 mg/dL (ref 5–40)

## 2019-07-13 LAB — COMPREHENSIVE METABOLIC PANEL
ALT: 15 IU/L (ref 0–32)
AST: 12 IU/L (ref 0–40)
Albumin/Globulin Ratio: 1.5 (ref 1.2–2.2)
Albumin: 4.3 g/dL (ref 3.8–4.8)
Alkaline Phosphatase: 75 IU/L (ref 39–117)
BUN/Creatinine Ratio: 21 (ref 12–28)
BUN: 14 mg/dL (ref 8–27)
Bilirubin Total: 0.2 mg/dL (ref 0.0–1.2)
CO2: 24 mmol/L (ref 20–29)
Calcium: 9 mg/dL (ref 8.7–10.3)
Chloride: 103 mmol/L (ref 96–106)
Creatinine, Ser: 0.68 mg/dL (ref 0.57–1.00)
GFR calc Af Amer: 102 mL/min/{1.73_m2} (ref >59)
GFR calc non Af Amer: 89 mL/min/{1.73_m2} (ref >59)
Globulin, Total: 2.8 g/dL (ref 1.5–4.5)
Glucose: 87 mg/dL (ref 65–99)
Potassium: 4.5 mmol/L (ref 3.5–5.2)
Sodium: 141 mmol/L (ref 134–144)
Total Protein: 7.1 g/dL (ref 6.0–8.5)

## 2019-07-13 LAB — VITAMIN D 25 HYDROXY (VIT D DEFICIENCY, FRACTURES): Vit D, 25-Hydroxy: 47.4 ng/mL (ref 30.0–100.0)

## 2019-07-13 NOTE — Progress Notes (Signed)
Please let patient know all labs returned within normal ranges and look great.  Continue current medication regimen, including daily Vitamin D.  Great job!!

## 2019-07-28 ENCOUNTER — Ambulatory Visit: Payer: Medicare HMO | Attending: Internal Medicine

## 2019-07-28 DIAGNOSIS — Z23 Encounter for immunization: Secondary | ICD-10-CM | POA: Insufficient documentation

## 2019-07-28 NOTE — Progress Notes (Signed)
   Covid-19 Vaccination Clinic  Name:  Jessica Brooks    MRN: [id number] DOB: 01-22-1951  07/28/2019  [name] was observed post Covid-19 immunization for 15 minutes without incidence. She was provided with Vaccine Information Sheet and instruction to access the V-Safe system.   [name] was instructed to call 911 with any severe reactions post vaccine: Marland Kitchen Difficulty breathing  . Swelling of your face and throat  . A fast heartbeat  . A bad rash all over your body  . Dizziness and weakness    Immunizations Administered    Name Date Dose VIS Date Route   Pfizer COVID-19 Vaccine 07/28/2019 10:25 AM 0.3 mL 05/27/2019 Intramuscular   Manufacturer: [employer], [employer]   Lot: [id number]   NDC: [address]-2

## 2019-08-05 ENCOUNTER — Encounter: Payer: Self-pay | Admitting: Nurse Practitioner

## 2019-08-05 ENCOUNTER — Telehealth: Payer: Self-pay | Admitting: Nurse Practitioner

## 2019-08-05 NOTE — Telephone Encounter (Signed)
.  practice    08/05/2019 Name: RANDALL RAMPERSAD MRN: [id number] DOB: 1951/05/26  Called pt regarding [employer] Referral for . Patient is a 69 y.o. female  and is under the care of Marjie Skiff, NP LMTCB [employer] . Embedded Care Coordination [facility]  Care Management ??[email or web address]  [phone]

## 2019-08-05 NOTE — Telephone Encounter (Signed)
@  TD    @ Name: Jessica Brooks    MRN: [id number]    DOB: Oct 27, 1950    AGE: 69 y.o.    GENDER: female    PCP Marjie Skiff, NP.   Called pt regarding [employer] Referral for transportation. Pt stated that she had used [employer] in the past and was disappointed with the wait times. We discussed options and she agreed that she would reach out to that organization regarding assistance and I will mail her the information for their ride assist program along with my information if she should need it when setting up trips. Encouraged her to give feedback to scheduler letting them know if her past issues. Will mail [employer] info and my contact information.   Manuela Schwartz  Care Guide  Embedded Care Coordination [facility] Management [email or web address]   [phone]

## 2019-08-19 ENCOUNTER — Encounter: Payer: Self-pay | Admitting: Nurse Practitioner

## 2019-08-20 ENCOUNTER — Other Ambulatory Visit: Payer: Self-pay

## 2019-08-20 ENCOUNTER — Ambulatory Visit: Payer: Medicare HMO | Attending: Internal Medicine

## 2019-08-20 DIAGNOSIS — Z23 Encounter for immunization: Secondary | ICD-10-CM | POA: Insufficient documentation

## 2019-08-20 NOTE — Progress Notes (Signed)
   Covid-19 Vaccination Clinic  Name:  Jessica Brooks    MRN: [id number] DOB: Apr 03, 1951  08/20/2019  [name] was observed post Covid-19 immunization for 15 minutes without incident. She was provided with Vaccine Information Sheet and instruction to access the V-Safe system.   [name] was instructed to call 911 with any severe reactions post vaccine: Marland Kitchen Difficulty breathing  . Swelling of face and throat  . A fast heartbeat  . A bad rash all over body  . Dizziness and weakness   Immunizations Administered    Name Date Dose VIS Date Route   Pfizer COVID-19 Vaccine 08/20/2019  8:40 AM 0.3 mL 05/27/2019 Intramuscular   Manufacturer: [employer], [employer]   Lot: [id number]   NDC: [address]-8

## 2019-08-25 ENCOUNTER — Other Ambulatory Visit: Payer: Self-pay | Admitting: Nurse Practitioner

## 2019-09-15 ENCOUNTER — Other Ambulatory Visit: Payer: Self-pay

## 2019-09-15 ENCOUNTER — Ambulatory Visit (INDEPENDENT_AMBULATORY_CARE_PROVIDER_SITE_OTHER): Payer: Medicare HMO | Admitting: Nurse Practitioner

## 2019-09-15 ENCOUNTER — Encounter: Payer: Self-pay | Admitting: Nurse Practitioner

## 2019-09-15 VITALS — BP 140/90 | HR 98 | Temp 98.8°F | Ht 65.0 in | Wt 257.5 lb

## 2019-09-15 DIAGNOSIS — M25572 Pain in left ankle and joints of left foot: Secondary | ICD-10-CM | POA: Insufficient documentation

## 2019-09-15 NOTE — Patient Instructions (Addendum)
Let us know early next week if your ankle pain is not improving! Take care, - Lachandra Dettmann  Ankle Pain The ankle joint holds your body weight and allows you to move around. Ankle pain can occur on either side or the back of one ankle or both ankles. Ankle pain may be sharp and burning or dull and aching. There may be tenderness, stiffness, redness, or warmth around the ankle. Many things can cause ankle pain, including an injury to the area and overuse of the ankle. Follow these instructions at home: Activity  Rest your ankle as told by your health care provider. Avoid any activities that cause ankle pain.  Do not use the injured limb to support your body weight until your health care provider says that you can. Use crutches as told by your health care provider.  Do exercises as told by your health care provider.  Ask your health care provider when it is safe to drive if you have a brace on your ankle. If you have a brace:  Wear the brace as told by your health care provider. Remove it only as told by your health care provider.  Loosen the brace if your toes tingle, become numb, or turn cold and blue.  Keep the brace clean.  If the brace is not waterproof: ? Do not let it get wet. ? Cover it with a watertight covering when you take a bath or shower. If you were given an elastic bandage:   Remove it when you take a bath or a shower.  Try not to move your ankle very much, but wiggle your toes from time to time. This helps to prevent swelling.  Adjust the bandage to make it more comfortable if it feels too tight.  Loosen the bandage if you have numbness or tingling in your foot or if your foot turns cold and blue. Managing pain, stiffness, and swelling   If directed, put ice on the painful area. ? If you have a removable brace or elastic bandage, remove it as told by your health care provider. ? Put ice in a plastic bag. ? Place a towel between your skin and the bag. ? Leave the ice  on for 20 minutes, 2-3 times a day.  Move your toes often to avoid stiffness and to lessen swelling.  Raise (elevate) your ankle above the level of your heart while you are sitting or lying down. General instructions  Record information about your pain. Writing down the following may be helpful for you and your health care provider: ? How often you have ankle pain. ? Where the pain is located. ? What the pain feels like.  If treatment involves wearing a prescribed shoe or insole, make sure you wear it correctly and for as long as told by your health care provider.  Take over-the-counter and prescription medicines only as told by your health care provider.  Keep all follow-up visits as told by your health care provider. This is important. Contact a health care provider if:  Your pain gets worse.  Your pain is not relieved with medicines.  You have a fever or chills.  You are having more trouble with walking.  You have new symptoms. Get help right away if:  Your foot, leg, toes, or ankle: ? Tingles or becomes numb. ? Becomes swollen. ? Turns pale or blue. Summary  Ankle pain can occur on either side or the back of one ankle or both ankles.  Ankle pain may be  sharp and burning or dull and aching.  Rest your ankle as told by your health care provider. If told, apply ice to the area.  Take over-the-counter and prescription medicines only as told by your health care provider. This information is not intended to replace advice given to you by your health care provider. Make sure you discuss any questions you have with your health care provider. Document Revised: 09/21/2018 Document Reviewed: 12/09/2017 Elsevier Patient Education  2020 [employer].

## 2019-09-15 NOTE — Assessment & Plan Note (Signed)
No acute injury, with intermittent sharp pain and mild swelling.  Unknown etiology; ?gout vs. Arthritic component.  Uric acid checked today.  Already on Mobic, cannot give NSAIDs.  Wrapped with Ace-wrap today in office and advised to keep wrapped, rest, ice, and elevate, and okay to take Tylenol.  Ankle x-ray ordered today.  If not better by next week, call or return to office for further evaluation.

## 2019-09-15 NOTE — Progress Notes (Signed)
BP 140/90 (BP Location: Left Arm, Patient Position: Sitting, Cuff Size: Normal)   Pulse 98   Temp 98.8 F (37.1 C) (Oral)   Ht 5\' 5"  (1.651 m)   Wt 257 lb 8 oz (116.8 kg)   SpO2 98%   BMI 42.85 kg/m    Subjective:    Patient ID: Jessica Brooks, female    DOB: January 13, 1951, 69 y.o.   MRN: [id number]  HPI: Jessica Brooks is a 69 y.o. female presenting for foot and ankle pain  Chief Complaint  Patient presents with  . Claudication    L foot and ankle for 3 days. Pt states no injury was done to the foot or ankle   FOOT PAIN States that last night and this morning her foot was in a lot of pain.  This morning was in a stinging pain.   Duration: days Involved foot: left Mechanism of injury: unknown Location: whole foot and bottom Onset: sudden  Severity: severe  Quality:  sharp Frequency: few times per day  Duration: seconds Radiation: yes; up leg Aggravating factors: weight bearing, walking and movement  Alleviating factors: rubbing with alcohol, warm water and APAP  Status: worse and fluctuating Treatments attempted: warm water and rubbing alcohol and APAP  Relief with NSAIDs?:  No NSAIDs Taken Weakness with weight bearing or walking: no Morning stiffness: no Swelling: yes Redness: no Bruising: no Paresthesias / decreased sensation: yes  Fevers:no  Allergies  Allergen Reactions  . Hydrochlorothiazide Palpitations    Jittery   Outpatient Encounter Medications as of 09/15/2019  Medication Sig  . albuterol (VENTOLIN HFA) 108 (90 Base) MCG/ACT inhaler Inhale 2 puffs into the lungs every 6 (six) hours as needed for wheezing or shortness of breath.  Marland Kitchen amLODipine (NORVASC) 10 MG tablet Take 1 tablet (10 mg total) by mouth daily.  . Cholecalciferol (VITAMIN D PO) Take 1 capsule by mouth daily.   Marland Kitchen levothyroxine (SYNTHROID) 75 MCG tablet Take 1 tablet by mouth daily.  Marland Kitchen lisinopril (ZESTRIL) 5 MG tablet Take 1 tablet (5 mg total) by mouth daily.  . meloxicam  (MOBIC) 15 MG tablet TAKE 1 TABLET EVERY DAY  . montelukast (SINGULAIR) 10 MG tablet Take 1 tablet (10 mg total) by mouth at bedtime.  . mupirocin ointment (BACTROBAN) 2 % Apply 1 application topically 2 (two) times daily.   No facility-administered encounter medications on file as of 09/15/2019.   Patient Active Problem List   Diagnosis Date Noted  . Acute left ankle pain 09/15/2019  . Osteopenia of neck of right femur 07/12/2019  . Advanced directives, counseling/discussion 04/02/2018  . Low TSH level 10/19/2017  . Allergic rhinitis 12/08/2016  . Knee osteoarthritis 10/01/2016  . Hypertension 12/14/2015  . Keloid 11/20/2015  . Tibial tendonitis, posterior 11/20/2015  . Asthma 11/20/2015  . Morbid obesity with BMI of 40.0-44.9, adult ([place]) 11/20/2015   Past Medical History:  Diagnosis Date  . Arthritis   . Asthma    uses inhaler  . Cataract   . Hypertension   . Keloid    Relevant past medical, surgical, family and social history reviewed and updated as indicated. Interim medical history since our last visit reviewed.  Review of Systems  Constitutional: Negative.  Negative for activity change, appetite change, chills, fatigue and fever.  Musculoskeletal: Positive for arthralgias and joint swelling. Negative for back pain, gait problem, myalgias and neck pain.  Skin: Negative.  Negative for color change.  Neurological: Positive for numbness. Negative for dizziness, weakness, light-headedness  and headaches.  Psychiatric/Behavioral: Negative.  Negative for confusion and sleep disturbance. The patient is not nervous/anxious.     Per HPI unless specifically indicated above     Objective:    BP 140/90 (BP Location: Left Arm, Patient Position: Sitting, Cuff Size: Normal)   Pulse 98   Temp 98.8 F (37.1 C) (Oral)   Ht 5\' 5"  (1.651 m)   Wt 257 lb 8 oz (116.8 kg)   SpO2 98%   BMI 42.85 kg/m   Wt Readings from Last 3 Encounters:  09/15/19 257 lb 8 oz (116.8 kg)  07/12/19 262  lb 3.2 oz (118.9 kg)  04/29/19 257 lb (116.6 kg)    Physical Exam Vitals and nursing note reviewed.  Constitutional:      General: She is not in acute distress.    Appearance: Normal appearance. She is not toxic-appearing.  Musculoskeletal:        General: Normal range of motion.     Right lower leg: Normal. No swelling. No edema.     Left lower leg: Normal. No swelling. No edema.     Right ankle: Normal. No swelling or ecchymosis. Normal range of motion. Normal pulse.     Left ankle: Swelling present. Normal range of motion. Normal pulse.     Right foot: Normal. Normal range of motion. No swelling, foot drop, tenderness or bony tenderness. Normal pulse.     Left foot: Normal range of motion. Swelling (dorsal side of foot) present. No foot drop, tenderness or bony tenderness. Normal pulse.  Neurological:     Mental Status: She is alert.       Assessment & Plan:   Problem List Items Addressed This Visit      Other   Acute left ankle pain - Primary    No acute injury, with intermittent sharp pain and mild swelling.  Unknown etiology; ?gout vs. Arthritic component.  Uric acid checked today.  Already on Mobic, cannot give NSAIDs.  Wrapped with Ace-wrap today in office and advised to keep wrapped, rest, ice, and elevate, and okay to take Tylenol.  Ankle x-ray ordered today.  If not better by next week, call or return to office for further evaluation.      Relevant Orders   Uric acid   DG Ankle Complete Left       Follow up plan: Return if symptoms worsen or fail to improve.

## 2019-09-16 LAB — URIC ACID: Uric Acid: 3.3 mg/dL (ref 3.0–7.2)

## 2019-09-16 NOTE — Progress Notes (Signed)
Please let Jessica Brooks know that the gout level that we checked yesterday came back normal, so the cause of her pain is likely not a gout problem.  Please encourage her to get the x-rays as soon as possible and let us know early next week if not feeling better.

## 2019-09-19 ENCOUNTER — Telehealth: Payer: Self-pay | Admitting: Nurse Practitioner

## 2019-09-19 ENCOUNTER — Ambulatory Visit
Admission: RE | Admit: 2019-09-19 | Discharge: 2019-09-19 | Disposition: A | Discharge status: Home / Self Care | Payer: Medicare HMO | Attending: Nurse Practitioner | Admitting: Nurse Practitioner

## 2019-09-19 ENCOUNTER — Ambulatory Visit
Admission: RE | Admit: 2019-09-19 | Discharge: 2019-09-19 | Disposition: A | Discharge status: Home / Self Care | Payer: Medicare HMO | Source: Ambulatory Visit | Attending: Nurse Practitioner | Admitting: Nurse Practitioner

## 2019-09-19 DIAGNOSIS — M79672 Pain in left foot: Secondary | ICD-10-CM | POA: Diagnosis not present

## 2019-09-19 DIAGNOSIS — M25572 Pain in left ankle and joints of left foot: Secondary | ICD-10-CM | POA: Diagnosis not present

## 2019-09-19 DIAGNOSIS — M7989 Other specified soft tissue disorders: Secondary | ICD-10-CM | POA: Diagnosis not present

## 2019-09-19 NOTE — Telephone Encounter (Signed)
I called and informed Jessica Brooks of her x-ray results of her left ankle and foot.  She stated the pain is much improved with the rest, ice, elevation, and ace wrap.  She is agreeable to see a Podiatrist for the spurs and ongoing pain.  Referral placed today.

## 2019-09-27 ENCOUNTER — Encounter: Payer: Self-pay | Admitting: Podiatry

## 2019-09-27 ENCOUNTER — Other Ambulatory Visit: Payer: Self-pay

## 2019-09-27 ENCOUNTER — Ambulatory Visit (INDEPENDENT_AMBULATORY_CARE_PROVIDER_SITE_OTHER): Payer: Medicare HMO | Admitting: Podiatry

## 2019-09-27 VITALS — Temp 97.8°F

## 2019-09-27 DIAGNOSIS — M722 Plantar fascial fibromatosis: Secondary | ICD-10-CM | POA: Diagnosis not present

## 2019-09-27 MED ORDER — METHYLPREDNISOLONE 4 MG PO TBPK: ORAL_TABLET | ORAL | 0 refills | Status: DC

## 2019-10-03 NOTE — Progress Notes (Signed)
   Subjective: 69 y.o. female presenting today as a new patient with a chief complaint of burning, tingling and numbness of the left heel and arch that began 1-2 weeks ago. She states the pain is worse in the morning when she first gets out of bed and when she stands after being seated for a while. She has been taking Meloxicam and stretching the foot with some relief. Patient is here for further evaluation and treatment.   Past Medical History:  Diagnosis Date  . Arthritis   . Asthma    uses inhaler  . Cataract   . Hypertension   . Keloid      Objective: Physical Exam General: The patient is alert and oriented x3 in no acute distress.  Dermatology: Skin is warm, dry and supple bilateral lower extremities. Negative for open lesions or macerations bilateral.   Vascular: Dorsalis Pedis and Posterior Tibial pulses palpable bilateral.  Capillary fill time is immediate to all digits.  Neurological: Epicritic and protective threshold intact bilateral.   Musculoskeletal: Tenderness to palpation to the plantar aspect of the left heel along the plantar fascia. All other joints range of motion within normal limits bilateral. Strength 5/5 in all groups bilateral.   Assessment: 1. Plantar fasciitis left foot  Plan of Care:  1. Patient evaluated. Xrays from Epic reviewed.   2. Declined injections.  3. Prescription for Medrol Dose Pak provided to patient. Then resume taking Meloxicam.  4. Continue wearing good shoe gear.  5. Return to clinic as needed.    Felecia Shelling, DPM [facility]  Dr. Felecia Shelling, DPM    [address] [place], [place] [address]                Office [phone]  Fax [phone]

## 2019-11-09 ENCOUNTER — Other Ambulatory Visit: Payer: Self-pay | Admitting: Nurse Practitioner

## 2019-11-11 DIAGNOSIS — E890 Postprocedural hypothyroidism: Secondary | ICD-10-CM | POA: Diagnosis not present

## 2019-11-18 DIAGNOSIS — E890 Postprocedural hypothyroidism: Secondary | ICD-10-CM | POA: Diagnosis not present

## 2019-11-23 ENCOUNTER — Other Ambulatory Visit: Payer: Self-pay

## 2019-11-23 MED ORDER — MELOXICAM 15 MG PO TABS: 15.0000 mg | ORAL_TABLET | Freq: Every day | ORAL | 0 refills | Status: DC

## 2019-11-23 NOTE — Telephone Encounter (Signed)
Last regular visit was 07/12/19 and has appointment 01/23/20

## 2019-11-24 ENCOUNTER — Other Ambulatory Visit: Payer: Self-pay | Admitting: Nurse Practitioner

## 2019-11-24 MED ORDER — MUPIROCIN 2 % EX OINT: 1.0000 "application " | TOPICAL_OINTMENT | Freq: Two times a day (BID) | CUTANEOUS | 0 refills | Status: DC

## 2019-11-24 NOTE — Telephone Encounter (Signed)
Routing to provider  

## 2019-11-24 NOTE — Telephone Encounter (Signed)
Medication Refill - Medication: mupirocin ointment   Has the patient contacted their pharmacy? Yes.   (Agent: If no, request that the patient contact the pharmacy for the refill.) (Agent: If yes, when and what did the pharmacy advise?)  Preferred Pharmacy (with phone number or street name):  [facility] - [place], [place] - [address]  [address] [place] [address]  Phone: [phone] Fax: [phone]  Hours: Not open 24 hours     Agent: Please be advised that RX refills may take up to 3 business days. We ask that you follow-up with your pharmacy.

## 2019-12-01 ENCOUNTER — Telehealth: Payer: Self-pay

## 2019-12-01 ENCOUNTER — Other Ambulatory Visit: Payer: Self-pay | Admitting: Nurse Practitioner

## 2019-12-01 MED ORDER — MELOXICAM 15 MG PO TABS: 15.0000 mg | ORAL_TABLET | Freq: Every day | ORAL | 2 refills | Status: DC

## 2019-12-01 NOTE — Telephone Encounter (Signed)
Can a 90 day supply be sent into [facility] for the Meloxicam

## 2019-12-01 NOTE — Telephone Encounter (Signed)
Sent this in

## 2019-12-06 ENCOUNTER — Other Ambulatory Visit: Payer: Self-pay

## 2019-12-06 MED ORDER — MUPIROCIN 2 % EX OINT: 1.0000 "application " | TOPICAL_OINTMENT | Freq: Two times a day (BID) | CUTANEOUS | 0 refills | Status: DC

## 2019-12-06 NOTE — Telephone Encounter (Signed)
Refill for future, mail order request

## 2020-01-09 ENCOUNTER — Ambulatory Visit: Payer: Medicare HMO | Admitting: Nurse Practitioner

## 2020-01-10 ENCOUNTER — Other Ambulatory Visit: Payer: Self-pay | Admitting: Nurse Practitioner

## 2020-01-10 NOTE — Telephone Encounter (Signed)
Requested medications are due for refill today?  Yes  Requested medications are on active medication list?  Yes  Last Refill:   12/06/2019  # 22 g with no refills   Future visit scheduled?  Yes in one week.    Notes to Clinic:  This medication is not assigned to a protocol.

## 2020-01-11 NOTE — Telephone Encounter (Signed)
Routing to provider  

## 2020-01-23 ENCOUNTER — Other Ambulatory Visit: Payer: Self-pay

## 2020-01-23 ENCOUNTER — Ambulatory Visit: Payer: Medicare HMO | Admitting: Nurse Practitioner

## 2020-01-23 ENCOUNTER — Encounter: Payer: Self-pay | Admitting: Nurse Practitioner

## 2020-01-23 ENCOUNTER — Ambulatory Visit (INDEPENDENT_AMBULATORY_CARE_PROVIDER_SITE_OTHER): Payer: Medicare HMO | Admitting: Nurse Practitioner

## 2020-01-23 VITALS — BP 113/76 | HR 89 | Temp 98.1°F | Wt 262.0 lb

## 2020-01-23 DIAGNOSIS — I10 Essential (primary) hypertension: Secondary | ICD-10-CM | POA: Diagnosis not present

## 2020-01-23 DIAGNOSIS — J4521 Mild intermittent asthma with (acute) exacerbation: Secondary | ICD-10-CM

## 2020-01-23 DIAGNOSIS — J301 Allergic rhinitis due to pollen: Secondary | ICD-10-CM | POA: Diagnosis not present

## 2020-01-23 DIAGNOSIS — Z6841 Body Mass Index (BMI) 40.0 and over, adult: Secondary | ICD-10-CM

## 2020-01-23 DIAGNOSIS — R7989 Other specified abnormal findings of blood chemistry: Secondary | ICD-10-CM | POA: Diagnosis not present

## 2020-01-23 MED ORDER — AMLODIPINE BESYLATE 10 MG PO TABS: 10.0000 mg | ORAL_TABLET | Freq: Every day | ORAL | 4 refills | Status: DC

## 2020-01-23 MED ORDER — LISINOPRIL 5 MG PO TABS: 5.0000 mg | ORAL_TABLET | Freq: Every day | ORAL | 4 refills | Status: DC

## 2020-01-23 MED ORDER — ALBUTEROL SULFATE HFA 108 (90 BASE) MCG/ACT IN AERS: 2.0000 | INHALATION_SPRAY | Freq: Four times a day (QID) | RESPIRATORY_TRACT | 4 refills | Status: AC | PRN

## 2020-01-23 MED ORDER — MONTELUKAST SODIUM 10 MG PO TABS: 10.0000 mg | ORAL_TABLET | Freq: Every day | ORAL | 4 refills | Status: DC

## 2020-01-23 NOTE — Assessment & Plan Note (Signed)
Chronic, stable.  BP at goal on home readings and in office.  Continue Lisinopril and Amlodipine and adjust as needed, consider change to Losartan at next visit due to her underlying asthma which is well-controlled at this time.  Obtain labs next visit.  Continue to monitor BP at home a few days a week and document.  Refills sent in.  Return in 6 months.

## 2020-01-23 NOTE — Assessment & Plan Note (Signed)
With underlying HTN and Asthma.  Recommended eating smaller high protein, low fat meals more frequently and exercising 30 mins a day 5 times a week with a goal of 10-15lb weight loss in the next 3 months. Patient voiced their understanding and motivation to adhere to these recommendations.

## 2020-01-23 NOTE — Assessment & Plan Note (Signed)
Chronic, stable with minimal use of Albuterol.  Continue current medication regimen and adjust as needed.  Consider spirometry next visit.  Return in 6 months for physical.

## 2020-01-23 NOTE — Assessment & Plan Note (Signed)
Followed by Dr. Solum.  Continue this collaboration, recent note reviewed with patient. 

## 2020-01-23 NOTE — Progress Notes (Signed)
BP 113/76   Pulse 89   Temp 98.1 F (36.7 C) (Oral)   Wt 262 lb (118.8 kg)   SpO2 97%   BMI 43.60 kg/m    Subjective:    Patient ID: Jessica Brooks, female    DOB: December 02, 1950, 69 y.o.   MRN: [id number]  HPI: Jessica Brooks is a 69 y.o. female  Chief Complaint  Patient presents with  . Allergies  . Asthma  . Hypertension   HYPERTENSION Continues on Amlodipine and Lisinopril.  Recent labs LDL <100. Hypertension status: stable  Satisfied with current treatment? yes Duration of hypertension: chronic BP monitoring frequency:  daily BP range: 120/80's range at home BP medication side effects:  no Medication compliance: good compliance Aspirin: no Recurrent headaches: no Visual changes: no Palpitations: no Dyspnea: no Chest pain: no Lower extremity edema: no Dizzy/lightheaded: no  The 10-year ASCVD risk score Denman George DC Jr., et al., 2013) is: 8.3%   Values used to calculate the score:     Age: 68 years     Sex: Female     Is Non-Hispanic African American: Yes     Diabetic: No     Tobacco smoker: No     Systolic Blood Pressure: 113 mmHg     Is BP treated: Yes     HDL Cholesterol: 59 mg/dL     Total Cholesterol: 171 mg/dL  HYPOTHYRODISM Sees Dr. Tedd Sias for toxic multinodular goiter s/p radioiodine ablation.  She continues on Levothyroxine 75 MCG and last saw endo 11/18/19.  Thyroid control status:stable Satisfied with current treatment? yes Medication side effects: no Medication compliance: good compliance Etiology of hypothyroidism:  Recent dose adjustment:yes increased to 75 MCG Fatigue: no Cold intolerance: no Heat intolerance: no Weight gain: no Weight loss: no Constipation: no Diarrhea/loose stools: no Palpitations: no Lower extremity edema: no Anxiety/depressed mood: no   ALLERGIES Well-controlled on Singulair Duration: chronic Runny nose: no none Nasal congestion: no Nasal itching: no Sneezing: no Eye swelling, itching or  discharge: no Post nasal drip: no Cough: no Sinus pressure: none  Ear pain: none Ear pressure: none Fever: none Symptoms occur seasonally: yes Symptoms occur perenially: no Satisfied with current treatment: yes  ASTHMA Asthma status: stable Satisfied with current treatment?: yes Albuterol/rescue inhaler frequency: once every few months Dyspnea frequency: none Wheezing frequency: none Cough frequency: none Nocturnal symptom frequency: none Limitation of activity: no Current upper respiratory symptoms: no Triggers: seasonal allergies Home peak flows: none Last Spirometry: unknown Failed/intolerant to following asthma meds:  Asthma meds in past: albuterol Aerochamber/spacer use: no Visits to ER or Urgent Care in past year: no Pneumovax: Up to Date Influenza: Up to Date   Relevant past medical, surgical, family and social history reviewed and updated as indicated. Interim medical history since our last visit reviewed. Allergies and medications reviewed and updated.  Review of Systems  Constitutional: Negative for activity change, appetite change, diaphoresis, fatigue and fever.  Respiratory: Negative for cough, chest tightness and shortness of breath.   Cardiovascular: Negative for chest pain, palpitations and leg swelling.  Gastrointestinal: Negative.   Neurological: Negative.   Psychiatric/Behavioral: Negative.     Per HPI unless specifically indicated above     Objective:    BP 113/76   Pulse 89   Temp 98.1 F (36.7 C) (Oral)   Wt 262 lb (118.8 kg)   SpO2 97%   BMI 43.60 kg/m   Wt Readings from Last 3 Encounters:  01/23/20 262 lb (118.8 kg)  09/15/19  257 lb 8 oz (116.8 kg)  07/12/19 262 lb 3.2 oz (118.9 kg)    Physical Exam Vitals and nursing note reviewed.  Constitutional:      General: She is awake. She is not in acute distress.    Appearance: She is well-developed and well-groomed. She is morbidly obese. She is not ill-appearing.  HENT:      Head: Normocephalic.     Right Ear: Hearing normal.     Left Ear: Hearing normal.  Eyes:     General: Lids are normal.        Right eye: No discharge.        Left eye: No discharge.     Conjunctiva/sclera: Conjunctivae normal.     Pupils: Pupils are equal, round, and reactive to light.  Neck:     Vascular: No carotid bruit.  Cardiovascular:     Rate and Rhythm: Normal rate and regular rhythm.     Heart sounds: Normal heart sounds. No murmur heard.  No gallop.   Pulmonary:     Effort: Pulmonary effort is normal. No accessory muscle usage or respiratory distress.     Breath sounds: Normal breath sounds.  Abdominal:     General: Bowel sounds are normal.     Palpations: Abdomen is soft.  Musculoskeletal:     Cervical back: Normal range of motion and neck supple.     Right lower leg: No edema.     Left lower leg: No edema.  Skin:    General: Skin is warm and dry.  Neurological:     Mental Status: She is alert and oriented to person, place, and time.  Psychiatric:        Attention and Perception: Attention normal.        Mood and Affect: Mood normal.        Speech: Speech normal.        Behavior: Behavior normal. Behavior is cooperative.        Thought Content: Thought content normal.     Results for orders placed or performed in visit on 09/15/19  Uric acid  Result Value Ref Range   Uric Acid 3.3 3.0 - 7.2 mg/dL      Assessment & Plan:   Problem List Items Addressed This Visit      Cardiovascular and Mediastinum   Hypertension    Chronic, stable.  BP at goal on home readings and in office.  Continue Lisinopril and Amlodipine and adjust as needed, consider change to Losartan at next visit due to her underlying asthma which is well-controlled at this time.  Obtain labs next visit.  Continue to monitor BP at home a few days a week and document.  Refills sent in.  Return in 6 months.      Relevant Medications   amLODipine (NORVASC) 10 MG tablet   lisinopril (ZESTRIL) 5  MG tablet     Respiratory   Asthma    Chronic, stable with minimal use of Albuterol.  Continue current medication regimen and adjust as needed.  Consider spirometry next visit.  Return in 6 months for physical.      Relevant Medications   montelukast (SINGULAIR) 10 MG tablet   albuterol (VENTOLIN HFA) 108 (90 Base) MCG/ACT inhaler   Allergic rhinitis    Chronic, stable.  Continue current medication regimen and adjust as needed.  Refills on Singulair.        Other   Morbid obesity with BMI of 40.0-44.9, adult ([facility]) - Primary  With underlying HTN and Asthma.  Recommended eating smaller high protein, low fat meals more frequently and exercising 30 mins a day 5 times a week with a goal of 10-15lb weight loss in the next 3 months. Patient voiced their understanding and motivation to adhere to these recommendations.       Low TSH level    Followed by Dr. Tedd Sias.  Continue this collaboration, recent note reviewed with patient.          Follow up plan: Return in about 6 months (around 07/25/2020) for Annual physical.

## 2020-01-23 NOTE — Assessment & Plan Note (Signed)
Chronic, stable.  Continue current medication regimen and adjust as needed.  Refills on Singulair.

## 2020-01-23 NOTE — Patient Instructions (Signed)
DASH Eating Plan DASH stands for "Dietary Approaches to Stop Hypertension." The DASH eating plan is a healthy eating plan that has been shown to reduce high blood pressure (hypertension). It may also reduce your risk for type 2 diabetes, heart disease, and stroke. The DASH eating plan may also help with weight loss. What are tips for following this plan?  General guidelines  Avoid eating more than 2,300 mg (milligrams) of salt (sodium) a day. If you have hypertension, you may need to reduce your sodium intake to 1,500 mg a day.  Limit alcohol intake to no more than 1 drink a day for nonpregnant women and 2 drinks a day for men. One drink equals 12 oz of beer, 5 oz of wine, or 1 oz of hard liquor.  Work with your health care provider to maintain a healthy body weight or to lose weight. Ask what an ideal weight is for you.  Get at least 30 minutes of exercise that causes your heart to beat faster (aerobic exercise) most days of the week. Activities may include walking, swimming, or biking.  Work with your health care provider or diet and nutrition specialist (dietitian) to adjust your eating plan to your individual calorie needs. Reading food labels   Check food labels for the amount of sodium per serving. Choose foods with less than 5 percent of the Daily Value of sodium. Generally, foods with less than 300 mg of sodium per serving fit into this eating plan.  To find whole grains, look for the word "whole" as the first word in the ingredient list. Shopping  Buy products labeled as "low-sodium" or "no salt added."  Buy fresh foods. Avoid canned foods and premade or frozen meals. Cooking  Avoid adding salt when cooking. Use salt-free seasonings or herbs instead of table salt or sea salt. Check with your health care provider or pharmacist before using salt substitutes.  Do not fry foods. Cook foods using healthy methods such as baking, boiling, grilling, and broiling instead.  Cook with  heart-healthy oils, such as olive, canola, soybean, or sunflower oil. Meal planning  Eat a balanced diet that includes: ? 5 or more servings of fruits and vegetables each day. At each meal, try to fill half of your plate with fruits and vegetables. ? Up to 6-8 servings of whole grains each day. ? Less than 6 oz of lean meat, poultry, or fish each day. A 3-oz serving of meat is about the same size as a deck of cards. One egg equals 1 oz. ? 2 servings of low-fat dairy each day. ? A serving of nuts, seeds, or beans 5 times each week. ? Heart-healthy fats. Healthy fats called Omega-3 fatty acids are found in foods such as flaxseeds and coldwater fish, like sardines, salmon, and mackerel.  Limit how much you eat of the following: ? Canned or prepackaged foods. ? Food that is high in trans fat, such as fried foods. ? Food that is high in saturated fat, such as fatty meat. ? Sweets, desserts, sugary drinks, and other foods with added sugar. ? Full-fat dairy products.  Do not salt foods before eating.  Try to eat at least 2 vegetarian meals each week.  Eat more home-cooked food and less restaurant, buffet, and fast food.  When eating at a restaurant, ask that your food be prepared with less salt or no salt, if possible. What foods are recommended? The items listed may not be a complete list. Talk with your dietitian about   what dietary choices are best for you. Grains Whole-grain or whole-wheat bread. Whole-grain or whole-wheat pasta. Brown rice. Oatmeal. Quinoa. Bulgur. Whole-grain and low-sodium cereals. Pita bread. Low-fat, low-sodium crackers. Whole-wheat flour tortillas. Vegetables Fresh or frozen vegetables (raw, steamed, roasted, or grilled). Low-sodium or reduced-sodium tomato and vegetable juice. Low-sodium or reduced-sodium tomato sauce and tomato paste. Low-sodium or reduced-sodium canned vegetables. Fruits All fresh, dried, or frozen fruit. Canned fruit in natural juice (without  added sugar). Meat and other protein foods Skinless chicken or turkey. Ground chicken or turkey. Pork with fat trimmed off. Fish and seafood. Egg whites. Dried beans, peas, or lentils. Unsalted nuts, nut butters, and seeds. Unsalted canned beans. Lean cuts of beef with fat trimmed off. Low-sodium, lean deli meat. Dairy Low-fat (1%) or fat-free (skim) milk. Fat-free, low-fat, or reduced-fat cheeses. Nonfat, low-sodium ricotta or cottage cheese. Low-fat or nonfat yogurt. Low-fat, low-sodium cheese. Fats and oils Soft margarine without trans fats. Vegetable oil. Low-fat, reduced-fat, or light mayonnaise and salad dressings (reduced-sodium). Canola, safflower, olive, soybean, and sunflower oils. Avocado. Seasoning and other foods Herbs. Spices. Seasoning mixes without salt. Unsalted popcorn and pretzels. Fat-free sweets. What foods are not recommended? The items listed may not be a complete list. Talk with your dietitian about what dietary choices are best for you. Grains Baked goods made with fat, such as croissants, muffins, or some breads. Dry pasta or rice meal packs. Vegetables Creamed or fried vegetables. Vegetables in a cheese sauce. Regular canned vegetables (not low-sodium or reduced-sodium). Regular canned tomato sauce and paste (not low-sodium or reduced-sodium). Regular tomato and vegetable juice (not low-sodium or reduced-sodium). Pickles. Olives. Fruits Canned fruit in a light or heavy syrup. Fried fruit. Fruit in cream or butter sauce. Meat and other protein foods Fatty cuts of meat. Ribs. Fried meat. Bacon. Sausage. Bologna and other processed lunch meats. Salami. Fatback. Hotdogs. Bratwurst. Salted nuts and seeds. Canned beans with added salt. Canned or smoked fish. Whole eggs or egg yolks. Chicken or turkey with skin. Dairy Whole or 2% milk, cream, and half-and-half. Whole or full-fat cream cheese. Whole-fat or sweetened yogurt. Full-fat cheese. Nondairy creamers. Whipped toppings.  Processed cheese and cheese spreads. Fats and oils Butter. Stick margarine. Lard. Shortening. Ghee. Bacon fat. Tropical oils, such as coconut, palm kernel, or palm oil. Seasoning and other foods Salted popcorn and pretzels. Onion salt, garlic salt, seasoned salt, table salt, and sea salt. Worcestershire sauce. Tartar sauce. Barbecue sauce. Teriyaki sauce. Soy sauce, including reduced-sodium. Steak sauce. Canned and packaged gravies. Fish sauce. Oyster sauce. Cocktail sauce. Horseradish that you find on the shelf. Ketchup. Mustard. Meat flavorings and tenderizers. Bouillon cubes. Hot sauce and Tabasco sauce. Premade or packaged marinades. Premade or packaged taco seasonings. Relishes. Regular salad dressings. Where to find more information:  National Heart, Lung, and Blood Institute: www.nhlbi.nih.gov  American Heart Association: www.heart.org Summary  The DASH eating plan is a healthy eating plan that has been shown to reduce high blood pressure (hypertension). It may also reduce your risk for type 2 diabetes, heart disease, and stroke.  With the DASH eating plan, you should limit salt (sodium) intake to 2,300 mg a day. If you have hypertension, you may need to reduce your sodium intake to 1,500 mg a day.  When on the DASH eating plan, aim to eat more fresh fruits and vegetables, whole grains, lean proteins, low-fat dairy, and heart-healthy fats.  Work with your health care provider or diet and nutrition specialist (dietitian) to adjust your eating plan to your   individual calorie needs. This information is not intended to replace advice given to you by your health care provider. Make sure you discuss any questions you have with your health care provider. Document Revised: 05/15/2017 Document Reviewed: 05/26/2016 Elsevier Patient Education  2020 Elsevier Inc.  

## 2020-02-03 ENCOUNTER — Other Ambulatory Visit: Payer: Self-pay | Admitting: Family Medicine

## 2020-02-03 NOTE — Telephone Encounter (Signed)
Requested medication (s) are due for refill today:  Yes  Requested medication (s) are on the active medication list:  Yes  Future visit scheduled:  Yes  Last Refill: 01/11/20; 22 gm./ no refills  Notes to clinic:  Medication is off protocol.  Please advise.   Requested Prescriptions  Pending Prescriptions Disp Refills   mupirocin ointment (BACTROBAN) 2 % [Pharmacy Med Name: MUPIROCIN 2 % Ointment] 22 g 0    Sig: APPLY 1 APPLICATION TOPICALLY 2 (TWO) TIMES DAILY.      Off-Protocol Failed - 02/03/2020  4:55 PM      Failed - Medication not assigned to a protocol, review manually.      Passed - Valid encounter within last 12 months    Recent Outpatient Visits           1 week ago Morbid obesity with BMI of 40.0-44.9, adult [facility])   [facility] Neal, [place] T, NP   4 months ago Acute left ankle pain   [facility] Valentino Nose, NP   6 months ago Annual physical exam   [facility] [place], Corrie Dandy T, NP   7 months ago Hematuria, unspecified type   [facility] Gabriel Cirri, NP   8 months ago Essential hypertension   [facility] [place], Dorie Rank, NP       Future Appointments             In 6 months Cannady, Dorie Rank, NP [facility]

## 2020-02-06 ENCOUNTER — Ambulatory Visit: Payer: Medicare HMO | Admitting: Nurse Practitioner

## 2020-02-27 ENCOUNTER — Other Ambulatory Visit: Payer: Self-pay | Admitting: Family Medicine

## 2020-02-27 NOTE — Telephone Encounter (Signed)
Requested medications are due for refill today?  Yes  Requested medications are on active medication list?  Yes  Last Refill:   02/06/2020  # 22 g with no refills  Future visit scheduled?  Yes in 5 months.    Notes to Clinic:  Medication not assigned to a protocol.

## 2020-02-28 NOTE — Telephone Encounter (Signed)
Routing to provider  

## 2020-03-13 ENCOUNTER — Ambulatory Visit (INDEPENDENT_AMBULATORY_CARE_PROVIDER_SITE_OTHER): Payer: Medicare HMO

## 2020-03-13 ENCOUNTER — Other Ambulatory Visit: Payer: Self-pay

## 2020-03-13 DIAGNOSIS — Z23 Encounter for immunization: Secondary | ICD-10-CM | POA: Diagnosis not present

## 2020-03-21 ENCOUNTER — Other Ambulatory Visit: Payer: Self-pay | Admitting: Nurse Practitioner

## 2020-03-21 NOTE — Telephone Encounter (Signed)
Requested medication (s) are due for refill today: yes  Requested medication (s) are on the active medication list: yes  Last refill:  02/28/20 #22g 0 refills  Future visit scheduled: yes   Notes to clinic:  no protocol, do you want to renew Rx?     Requested Prescriptions  Pending Prescriptions Disp Refills   mupirocin ointment (BACTROBAN) 2 % [Pharmacy Med Name: MUPIROCIN 2 % Ointment] 22 g 0    Sig: APPLY 1 APPLICATION TOPICALLY 2 (TWO) TIMES DAILY.      Off-Protocol Failed - 03/21/2020 10:33 PM      Failed - Medication not assigned to a protocol, review manually.      Passed - Valid encounter within last 12 months    Recent Outpatient Visits           1 month ago Morbid obesity with BMI of 40.0-44.9, adult [facility])   [facility] [place], [place] T, NP   6 months ago Acute left ankle pain   [facility] Valentino Nose, NP   8 months ago Annual physical exam   [facility] [place], Corrie Dandy T, NP   8 months ago Hematuria, unspecified type   [facility] Gabriel Cirri, NP   9 months ago Essential hypertension   [facility] [place], Dorie Rank, NP       Future Appointments             In 4 months Cannady, Dorie Rank, NP [facility]

## 2020-03-23 ENCOUNTER — Other Ambulatory Visit: Payer: Self-pay | Admitting: Nurse Practitioner

## 2020-03-23 DIAGNOSIS — Z1231 Encounter for screening mammogram for malignant neoplasm of breast: Secondary | ICD-10-CM

## 2020-04-05 ENCOUNTER — Telehealth: Payer: Self-pay | Admitting: Nurse Practitioner

## 2020-04-05 NOTE — Telephone Encounter (Signed)
Copied from CRM [id number]. Topic: Medicare AWV >> Apr 05, 2020  2:15 PM Claudette Laws R wrote: Reason for CRM:  No answer unable to leave message for patient to call back and schedule the Medicare Annual Wellness Visit (AWV) virtually.  Last AWV 04/06/2019  Please schedule at anytime with [facility]Nurse Health Advisor.  45 minute appointment  Any questions, please call me at [phone]

## 2020-04-13 ENCOUNTER — Other Ambulatory Visit: Payer: Self-pay | Admitting: Nurse Practitioner

## 2020-04-13 NOTE — Telephone Encounter (Signed)
Requested medications are due for refill today yes  Requested medications are on the active medication list yes  Last refill 10/9  Notes to clinic No protocol, unsure if to be continued.

## 2020-04-16 NOTE — Telephone Encounter (Signed)
Routing to provider to advise. Patient last seen 01/23/20 and has appointment 08/07/20

## 2020-04-23 ENCOUNTER — Emergency Department
Admission: EM | Admit: 2020-04-23 | Discharge: 2020-04-23 | Disposition: A | Discharge status: Home / Self Care | Payer: Medicare HMO | Attending: Student in an Organized Health Care Education/Training Program | Admitting: Student in an Organized Health Care Education/Training Program

## 2020-04-23 ENCOUNTER — Other Ambulatory Visit: Payer: Self-pay

## 2020-04-23 ENCOUNTER — Emergency Department: Payer: Medicare HMO

## 2020-04-23 ENCOUNTER — Ambulatory Visit: Payer: Self-pay | Admitting: *Deleted

## 2020-04-23 DIAGNOSIS — Z87891 Personal history of nicotine dependence: Secondary | ICD-10-CM | POA: Insufficient documentation

## 2020-04-23 DIAGNOSIS — J45909 Unspecified asthma, uncomplicated: Secondary | ICD-10-CM | POA: Diagnosis not present

## 2020-04-23 DIAGNOSIS — Z79899 Other long term (current) drug therapy: Secondary | ICD-10-CM | POA: Insufficient documentation

## 2020-04-23 DIAGNOSIS — R002 Palpitations: Secondary | ICD-10-CM

## 2020-04-23 DIAGNOSIS — J90 Pleural effusion, not elsewhere classified: Secondary | ICD-10-CM | POA: Diagnosis not present

## 2020-04-23 DIAGNOSIS — R030 Elevated blood-pressure reading, without diagnosis of hypertension: Secondary | ICD-10-CM | POA: Diagnosis present

## 2020-04-23 DIAGNOSIS — I10 Essential (primary) hypertension: Secondary | ICD-10-CM

## 2020-04-23 LAB — BASIC METABOLIC PANEL
Anion gap: 8 (ref 5–15)
BUN: 14 mg/dL (ref 8–23)
CO2: 25 mmol/L (ref 22–32)
Calcium: 9 mg/dL (ref 8.9–10.3)
Chloride: 103 mmol/L (ref 98–111)
Creatinine, Ser: 0.72 mg/dL (ref 0.44–1.00)
GFR, Estimated: >60 mL/min (ref >60)
Glucose, Bld: 98 mg/dL (ref 70–99)
Potassium: 4.2 mmol/L (ref 3.5–5.1)
Sodium: 136 mmol/L (ref 135–145)

## 2020-04-23 LAB — URINALYSIS, COMPLETE (UACMP) WITH MICROSCOPIC
Bilirubin Urine: NEGATIVE
Glucose, UA: NEGATIVE mg/dL
Hgb urine dipstick: NEGATIVE
Ketones, ur: NEGATIVE mg/dL
Leukocytes,Ua: NEGATIVE
Nitrite: NEGATIVE
Protein, ur: NEGATIVE mg/dL
Specific Gravity, Urine: 1.015 (ref 1.005–1.030)
pH: 5 (ref 5.0–8.0)

## 2020-04-23 LAB — CBC
HCT: 41.8 % (ref 36.0–46.0)
Hemoglobin: 13.4 g/dL (ref 12.0–15.0)
MCH: 27.1 pg (ref 26.0–34.0)
MCHC: 32.1 g/dL (ref 30.0–36.0)
MCV: 84.4 fL (ref 80.0–100.0)
Platelets: 249 10*3/uL (ref 150–400)
RBC: 4.95 MIL/uL (ref 3.87–5.11)
RDW: 14.9 % (ref 11.5–15.5)
WBC: 4.2 10*3/uL (ref 4.0–10.5)
nRBC: 0 % (ref 0.0–0.2)

## 2020-04-23 LAB — TROPONIN I (HIGH SENSITIVITY): Troponin I (High Sensitivity): <2 ng/L (ref <18)

## 2020-04-23 NOTE — ED Notes (Signed)
E-signature not working at this time. Pt verbalized understanding of D/C instructions, prescriptions and follow up care with no further questions at this time. Pt in NAD and ambulatory at time of D/C.  

## 2020-04-23 NOTE — Telephone Encounter (Signed)
Noted and agree with plan due to symptoms -- would like follow-up after ER.

## 2020-04-23 NOTE — ED Notes (Signed)
Urinalysis sent to lab at this time.  

## 2020-04-23 NOTE — Telephone Encounter (Signed)
Called pt advised of Jolene's message. Pt is at the ER now

## 2020-04-23 NOTE — ED Triage Notes (Signed)
PT to ED via POV c/o feeling like her heart is racing. States last night she felt her BP was high and she has a sharp headache to to L forehead. PT states she has also been urinating more than normal. No confusion, dizziness or changes in vision. Face symmetric, no arm drift. Skin warm and dry of normal color.

## 2020-04-23 NOTE — Telephone Encounter (Signed)
Patient c/o sharp pain last night at [facility]-she woke in the middle of the night with chest discomfort and states she  feels palpitations today. Her BP is elevated today- advised with symptoms- she need evaluation at ED.  Reason for Disposition . [1] Systolic BP  >= 160 OR Diastolic >= 100 AND [2] cardiac or neurologic symptoms (e.g., chest pain, difficulty breathing, unsteady gait, blurred vision)  Answer Assessment - Initial Assessment Questions 1. BLOOD PRESSURE: "What is the blood pressure?" "Did you take at least two measurements 5 minutes apart?"     168/97,100,  156/97  100 2. ONSET: "When did you take your blood pressure?"     10:10 3. HOW: "How did you obtain the blood pressure?" (e.g., visiting nurse, automatic home BP monitor)     automatic 4. HISTORY: "Do you have a history of high blood pressure?"     yes 5. MEDICATIONS: "Are you taking any medications for blood pressure?" "Have you missed any doses recently?"     Yes- no changes or missed dose 6. OTHER SYMPTOMS: "Do you have any symptoms?" (e.g., headache, chest pain, blurred vision, difficulty breathing, weakness)     palpatation 7. PREGNANCY: "Is there any chance you are pregnant?" "When was your last menstrual period?"     n/a  Protocols used: BLOOD PRESSURE - HIGH-A-AH

## 2020-04-23 NOTE — ED Provider Notes (Signed)
[facility] Emergency Department Provider Note    First MD Initiated Contact with Patient 04/23/20 1708     (approximate)  I have reviewed the triage vital signs and the nursing notes.   HISTORY  Chief Complaint Hypertension    HPI Jessica Brooks is a 69 y.o. female with the below listed past medical history presents to the ER due to concern for elevated blood pressure as well as some generalized malaise and mild headache started last night.  Is not the worst headache of her life.  Was not sudden in onset lasted only a few seconds.  Was left-sided.  States that she has had similar headaches in the past.  Denies any trauma.  She is on anticoagulation.  Felt she was also having some palpitations which concerned her and brought her to the ER.  Never felt like her heart rate was fast.  She is been compliant with her home medications.  States her home blood pressures were 150 systolic and her normal is 130.  She denies any chest pain or pressure.  No shortness of breath.    Past Medical History:  Diagnosis Date  . Arthritis   . Asthma    uses inhaler  . Cataract   . Hypertension   . Keloid    Family History  Problem Relation Age of Onset  . Diabetes Mother   . Hypertension Mother   . Cancer Father        throat  . Diabetes Sister   . Kidney disease Sister   . Diabetes Brother   . Kidney disease Brother    Past Surgical History:  Procedure Laterality Date  . ABDOMINAL HYSTERECTOMY    . BREAST BIOPSY Right    needle bx years ago unsure of date  . BREAST BIOPSY Right    another bx done by byrnette   . CATARACT EXTRACTION W/PHACO Left 06/11/2017   Procedure: CATARACT EXTRACTION PHACO AND INTRAOCULAR LENS PLACEMENT (IOC)-LEFT;  Surgeon: Lockie Mola, MD;  Location: [facility] ORS;  Service: Ophthalmology;  Laterality: Left;  LOT # [id number] H [place] :34.4 AP 15.3% CDE 5.29  . 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  MULTIPLE TOOTH EXTRACTIONS     Patient Active Problem List   Diagnosis Date Noted  . Acute left ankle pain 09/15/2019  . Osteopenia of neck of right femur 07/12/2019  . Advanced directives, counseling/discussion 04/02/2018  . Low TSH level 10/19/2017  . Allergic rhinitis 12/08/2016  . Knee osteoarthritis 10/01/2016  . Hypertension 12/14/2015  . Keloid 11/20/2015  . Tibial tendonitis, posterior 11/20/2015  . Asthma 11/20/2015  . Morbid obesity with BMI of 40.0-44.9, adult (HCC) 11/20/2015      Prior to Admission medications   Medication Sig Start Date End Date Taking? Authorizing Provider  albuterol (VENTOLIN HFA) 108 (90 Base) MCG/ACT inhaler Inhale 2 puffs into the lungs every 6 (six) hours as needed for wheezing or shortness of breath. 01/23/20   Cannady, Corrie Dandy T, NP  amLODipine (NORVASC) 10 MG tablet Take 1 tablet (10 mg total) by mouth daily. 01/23/20   Cannady, Corrie Dandy T, NP  Cholecalciferol (VITAMIN D PO) Take 1 capsule by mouth daily.     [provider]  levothyroxine (SYNTHROID) 75 MCG tablet Take 1 tablet by mouth daily. 06/27/19 06/26/20  [provider]  lisinopril (ZESTRIL) 5 MG tablet Take 1 tablet (5 mg total) by mouth daily. 01/23/20   Marjie Skiff, NP  meloxicam (MOBIC) 15 MG tablet Take 1 tablet (15 mg total) by mouth daily. 12/01/19   Cannady, Corrie Dandy T, NP  montelukast (SINGULAIR) 10 MG tablet Take 1 tablet (10 mg total) by mouth at bedtime. 01/23/20   Cannady, Corrie Dandy T, NP  mupirocin ointment (BACTROBAN) 2 % APPLY 1 APPLICATION TOPICALLY 2 (TWO) TIMES DAILY. 04/16/20   Marjie Skiff, NP    Allergies Hydrochlorothiazide    Social History Social History   Tobacco Use  . Smoking status: Former Smoker    Quit date: 06/16/1978    Years since quitting: 41.8  . Smokeless tobacco: Never Used  Vaping Use  . Vaping Use: Never used  Substance Use Topics  . Alcohol use: No  . Drug use: No    Review of Systems Patient denies headaches,  rhinorrhea, blurry vision, numbness, shortness of breath, chest pain, edema, cough, abdominal pain, nausea, vomiting, diarrhea, dysuria, fevers, rashes or hallucinations unless otherwise stated above in HPI. ____________________________________________   PHYSICAL EXAM:  VITAL SIGNS: Vitals:   04/23/20 1221 04/23/20 1440  BP: (!) 158/99 (!) 133/91  Pulse: 93 84  Resp: 18 17  Temp: 99.1 F (37.3 C) 99.1 F (37.3 C)  SpO2: 96% 96%    Constitutional: Alert and oriented.  Eyes: Conjunctivae are normal.  Head: Atraumatic. Nose: No congestion/rhinnorhea. Mouth/Throat: Mucous membranes are moist.   Neck: No stridor. Painless ROM.  Cardiovascular: Normal rate, regular rhythm. Grossly normal heart sounds.  Good peripheral circulation. Respiratory: Normal respiratory effort.  No retractions. Lungs CTAB. Gastrointestinal: Soft and nontender. No distention. No abdominal bruits. No CVA tenderness. Genitourinary:  Musculoskeletal: No lower extremity tenderness nor edema.  No joint effusions. Neurologic: CN- intact.  No facial droop, Normal FNF.  Sensation intact bilaterally. Normal speech and language. No gross focal neurologic deficits are appreciated. No gait instability. Skin:  Skin is warm, dry and intact. No rash noted. Psychiatric: Mood and affect are normal. Speech and behavior are normal.  ____________________________________________   LABS (all labs ordered are listed, but only abnormal results are displayed)  Results for orders placed or performed during the hospital encounter of 04/23/20 (from the past 24 hour(s))  Basic metabolic panel     Status: None   Collection Time: 04/23/20 12:25 PM  Result Value Ref Range   Sodium 136 135 - 145 mmol/L   Potassium 4.2 3.5 - 5.1 mmol/L   Chloride 103 98 - 111 mmol/L   CO2 25 22 - 32 mmol/L   Glucose, Bld 98 70 - 99 mg/dL   BUN 14 8 - 23 mg/dL   Creatinine, Ser 1.63 0.44 - 1.00 mg/dL   Calcium 9.0 8.9 - 84.5 mg/dL   GFR, Estimated  >36 >46 mL/min   Anion gap 8 5 - 15  CBC     Status: None   Collection Time: 04/23/20 12:25 PM  Result Value Ref Range   WBC 4.2 4.0 - 10.5 K/uL   RBC 4.95 3.87 - 5.11 MIL/uL   Hemoglobin 13.4 12.0 - 15.0 g/dL   HCT 80.3 36 - 46 %   MCV 84.4 80.0 - 100.0 fL   MCH 27.1 26.0 - 34.0 pg   MCHC 32.1 30.0 - 36.0 g/dL   RDW 21.2 24.8 - 25.0 %   Platelets 249 150 - 400 K/uL   nRBC 0.0 0.0 - 0.2 %  Troponin I (High Sensitivity)     Status: None   Collection Time: 04/23/20 12:25 PM  Result Value Ref Range   Troponin  I (High Sensitivity) <2 <18 ng/L   ____________________________________________  EKG My review and personal interpretation at Time: 12:18   Indication: htn  Rate: 85  Rhythm: sinus Axis: normal Other: normal intervals, no stemi ____________________________________________  RADIOLOGY  I personally reviewed all radiographic images ordered to evaluate for the above acute complaints and reviewed radiology reports and findings.  These findings were personally discussed with the patient.  Please see medical record for radiology report.  ____________________________________________   PROCEDURES  Procedure(s) performed:  Procedures    Critical Care performed: no ____________________________________________   INITIAL IMPRESSION / ASSESSMENT AND PLAN / ED COURSE  Pertinent labs & imaging results that were available during my care of the patient were reviewed by me and considered in my medical decision making (see chart for details).   DDX: Hypertension, tension headache, cluster, GCA, SAH, anxiety, dysrhythmia, ACS  Burnis S Alaniz is a 69 y.o. who presents to the ED with presentation as described above.  Patient is clinically very well-appearing she is asymptomatic.  She not have any signs of meningeal irritation.  No neuro deficits.  Is not the worst headache of her life.  Only brief in nature.  Not consistent with GCA.  Temporal arteries are nontender.  Globes are  soft.  Not consistent with glaucoma.  No visual deficits.  Suspect tension headache.  She is not having any discomfort right now therefore does not seem consistent with bleed.  Not excessive CVA.  Do not feel that CT imaging clinically indicated at this time.  Blood pressure only mildly elevated.  EKG with some occasional PAC.  No A. fib or preexcitation syndrome.  Troponin negative.  Blood work is reassuring.  States she is also having some dysuria over the past few nights but does not want wait for results.  Will send urine to lab.  Chest x-ray reviewed and the reported nodule corresponds with a electrode that was not removed prior to his chest x-ray.  Her exam and work-up is reassuring she is appropriate for outpatient follow-up.     The patient was evaluated in Emergency Department today for the symptoms described in the history of present illness. He/she was evaluated in the context of the global COVID-19 pandemic, which necessitated consideration that the patient might be at risk for infection with the SARS-CoV-2 virus that causes COVID-19. Institutional protocols and algorithms that pertain to the evaluation of patients at risk for COVID-19 are in a state of rapid change based on information released by regulatory bodies including the CDC and federal and state organizations. These policies and algorithms were followed during the patient's care in the ED.  As part of my medical decision making, I reviewed the following data within the electronic MEDICAL RECORD NUMBER Nursing notes reviewed and incorporated, Labs reviewed, notes from prior ED visits and [place] Controlled Substance Database   ____________________________________________   FINAL CLINICAL IMPRESSION(S) / ED DIAGNOSES  Final diagnoses:  Hypertension, unspecified type  Palpitations      NEW MEDICATIONS STARTED DURING THIS VISIT:  New Prescriptions   No medications on file     Note:  This document was prepared using Dragon voice  recognition software and may include unintentional dictation errors.    Willy Eddy, MD 04/23/20 1731

## 2020-05-02 ENCOUNTER — Ambulatory Visit
Admission: RE | Admit: 2020-05-02 | Discharge: 2020-05-02 | Disposition: A | Discharge status: Home / Self Care | Payer: Medicare HMO | Source: Ambulatory Visit | Attending: Nurse Practitioner | Admitting: Nurse Practitioner

## 2020-05-02 ENCOUNTER — Other Ambulatory Visit: Payer: Self-pay

## 2020-05-02 DIAGNOSIS — Z1231 Encounter for screening mammogram for malignant neoplasm of breast: Secondary | ICD-10-CM | POA: Insufficient documentation

## 2020-05-04 ENCOUNTER — Inpatient Hospital Stay: Payer: Medicare HMO | Admitting: Nurse Practitioner

## 2020-05-08 DIAGNOSIS — H524 Presbyopia: Secondary | ICD-10-CM | POA: Diagnosis not present

## 2020-05-08 DIAGNOSIS — H5203 Hypermetropia, bilateral: Secondary | ICD-10-CM | POA: Diagnosis not present

## 2020-05-24 DIAGNOSIS — E890 Postprocedural hypothyroidism: Secondary | ICD-10-CM | POA: Diagnosis not present

## 2020-05-30 ENCOUNTER — Inpatient Hospital Stay: Payer: Medicare HMO | Admitting: Nurse Practitioner

## 2020-06-01 ENCOUNTER — Other Ambulatory Visit: Payer: Self-pay | Admitting: Nurse Practitioner

## 2020-06-01 NOTE — Telephone Encounter (Signed)
Call to patient- she states she does not need the inhaler now and will call when she does.

## 2020-06-11 DIAGNOSIS — H353131 Nonexudative age-related macular degeneration, bilateral, early dry stage: Secondary | ICD-10-CM | POA: Diagnosis not present

## 2020-06-15 ENCOUNTER — Other Ambulatory Visit: Payer: Self-pay | Admitting: Nurse Practitioner

## 2020-06-18 NOTE — Telephone Encounter (Signed)
Requested medication (s) are due for refill today: yes  Requested medication (s) are on the active medication list: yes  Last refill:  04/16/2020  Future visit scheduled: yes  Notes to clinic:  Medication not assigned to a protocol, review manually.   Requested Prescriptions  Pending Prescriptions Disp Refills   mupirocin ointment (BACTROBAN) 2 % [Pharmacy Med Name: MUPIROCIN 2 % Ointment] 22 g 0    Sig: APPLY 1 APPLICATION TOPICALLY 2 (TWO) TIMES DAILY.      Off-Protocol Failed - 06/15/2020  4:38 PM      Failed - Medication not assigned to a protocol, review manually.      Passed - Valid encounter within last 12 months    Recent Outpatient Visits           4 months ago Morbid obesity with BMI of 40.0-44.9, adult [facility])   [facility] [place], Ashippun T, NP   9 months ago Acute left ankle pain   [facility] Valentino Nose, NP   11 months ago Annual physical exam   [facility] [place], Corrie Dandy T, NP   11 months ago Hematuria, unspecified type   [facility] Gabriel Cirri, NP   1 year ago Essential hypertension   [facility] [place], Dorie Rank, NP       Future Appointments             In 1 month [place], Dorie Rank, NP [facility], [facility]

## 2020-06-18 NOTE — Telephone Encounter (Signed)
Routing to provider  

## 2020-07-10 ENCOUNTER — Telehealth: Payer: Self-pay | Admitting: Nurse Practitioner

## 2020-07-10 NOTE — Telephone Encounter (Signed)
Copied from CRM [id number]. Topic: Medicare AWV >> Jul 10, 2020  1:46 PM Claudette Laws R wrote: Reason for CRM:   No answer unable to leave a message for patient to call back and schedule the Medicare Annual Wellness Visit (AWV) virtually or by telephone.  Last AWV 04/06/2019  Please schedule at anytime with [facility]-Nurse Health Advisor.  45 minute appointment  Any questions, please call me at [phone]

## 2020-07-12 ENCOUNTER — Other Ambulatory Visit: Payer: Self-pay | Admitting: Nurse Practitioner

## 2020-07-12 NOTE — Telephone Encounter (Signed)
Requested medication (s) are due for refill today: yes  Requested medication (s) are on the active medication list: yes  Last refill:  06/18/20  Future visit scheduled: yes  Notes to clinic:  Please review for refill. No protocol to delegate refill    Requested Prescriptions  Pending Prescriptions Disp Refills   mupirocin ointment (BACTROBAN) 2 % [Pharmacy Med Name: MUPIROCIN 2 % Ointment] 22 g 0    Sig: APPLY 1 APPLICATION TOPICALLY 2 (TWO) TIMES DAILY.      Off-Protocol Failed - 07/12/2020  1:58 PM      Failed - Medication not assigned to a protocol, review manually.      Passed - Valid encounter within last 12 months    Recent Outpatient Visits           5 months ago Morbid obesity with BMI of 40.0-44.9, adult [facility])   [facility] [place], Gully T, NP   10 months ago Acute left ankle pain   [facility] Valentino Nose, NP   1 year ago Annual physical exam   [facility] Marjie Skiff, NP   1 year ago Hematuria, unspecified type   [facility] Gabriel Cirri, NP   1 year ago Essential hypertension   [facility] Marjie Skiff, NP       Future Appointments             In 1 week [place], Arlys John, MD [facility]   In 3 weeks [place], Dorie Rank, NP [facility]

## 2020-07-12 NOTE — Telephone Encounter (Signed)
Patient last seen 01/23/20 and has appointment 08/07/20

## 2020-07-16 DIAGNOSIS — E890 Postprocedural hypothyroidism: Secondary | ICD-10-CM | POA: Diagnosis not present

## 2020-07-20 DIAGNOSIS — H26492 Other secondary cataract, left eye: Secondary | ICD-10-CM | POA: Diagnosis not present

## 2020-07-24 ENCOUNTER — Ambulatory Visit (INDEPENDENT_AMBULATORY_CARE_PROVIDER_SITE_OTHER): Payer: Medicare HMO | Admitting: Cardiology

## 2020-07-24 ENCOUNTER — Other Ambulatory Visit: Payer: Self-pay

## 2020-07-24 ENCOUNTER — Encounter: Payer: Self-pay | Admitting: Cardiology

## 2020-07-24 VITALS — BP 130/68 | HR 97 | Ht 65.0 in | Wt 260.0 lb

## 2020-07-24 DIAGNOSIS — I10 Essential (primary) hypertension: Secondary | ICD-10-CM | POA: Diagnosis not present

## 2020-07-24 DIAGNOSIS — E039 Hypothyroidism, unspecified: Secondary | ICD-10-CM

## 2020-07-24 NOTE — Progress Notes (Signed)
Cardiology Office Note:    Date:  07/24/2020   ID:  Jessica Brooks, DOB [birth year], MRN [id number]  PCP:  Marjie Skiff, NP  [facility] Cardiologist:  No primary care provider on file.  [facility] Electrophysiologist:  None   Referring MD: Marjie Skiff, NP   Chief Complaint  Patient presents with  . Follow-up    ED follow-up for Hypertension    History of Present Illness:    Jessica Brooks is a 70 y.o. female with a hx of hypertension who presents due to elevated blood pressures.  Patient seen in the ED 2 months ago on 04/2020 due to hypertension and headaches.  She states being stressed at home already, was concerned about Covid.  Checked her blood pressures which were elevated up to 190s systolic, associated with headaches.  Presented to the ED, blood pressures on presentation was in the 170s systolic.  She was watched in the ED, with improvement in blood pressures without any intervention/medication.  Work-up with EKG labs were unrevealing.  Blood pressure improved to the 130s and patient was discharged.  She denies having any further episodes, blood pressures at home are typically in the 120s to 130s.  She takes all her medications as prescribed.  Denies chest pain, shortness of breath, palpitations.  Past Medical History:  Diagnosis Date  . Arthritis   . Asthma    uses inhaler  . Cataract   . Hypertension   . Keloid     Past Surgical History:  Procedure Laterality Date  . ABDOMINAL HYSTERECTOMY    . BREAST BIOPSY Right    needle bx years ago unsure of date  . BREAST BIOPSY Right    another bx done by [facility]   . CATARACT EXTRACTION W/PHACO Left 06/11/2017   Procedure: CATARACT EXTRACTION PHACO AND INTRAOCULAR LENS PLACEMENT (IOC)-LEFT;  Surgeon: Lockie Mola, MD;  Location: [facility] ORS;  Service: Ophthalmology;  Laterality: Left;  LOT # [id number] H [place] :34.4 AP 15.3% CDE 5.29  . 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 MULTIPLE TOOTH EXTRACTIONS      Current Medications: Current Meds  Medication Sig  . albuterol (VENTOLIN HFA) 108 (90 Base) MCG/ACT inhaler Inhale 2 puffs into the lungs every 6 (six) hours as needed for wheezing or shortness of breath.  Marland Kitchen amLODipine (NORVASC) 10 MG tablet Take 1 tablet (10 mg total) by mouth daily.  . Cholecalciferol (VITAMIN D PO) Take 1 capsule by mouth daily.   Marland Kitchen lisinopril (ZESTRIL) 5 MG tablet Take 1 tablet (5 mg total) by mouth daily.  . meloxicam (MOBIC) 15 MG tablet Take 1 tablet (15 mg total) by mouth daily.  . montelukast (SINGULAIR) 10 MG tablet Take 1 tablet (10 mg total) by mouth at bedtime.  . mupirocin ointment (BACTROBAN) 2 % APPLY 1 APPLICATION TOPICALLY 2 (TWO) TIMES DAILY.     Allergies:   Hydrochlorothiazide   Social History   Socioeconomic History  . Marital status: Divorced    Spouse name: Not on file  . Number of children: Not on file  . Years of education: Not on file  . Highest education level: Not on file  Occupational History  . Not on file  Tobacco Use  . Smoking status: Former Smoker    Quit date: 06/16/1978    Years since quitting: 42.1  . Smokeless tobacco: Never Used  Vaping Use  . Vaping Use: Never used  Substance and Sexual Activity  .  Alcohol use: No  . Drug use: No  . Sexual activity: Never  Other Topics Concern  . Not on file  Social History Narrative  . Not on file   Social Determinants of Health   Financial Resource Strain: Not on file  Food Insecurity: Not on file  Transportation Needs: Not on file  Physical Activity: Not on file  Stress: Not on file  Social Connections: Not on file     Family History: The patient's family history includes Cancer in her father; Diabetes in her brother, mother, and sister; Hypertension in her mother; Kidney disease in her brother and sister.  ROS:   Please see the history of present illness.     All other systems reviewed and are negative.  EKGs/Labs/Other  Studies Reviewed:    The following studies were reviewed today:   EKG:  EKG not  ordered today.  ECG 04/23/2020 reviewed by myself.  Shows sinus rhythm, PACs, otherwise normal ECG.  Recent Labs: 04/23/2020: BUN 14; Creatinine, Ser 0.72; Hemoglobin 13.4; Platelets 249; Potassium 4.2; Sodium 136  Recent Lipid Panel    Component Value Date/Time   CHOL 171 07/12/2019 0847   CHOL 166 04/29/2019 1019   TRIG 78 07/12/2019 0847   TRIG 63 04/29/2019 1019   HDL 59 07/12/2019 0847   VLDL 13 04/29/2019 1019   LDLCALC 97 07/12/2019 0847     Risk Assessment/Calculations:      Physical Exam:    VS:  BP 130/68   Pulse 97   Ht 5\' 5"  (1.651 m)   Wt 260 lb (117.9 kg)   BMI 43.27 kg/m     Wt Readings from Last 3 Encounters:  07/24/20 260 lb (117.9 kg)  04/23/20 258 lb (117 kg)  01/23/20 262 lb (118.8 kg)     GEN:  Well nourished, well developed in no acute distress HEENT: Normal NECK: No JVD; No carotid bruits LYMPHATICS: No lymphadenopathy CARDIAC: RRR, no murmurs, rubs, gallops RESPIRATORY:  Clear to auscultation without rales, wheezing or rhonchi  ABDOMEN: Soft, non-tender, non-distended MUSCULOSKELETAL:  No edema; No deformity  SKIN: Warm and dry NEUROLOGIC:  Alert and oriented x 3 PSYCHIATRIC:  Normal affect   ASSESSMENT:    1. Primary hypertension   2. Hypothyroidism, unspecified type    PLAN:    In order of problems listed above:  1. Patient with history of hypertension, heart and acute elevation in blood pressure associated with stress.  Blood pressures have been controlled since, denies any cardiac symptoms.  Continue current BP medications of amlodipine and lisinopril as prescribed.  Low-salt diet recommended.  No additional testing from a cardiac perspective indicated at this time.  Keep follow-up with primary care provider. 2. Hypothyroidism, continue Synthroid as prescribed.  Management as per PCP.  Follow-up as needed.     Medication Adjustments/Labs and  Tests Ordered: Current medicines are reviewed at length with the patient today.  Concerns regarding medicines are outlined above.  No orders of the defined types were placed in this encounter.  No orders of the defined types were placed in this encounter.   Patient Instructions  Medication Instructions:  Your physician recommends that you continue on your current medications as directed. Please refer to the Current Medication list given to you today.  *If you need a refill on your cardiac medications before your next appointment, please call your pharmacy*   Lab Work: None ordered If you have labs (blood work) drawn today and your tests are completely normal, you  will receive your results only by: Marland Kitchen MyChart Message (if you have MyChart) OR . A paper copy in the mail If you have any lab test that is abnormal or we need to change your treatment, we will call you to review the results.   Testing/Procedures: None ordered   Follow-Up: At [facility], you and your health needs are our priority.  As part of our continuing mission to provide you with exceptional heart care, we have created designated Provider Care Teams.  These Care Teams include your primary Cardiologist (physician) and Advanced Practice Providers (APPs -  Physician Assistants and Nurse Practitioners) who all work together to provide you with the care you need, when you need it.  We recommend signing up for the patient portal called "MyChart".  Sign up information is provided on this After Visit Summary.  MyChart is used to connect with patients for Virtual Visits (Telemedicine).  Patients are able to view lab/test results, encounter notes, upcoming appointments, etc.  Non-urgent messages can be sent to your provider as well.   To learn more about what you can do with MyChart, go to [email or web address].    Your next appointment:   Follow up as needed   The format for your next appointment:   In Person  Provider:    Debbe Odea, MD   Other Instructions      Signed, Debbe Odea, MD  07/24/2020 9:50 AM    [facility]

## 2020-07-24 NOTE — Patient Instructions (Signed)

## 2020-08-07 ENCOUNTER — Other Ambulatory Visit: Payer: Self-pay

## 2020-08-07 ENCOUNTER — Encounter: Payer: Self-pay | Admitting: Nurse Practitioner

## 2020-08-07 ENCOUNTER — Ambulatory Visit (INDEPENDENT_AMBULATORY_CARE_PROVIDER_SITE_OTHER): Payer: Medicare HMO | Admitting: Nurse Practitioner

## 2020-08-07 VITALS — BP 131/84 | HR 97 | Temp 98.2°F | Ht 65.0 in | Wt 260.0 lb

## 2020-08-07 DIAGNOSIS — H6123 Impacted cerumen, bilateral: Secondary | ICD-10-CM | POA: Diagnosis not present

## 2020-08-07 DIAGNOSIS — I10 Essential (primary) hypertension: Secondary | ICD-10-CM

## 2020-08-07 DIAGNOSIS — M85851 Other specified disorders of bone density and structure, right thigh: Secondary | ICD-10-CM | POA: Diagnosis not present

## 2020-08-07 DIAGNOSIS — J301 Allergic rhinitis due to pollen: Secondary | ICD-10-CM

## 2020-08-07 DIAGNOSIS — R7989 Other specified abnormal findings of blood chemistry: Secondary | ICD-10-CM | POA: Diagnosis not present

## 2020-08-07 DIAGNOSIS — J4520 Mild intermittent asthma, uncomplicated: Secondary | ICD-10-CM

## 2020-08-07 DIAGNOSIS — Z0000 Encounter for general adult medical examination without abnormal findings: Secondary | ICD-10-CM

## 2020-08-07 DIAGNOSIS — Z6841 Body Mass Index (BMI) 40.0 and over, adult: Secondary | ICD-10-CM

## 2020-08-07 NOTE — Assessment & Plan Note (Signed)
Chronic, stable.  Continue current medication regimen and adjust as needed.  Refills on Singulair up to date.

## 2020-08-07 NOTE — Progress Notes (Signed)
BP 131/84   Pulse 97   Temp 98.2 F (36.8 C) (Oral)   Ht  (1.651 m)   Wt 260 lb (117.9 kg)   SpO2 99%   BMI 43.27 kg/m    Subjective:    Patient ID: Jessica Brooks, female    DOB: 24-Aug-1950, 70 y.o.   MRN: [id number]  HPI: Jessica Brooks is a 70 y.o. female presenting on 08/07/2020 for comprehensive medical examination. Current medical complaints include:none  She currently lives with: self Menopausal Symptoms: no   HYPERTENSION Continues on Amlodipine and Lisinopril.  Last cardiology visit on 07/24/20.   Hypertension status: stable  Satisfied with current treatment? yes Duration of hypertension: chronic BP monitoring frequency:  daily BP range: 120/70-80 range at home BP medication side effects:  no Medication compliance: good compliance Aspirin: no Recurrent headaches: no Visual changes: no Palpitations: no Dyspnea: no Chest pain: no Lower extremity edema: no Dizzy/lightheaded: no   HYPOTHYRODISM Sees Dr. Tedd Sias for toxic multinodular goiter s/p radioiodine ablation.  She saw her last 07/16/20.  She is taking Levothyroxine 100 MCG daily and this continues. Thyroid control status:stable Satisfied with current treatment? yes Medication side effects: no Medication compliance: good compliance Etiology of hypothyroidism:  Recent dose adjustment: none Fatigue: no Cold intolerance: no Heat intolerance: no Weight gain: no Weight loss: no Constipation: no Diarrhea/loose stools: no Palpitations: no Lower extremity edema: no Anxiety/depressed mood: no   OSTEOPENIA Last Dexa in 2017 with osteopenia noted.  Continues on daily Vitamin D and taking in as much calcium as possible.  No recent falls or fractures.   ALLERGIES Well controlled on Singulair Duration: chronic Runny nose: no none Nasal congestion: no Nasal itching: no Sneezing: no Eye swelling, itching or discharge: no Post nasal drip: no Cough: no Sinus pressure: none  Ear pain:  none Ear pressure: none Fever: none Symptoms occur seasonally: yes Symptoms occur perenially: no Satisfied with current treatment: yes  ASTHMA Asthma status: stable Satisfied with current treatment?: yes Albuterol/rescue inhaler frequency: once every few months Dyspnea frequency: none Wheezing frequency: none Cough frequency: none Nocturnal symptom frequency: none Limitation of activity: no Current upper respiratory symptoms: no Triggers: seasonal allergies Home peak flows: none Last Spirometry: unknown Failed/intolerant to following asthma meds:  Asthma meds in past: albuterol Aerochamber/spacer use: no Visits to ER or Urgent Care in past year: no Pneumovax: Up to Date Influenza: Up to Date  Depression Screen done today and results listed below:  Depression screen [facility] 2/9 08/07/2020 07/12/2019 04/06/2019 04/02/2018 04/02/2018  Decreased Interest 0 0 0 0 0  Down, Depressed, Hopeless 0 0 0 1 1  PHQ - 2 Score 0 0 0 1 1  Altered sleeping - - - 2 2  Tired, decreased energy - - - 1 1  Change in appetite - - - 0 0  Feeling bad or failure about yourself  - - - 1 1  Trouble concentrating - - - 0 0  Moving slowly or fidgety/restless - - - 0 0  Suicidal thoughts - - - 0 0  PHQ-9 Score - - - 5 5  Difficult doing work/chores - - - - -    The patient does not have a history of falls. I did not complete a risk assessment for falls. A plan of care for falls was not documented.   Past Medical History:  Past Medical History:  Diagnosis Date  . Arthritis   . Asthma    uses inhaler  . Cataract   .  Hypertension   . Keloid     Surgical History:  Past Surgical History:  Procedure Laterality Date  . ABDOMINAL HYSTERECTOMY    . BREAST BIOPSY Right    needle bx years ago unsure of date  . BREAST BIOPSY Right    another bx done by byrnette   . CATARACT EXTRACTION W/PHACO Left 06/11/2017   Procedure: CATARACT EXTRACTION PHACO AND INTRAOCULAR LENS PLACEMENT (IOC)-LEFT;  Surgeon:  Lockie Mola, MD;  Location: [facility] ORS;  Service: Ophthalmology;  Laterality: Left;  LOT # [id number] H [place] :34.4 AP 15.3% CDE 5.29  . 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 MULTIPLE TOOTH EXTRACTIONS      Medications:  Current Outpatient Medications on File Prior to Visit  Medication Sig  . albuterol (VENTOLIN HFA) 108 (90 Base) MCG/ACT inhaler Inhale 2 puffs into the lungs every 6 (six) hours as needed for wheezing or shortness of breath.  Marland Kitchen amLODipine (NORVASC) 10 MG tablet Take 1 tablet (10 mg total) by mouth daily.  . Cholecalciferol (VITAMIN D PO) Take 1 capsule by mouth daily.   Marland Kitchen levothyroxine (SYNTHROID) 100 MCG tablet Take 100 mcg by mouth daily before breakfast.  . lisinopril (ZESTRIL) 5 MG tablet Take 1 tablet (5 mg total) by mouth daily.  . meloxicam (MOBIC) 15 MG tablet Take 1 tablet (15 mg total) by mouth daily.  . montelukast (SINGULAIR) 10 MG tablet Take 1 tablet (10 mg total) by mouth at bedtime.  . mupirocin ointment (BACTROBAN) 2 % APPLY 1 APPLICATION TOPICALLY 2 (TWO) TIMES DAILY.   No current facility-administered medications on file prior to visit.    Allergies:  Allergies  Allergen Reactions  . Hydrochlorothiazide Palpitations    Jittery    Social History:  Social History   Socioeconomic History  . Marital status: Divorced    Spouse name: Not on file  . Number of children: Not on file  . Years of education: Not on file  . Highest education level: Not on file  Occupational History  . Not on file  Tobacco Use  . Smoking status: Former Smoker    Quit date: 06/16/1978    Years since quitting: 42.1  . Smokeless tobacco: Never Used  Vaping Use  . Vaping Use: Never used  Substance and Sexual Activity  . Alcohol use: No  . Drug use: No  . Sexual activity: Never  Other Topics Concern  . Not on file  Social History Narrative  . Not on file   Social Determinants of Health   Financial Resource Strain: Not on file  Food  Insecurity: Not on file  Transportation Needs: Not on file  Physical Activity: Not on file  Stress: Not on file  Social Connections: Not on file  Intimate Partner Violence: Not on file   Social History   Tobacco Use  Smoking Status Former Smoker  . Quit date: 06/16/1978  . Years since quitting: 42.1  Smokeless Tobacco Never Used   Social History   Substance and Sexual Activity  Alcohol Use No    Family History:  Family History  Problem Relation Age of Onset  . Diabetes Mother   . Hypertension Mother   . Cancer Father        throat  . Diabetes Sister   . Kidney disease Sister   . Diabetes Brother   . Kidney disease Brother     Past medical history, surgical history, medications, allergies, family history and social history reviewed  with patient today and changes made to appropriate areas of the chart.   Review of Systems - negative All other ROS negative except what is listed above and in the HPI.      Objective:    BP 131/84   Pulse 97   Temp 98.2 F (36.8 C) (Oral)   Ht 5\' 5"  (1.651 m)   Wt 260 lb (117.9 kg)   SpO2 99%   BMI 43.27 kg/m   Wt Readings from Last 3 Encounters:  08/07/20 260 lb (117.9 kg)  07/24/20 260 lb (117.9 kg)  04/23/20 258 lb (117 kg)    Physical Exam Vitals and nursing note reviewed.  Constitutional:      General: She is awake. She is not in acute distress.    Appearance: She is well-developed and well-groomed. She is morbidly obese. She is not ill-appearing.  HENT:     Head: Normocephalic and atraumatic.     Right Ear: Hearing, ear canal and external ear normal. No drainage. There is impacted cerumen.     Left Ear: Hearing, ear canal and external ear normal. No drainage. There is impacted cerumen.     Ears:     Comments: Cerumen impacted bilaterally, CMA to bedside to irrigate ears.    Nose: Nose normal.     Right Sinus: No maxillary sinus tenderness or frontal sinus tenderness.     Left Sinus: No maxillary sinus tenderness or  frontal sinus tenderness.     Mouth/Throat:     Mouth: Mucous membranes are moist.     Pharynx: Oropharynx is clear. Uvula midline. No pharyngeal swelling, oropharyngeal exudate or posterior oropharyngeal erythema.  Eyes:     General: Lids are normal.        Right eye: No discharge.        Left eye: No discharge.     Extraocular Movements: Extraocular movements intact.     Conjunctiva/sclera: Conjunctivae normal.     Pupils: Pupils are equal, round, and reactive to light.     Visual Fields: Right eye visual fields normal and left eye visual fields normal.  Neck:     Thyroid: No thyromegaly.     Vascular: No carotid bruit.     Trachea: Trachea normal.  Cardiovascular:     Rate and Rhythm: Normal rate and regular rhythm.     Heart sounds: Normal heart sounds. No murmur heard. No gallop.   Pulmonary:     Effort: Pulmonary effort is normal. No accessory muscle usage or respiratory distress.     Breath sounds: Normal breath sounds.  Chest:     Comments: Deferred per patient request Abdominal:     General: Bowel sounds are normal.     Palpations: Abdomen is soft. There is no hepatomegaly or splenomegaly.     Tenderness: There is no abdominal tenderness.  Musculoskeletal:        General: Normal range of motion.     Cervical back: Normal range of motion and neck supple.     Right lower leg: No edema.     Left lower leg: No edema.  Lymphadenopathy:     Head:     Right side of head: No submental, submandibular, tonsillar, preauricular or posterior auricular adenopathy.     Left side of head: No submental, submandibular, tonsillar, preauricular or posterior auricular adenopathy.     Cervical: No cervical adenopathy.  Skin:    General: Skin is warm and dry.     Capillary Refill: Capillary refill takes  less than 2 seconds.     Findings: No rash.  Neurological:     Mental Status: She is alert and oriented to person, place, and time.     Cranial Nerves: Cranial nerves are intact.      Gait: Gait is intact.     Deep Tendon Reflexes: Reflexes are normal and symmetric.     Reflex Scores:      Brachioradialis reflexes are 2+ on the right side and 2+ on the left side.      Patellar reflexes are 2+ on the right side and 2+ on the left side. Psychiatric:        Attention and Perception: Attention normal.        Mood and Affect: Mood normal.        Speech: Speech normal.        Behavior: Behavior normal. Behavior is cooperative.        Thought Content: Thought content normal.        Judgment: Judgment normal.     Results for orders placed or performed during the hospital encounter of 04/23/20  Basic metabolic panel  Result Value Ref Range   Sodium 136 135 - 145 mmol/L   Potassium 4.2 3.5 - 5.1 mmol/L   Chloride 103 98 - 111 mmol/L   CO2 25 22 - 32 mmol/L   Glucose, Bld 98 70 - 99 mg/dL   BUN 14 8 - 23 mg/dL   Creatinine, Ser 8.85 0.44 - 1.00 mg/dL   Calcium 9.0 8.9 - 02.7 mg/dL   GFR, Estimated >74 >12 mL/min   Anion gap 8 5 - 15  CBC  Result Value Ref Range   WBC 4.2 4.0 - 10.5 K/uL   RBC 4.95 3.87 - 5.11 MIL/uL   Hemoglobin 13.4 12.0 - 15.0 g/dL   HCT 87.8 67.6 - 72.0 %   MCV 84.4 80.0 - 100.0 fL   MCH 27.1 26.0 - 34.0 pg   MCHC 32.1 30.0 - 36.0 g/dL   RDW 94.7 09.6 - 28.3 %   Platelets 249 150 - 400 K/uL   nRBC 0.0 0.0 - 0.2 %  Urinalysis, Complete w Microscopic  Result Value Ref Range   Color, Urine YELLOW (A) YELLOW   APPearance CLEAR (A) CLEAR   Specific Gravity, Urine 1.015 1.005 - 1.030   pH 5.0 5.0 - 8.0   Glucose, UA NEGATIVE NEGATIVE mg/dL   Hgb urine dipstick NEGATIVE NEGATIVE   Bilirubin Urine NEGATIVE NEGATIVE   Ketones, ur NEGATIVE NEGATIVE mg/dL   Protein, ur NEGATIVE NEGATIVE mg/dL   Nitrite NEGATIVE NEGATIVE   Leukocytes,Ua NEGATIVE NEGATIVE   RBC / HPF 0-5 0 - 5 RBC/hpf   WBC, UA 0-5 0 - 5 WBC/hpf   Bacteria, UA RARE (A) NONE SEEN   Squamous Epithelial / LPF 0-5 0 - 5   Mucus PRESENT   Troponin I (High Sensitivity)  Result  Value Ref Range   Troponin I (High Sensitivity) <2 <18 ng/L      Assessment & Plan:   Problem List Items Addressed This Visit      Cardiovascular and Mediastinum   Hypertension    Chronic, stable.  BP at goal on home readings and in office.  Continue Lisinopril and Amlodipine and adjust as needed, consider change to Losartan at next visit due to her underlying asthma which is well-controlled at this time.  Obtain labs today: CMP, lipid, CBC.  Continue to monitor BP at home a few days a week  and document.  DASH diet focus.  Return in 6 months.      Relevant Orders   CBC with Differential/Platelet   Comprehensive metabolic panel   Lipid Panel w/o Chol/HDL Ratio     Respiratory   Asthma    Chronic, stable with minimal use of Albuterol.  Continue current medication regimen and adjust as needed.  Consider spirometry next visit.  Return in 6 months.      Allergic rhinitis    Chronic, stable.  Continue current medication regimen and adjust as needed.  Refills on Singulair up to date.        Musculoskeletal and Integument   Osteopenia of neck of right femur    Noted on DEXA 2017, repeat need October 2022.  Continue daily supplements, check Vit d level today.      Relevant Orders   VITAMIN D 25 Hydroxy (Vit-D Deficiency, Fractures)     Other   Morbid obesity with BMI of 40.0-44.9, adult (HCC) - Primary    BMI 43.27 with underlying HTN and Asthma.  Recommended eating smaller high protein, low fat meals more frequently and exercising 30 mins a day 5 times a week with a goal of 10-15lb weight loss in the next 3 months. Patient voiced their understanding and motivation to adhere to these recommendations.       Low TSH level    Followed by Dr. Tedd Sias.  Continue this collaboration, recent note reviewed with patient.       Other Visit Diagnoses    Bilateral impacted cerumen       Noted on exam, ears cleaned bilaterally via irrigation by CMA. No issue with procedure.   Annual physical  exam       Annual labs today to include CBC, CMP, Lipid.  Health maintenance reviewed with patient and everything up to date.       Follow up plan: Return in about 6 months (around 02/04/2021) for HTN, ASTHMA, OSTEOPENIA, THYROID -- need spirometry.   LABORATORY TESTING:  - Pap smear: not applicable  IMMUNIZATIONS:   - Tdap: Tetanus vaccination status reviewed: last tetanus booster within 10 years - Influenza: Up to date - Pneumovax: Up to date - Prevnar: Up to date - HPV: Not applicable - Zostavax vaccine: Up to date  - Covid -- received all three  SCREENING: -Mammogram: Up to date  - Colonoscopy: Up to date  - Bone Density: Up to date -- next due 2022, osteopenia in 2017 -Hearing Test: Not applicable  -Spirometry: Not applicable   PATIENT COUNSELING:   Advised to take 1 mg of folate supplement per day if capable of pregnancy.   Sexuality: Discussed sexually transmitted diseases, partner selection, use of condoms, avoidance of unintended pregnancy  and contraceptive alternatives.   Advised to avoid cigarette smoking.  I discussed with the patient that most people either abstain from alcohol or drink within safe limits (<=14/week and <=4 drinks/occasion for males, <=7/weeks and <= 3 drinks/occasion for females) and that the risk for alcohol disorders and other health effects rises proportionally with the number of drinks per week and how often a drinker exceeds daily limits.  Discussed cessation/primary prevention of drug use and availability of treatment for abuse.   Diet: Encouraged to adjust caloric intake to maintain  or achieve ideal body weight, to reduce intake of dietary saturated fat and total fat, to limit sodium intake by avoiding high sodium foods and not adding table salt, and to maintain adequate dietary potassium and calcium preferably  from fresh fruits, vegetables, and low-fat dairy products.    Stressed the importance of regular exercise  Injury prevention:  Discussed safety belts, safety helmets, smoke detector, smoking near bedding or upholstery.   Dental health: Discussed importance of regular tooth brushing, flossing, and dental visits.    NEXT PREVENTATIVE PHYSICAL DUE IN 1 YEAR. Return in about 6 months (around 02/04/2021) for HTN, ASTHMA, OSTEOPENIA, THYROID -- need spirometry.

## 2020-08-07 NOTE — Assessment & Plan Note (Addendum)
Chronic, stable with minimal use of Albuterol.  Continue current medication regimen and adjust as needed.  Consider spirometry next visit.  Return in 6 months.

## 2020-08-07 NOTE — Assessment & Plan Note (Signed)
Followed by Dr. Tedd Sias.  Continue this collaboration, recent note reviewed with patient.

## 2020-08-07 NOTE — Assessment & Plan Note (Signed)
Noted on DEXA 2017, repeat need October 2022.  Continue daily supplements, check Vit d level today.

## 2020-08-07 NOTE — Patient Instructions (Signed)

## 2020-08-07 NOTE — Assessment & Plan Note (Signed)
BMI 43.27 with underlying HTN and Asthma.  Recommended eating smaller high protein, low fat meals more frequently and exercising 30 mins a day 5 times a week with a goal of 10-15lb weight loss in the next 3 months. Patient voiced their understanding and motivation to adhere to these recommendations.

## 2020-08-07 NOTE — Assessment & Plan Note (Signed)
Chronic, stable.  BP at goal on home readings and in office.  Continue Lisinopril and Amlodipine and adjust as needed, consider change to Losartan at next visit due to her underlying asthma which is well-controlled at this time.  Obtain labs today: CMP, lipid, CBC.  Continue to monitor BP at home a few days a week and document.  DASH diet focus.  Return in 6 months.

## 2020-08-08 LAB — CBC WITH DIFFERENTIAL/PLATELET
Basophils Absolute: 0 10*3/uL (ref 0.0–0.2)
Basos: 1 %
EOS (ABSOLUTE): 0 10*3/uL (ref 0.0–0.4)
Eos: 1 %
Hematocrit: 40 % (ref 34.0–46.6)
Hemoglobin: 13 g/dL (ref 11.1–15.9)
Immature Grans (Abs): 0 10*3/uL (ref 0.0–0.1)
Immature Granulocytes: 0 %
Lymphocytes Absolute: 1.8 10*3/uL (ref 0.7–3.1)
Lymphs: 39 %
MCH: 27.3 pg (ref 26.6–33.0)
MCHC: 32.5 g/dL (ref 31.5–35.7)
MCV: 84 fL (ref 79–97)
Monocytes Absolute: 0.5 10*3/uL (ref 0.1–0.9)
Monocytes: 10 %
Neutrophils Absolute: 2.3 10*3/uL (ref 1.4–7.0)
Neutrophils: 49 %
Platelets: 268 10*3/uL (ref 150–450)
RBC: 4.76 x10E6/uL (ref 3.77–5.28)
RDW: 14.2 % (ref 11.7–15.4)
WBC: 4.7 10*3/uL (ref 3.4–10.8)

## 2020-08-08 LAB — COMPREHENSIVE METABOLIC PANEL
ALT: 13 IU/L (ref 0–32)
AST: 16 IU/L (ref 0–40)
Albumin/Globulin Ratio: 1.3 (ref 1.2–2.2)
Albumin: 4.3 g/dL (ref 3.7–4.7)
Alkaline Phosphatase: 67 IU/L (ref 44–121)
BUN/Creatinine Ratio: 19 (ref 12–28)
BUN: 13 mg/dL (ref 8–27)
Bilirubin Total: 0.2 mg/dL (ref 0.0–1.2)
CO2: 19 mmol/L — ABNORMAL LOW (ref 20–29)
Calcium: 9.7 mg/dL (ref 8.7–10.3)
Chloride: 100 mmol/L (ref 96–106)
Creatinine, Ser: 0.67 mg/dL (ref 0.57–1.00)
GFR calc Af Amer: 102 mL/min/{1.73_m2} (ref >59)
GFR calc non Af Amer: 89 mL/min/{1.73_m2} (ref >59)
Globulin, Total: 3.2 g/dL (ref 1.5–4.5)
Glucose: 101 mg/dL — ABNORMAL HIGH (ref 65–99)
Potassium: 4.4 mmol/L (ref 3.5–5.2)
Sodium: 139 mmol/L (ref 134–144)
Total Protein: 7.5 g/dL (ref 6.0–8.5)

## 2020-08-08 LAB — LIPID PANEL W/O CHOL/HDL RATIO
Cholesterol, Total: 184 mg/dL (ref 100–199)
HDL: 59 mg/dL (ref >39)
LDL Chol Calc (NIH): 111 mg/dL — ABNORMAL HIGH (ref 0–99)
Triglycerides: 73 mg/dL (ref 0–149)
VLDL Cholesterol Cal: 14 mg/dL (ref 5–40)

## 2020-08-08 LAB — VITAMIN D 25 HYDROXY (VIT D DEFICIENCY, FRACTURES): Vit D, 25-Hydroxy: 47 ng/mL (ref 30.0–100.0)

## 2020-08-08 NOTE — Progress Notes (Signed)
Good morning, please let Jessica Brooks know her labs have returned and overall are stable with exception of elevation in LDL, bad cholesterol, at 111.  Would like to see this at least in 90 range, <70 for stroke prevention.  I would recommend starting a low dose statin to help lower these levels.  We could start out low and adjust as needed.  Main side effects can be muscle aches and fatigue, if these presented I would want her to let me know.  Does she want to try this?  If so I will send in and would want to see her in 8 weeks for visit to assess tolerance and checks labs.  Let me know.  Any questions? Keep being awesome!!  Thank you for allowing me to participate in your care. Kindest regards, Easter Kennebrew

## 2020-08-09 ENCOUNTER — Other Ambulatory Visit: Payer: Self-pay | Admitting: Nurse Practitioner

## 2020-08-09 MED ORDER — ATORVASTATIN CALCIUM 10 MG PO TABS: 10.0000 mg | ORAL_TABLET | Freq: Every day | ORAL | 3 refills | Status: DC

## 2020-08-15 ENCOUNTER — Other Ambulatory Visit: Payer: Self-pay | Admitting: Nurse Practitioner

## 2020-08-15 NOTE — Telephone Encounter (Signed)
Requested medication (s) are due for refill today: yes  Requested medication (s) are on the active medication list: yes  Last refill:  07/15/2020  Future visit scheduled:yes  Notes to clinic:  Medication not assigned to a protocol, review manually   Requested Prescriptions  Pending Prescriptions Disp Refills   mupirocin ointment (BACTROBAN) 2 % [Pharmacy Med Name: MUPIROCIN 2 % Ointment] 22 g 0    Sig: APPLY 1 APPLICATION TOPICALLY 2 (TWO) TIMES DAILY.      Off-Protocol Failed - 08/15/2020  2:07 PM      Failed - Medication not assigned to a protocol, review manually.      Passed - Valid encounter within last 12 months    Recent Outpatient Visits           1 week ago Morbid obesity with BMI of 40.0-44.9, adult (HCC)   [facility] [place], Jolene T, NP   6 months ago Morbid obesity with BMI of 40.0-44.9, adult (HCC)   [facility] [place], Carnation T, NP   11 months ago Acute left ankle pain   [facility] Valentino Nose, NP   1 year ago Annual physical exam   [facility] [place], Corrie Dandy T, NP   1 year ago Hematuria, unspecified type   [facility] Gabriel Cirri, NP       Future Appointments             In 1 month Cannady, Dorie Rank, NP [facility]   In 5 months [place], Dorie Rank, NP [facility]

## 2020-08-21 ENCOUNTER — Other Ambulatory Visit: Payer: Self-pay | Admitting: Nurse Practitioner

## 2020-09-07 ENCOUNTER — Other Ambulatory Visit: Payer: Self-pay | Admitting: Nurse Practitioner

## 2020-09-07 NOTE — Telephone Encounter (Signed)
Requested medications are due for refill today yes  Requested medications are on the active medication list yes  Last refill 3/3  Last visit 07/2020  Future visit scheduled 09/2020  Notes to clinic Unsure if this is to be a continuous med or not, also no protocol, please review.

## 2020-09-17 ENCOUNTER — Other Ambulatory Visit: Payer: Self-pay

## 2020-09-17 MED ORDER — ATORVASTATIN CALCIUM 10 MG PO TABS
10.0000 mg | ORAL_TABLET | Freq: Every day | ORAL | 3 refills | Status: DC
Start: 2020-09-17 — End: 2020-10-16

## 2020-09-17 NOTE — Telephone Encounter (Signed)
[employer] sent over fax stating patient is requesting new rx for 90 day supply be sent over to [facility] pharmacy. Please advise

## 2020-10-01 ENCOUNTER — Other Ambulatory Visit: Payer: Self-pay | Admitting: Nurse Practitioner

## 2020-10-01 NOTE — Telephone Encounter (Signed)
Requested medication (s) are due for refill today:  yes   Requested medication (s) are on the active medication list: yes  Last refill:  09/10/2020  Future visit scheduled: yes  Notes to clinic:  Medication not assigned to a protocol, review manually   Requested Prescriptions  Pending Prescriptions Disp Refills   mupirocin ointment (BACTROBAN) 2 % [Pharmacy Med Name: MUPIROCIN 2 % Ointment] 22 g 0    Sig: APPLY TOPICALLY 2 (TWO) TIMES DAILY.      Off-Protocol Failed - 10/01/2020  1:46 PM      Failed - Medication not assigned to a protocol, review manually.      Passed - Valid encounter within last 12 months    Recent Outpatient Visits           1 month ago Morbid obesity with BMI of 40.0-44.9, adult (HCC)   [facility] Cannady, Jolene T, NP   8 months ago Morbid obesity with BMI of 40.0-44.9, adult (HCC)   [facility] [place], Corrie Dandy T, NP   1 year ago Acute left ankle pain   [facility] Valentino Nose, NP   1 year ago Annual physical exam   [facility] [place], Corrie Dandy T, NP   1 year ago Hematuria, unspecified type   [facility] Gabriel Cirri, NP       Future Appointments             In 3 days Cannady, Dorie Rank, NP [facility]   In 4 months [place], Dorie Rank, NP [facility]

## 2020-10-01 NOTE — Telephone Encounter (Signed)
Scheduled 4/21

## 2020-10-04 ENCOUNTER — Ambulatory Visit: Payer: Self-pay | Admitting: Nurse Practitioner

## 2020-10-16 ENCOUNTER — Ambulatory Visit (INDEPENDENT_AMBULATORY_CARE_PROVIDER_SITE_OTHER): Payer: Medicare HMO | Admitting: Nurse Practitioner

## 2020-10-16 ENCOUNTER — Other Ambulatory Visit: Payer: Self-pay

## 2020-10-16 ENCOUNTER — Encounter: Payer: Self-pay | Admitting: Nurse Practitioner

## 2020-10-16 VITALS — BP 112/74 | HR 98 | Temp 98.7°F | Wt 249.8 lb

## 2020-10-16 DIAGNOSIS — R7989 Other specified abnormal findings of blood chemistry: Secondary | ICD-10-CM | POA: Diagnosis not present

## 2020-10-16 DIAGNOSIS — R7301 Impaired fasting glucose: Secondary | ICD-10-CM | POA: Diagnosis not present

## 2020-10-16 DIAGNOSIS — R519 Headache, unspecified: Secondary | ICD-10-CM | POA: Insufficient documentation

## 2020-10-16 DIAGNOSIS — I10 Essential (primary) hypertension: Secondary | ICD-10-CM | POA: Diagnosis not present

## 2020-10-16 DIAGNOSIS — E782 Mixed hyperlipidemia: Secondary | ICD-10-CM

## 2020-10-16 DIAGNOSIS — M25512 Pain in left shoulder: Secondary | ICD-10-CM | POA: Insufficient documentation

## 2020-10-16 DIAGNOSIS — G44229 Chronic tension-type headache, not intractable: Secondary | ICD-10-CM | POA: Diagnosis not present

## 2020-10-16 DIAGNOSIS — J4520 Mild intermittent asthma, uncomplicated: Secondary | ICD-10-CM

## 2020-10-16 DIAGNOSIS — Z6841 Body Mass Index (BMI) 40.0 and over, adult: Secondary | ICD-10-CM

## 2020-10-16 MED ORDER — MONTELUKAST SODIUM 10 MG PO TABS
10.0000 mg | ORAL_TABLET | Freq: Every day | ORAL | 4 refills | Status: DC
Start: 2020-10-16 — End: 2021-01-21

## 2020-10-16 MED ORDER — LISINOPRIL 5 MG PO TABS
5.0000 mg | ORAL_TABLET | Freq: Every day | ORAL | 4 refills | Status: DC
Start: 2020-10-16 — End: 2021-01-21

## 2020-10-16 MED ORDER — AMLODIPINE BESYLATE 10 MG PO TABS
10.0000 mg | ORAL_TABLET | Freq: Every day | ORAL | 4 refills | Status: DC
Start: 2020-10-16 — End: 2021-01-21

## 2020-10-16 NOTE — Assessment & Plan Note (Signed)
Ongoing for a few weeks, concern for rotator cuff issue.  Will place referral to ortho for further assessment due to pain and decreased strength.  Recommend she start taking Tylenol as needed at home + alternate heat and ice.  May use OTC Voltaren gel as needed.  If worsening consider addition of Tizanidine.  Return to office for worsening and in 4 weeks for follow-up.

## 2020-10-16 NOTE — Assessment & Plan Note (Signed)
Followed by Dr. Tedd Sias.  Continue this collaboration, recent note reviewed with patient.
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
## 2020-10-16 NOTE — Assessment & Plan Note (Signed)
Noted on past labs, check A1c today. 

## 2020-10-16 NOTE — Assessment & Plan Note (Signed)
BMI 41.57 with underlying HTN and Asthma -- praised for losing 11 pounds.  Recommended eating smaller high protein, low fat meals more frequently and exercising 30 mins a day 5 times a week with a goal of 10-15lb weight loss in the next 3 months. Patient voiced their understanding and motivation to adhere to these recommendations.

## 2020-10-16 NOTE — Assessment & Plan Note (Signed)
Chronic, stable with minimal use of Albuterol.  Continue current medication regimen and adjust as needed.  Consider spirometry next visit.  Return in 6 months.

## 2020-10-16 NOTE — Patient Instructions (Signed)
High Cholesterol  High cholesterol is a condition in which the blood has high levels of a white, waxy substance similar to fat (cholesterol). The liver makes all the cholesterol that the body needs. The human body needs small amounts of cholesterol to help build cells. A person gets extra or excess cholesterol from the food that he or she eats. The blood carries cholesterol from the liver to the rest of the body. If you have high cholesterol, deposits (plaques) may build up on the walls of your arteries. Arteries are the blood vessels that carry blood away from your heart. These plaques make the arteries narrow and stiff. Cholesterol plaques increase your risk for heart attack and stroke. Work with your health care provider to keep your cholesterol levels in a healthy range. What increases the risk? The following factors may make you more likely to develop this condition:  Eating foods that are high in animal fat (saturated fat) or cholesterol.  Being overweight.  Not getting enough exercise.  A family history of high cholesterol (familial hypercholesterolemia).  Use of tobacco products.  Having diabetes. What are the signs or symptoms? There are no symptoms of this condition. How is this diagnosed? This condition may be diagnosed based on the results of a blood test.  If you are older than 72 years of age, your health care provider may check your cholesterol levels every 4-6 years.  You may be checked more often if you have high cholesterol or other risk factors for heart disease. The blood test for cholesterol measures:  "Bad" cholesterol, or LDL cholesterol. This is the main type of cholesterol that causes heart disease. The desired level is less than 100 mg/dL.  "Good" cholesterol, or HDL cholesterol. HDL helps protect against heart disease by cleaning the arteries and carrying the LDL to the liver for processing. The desired level for HDL is 60 mg/dL or higher.  Triglycerides.  These are fats that your body can store or burn for energy. The desired level is less than 150 mg/dL.  Total cholesterol. This measures the total amount of cholesterol in your blood and includes LDL, HDL, and triglycerides. The desired level is less than 200 mg/dL. How is this treated? This condition may be treated with:  Diet changes. You may be asked to eat foods that have more fiber and less saturated fats or added sugar.  Lifestyle changes. These may include regular exercise, maintaining a healthy weight, and quitting use of tobacco products.  Medicines. These are given when diet and lifestyle changes have not worked. You may be prescribed a statin medicine to help lower your cholesterol levels. Follow these instructions at home: Eating and drinking  Eat a healthy, balanced diet. This diet includes: ? Daily servings of a variety of fresh, frozen, or canned fruits and vegetables. ? Daily servings of whole grain foods that are rich in fiber. ? Foods that are low in saturated fats and trans fats. These include poultry and fish without skin, lean cuts of meat, and low-fat dairy products. ? A variety of fish, especially oily fish that contain omega-3 fatty acids. Aim to eat fish at least 2 times a week.  Avoid foods and drinks that have added sugar.  Use healthy cooking methods, such as roasting, grilling, broiling, baking, poaching, steaming, and stir-frying. Do not fry your food except for stir-frying.   Lifestyle  Get regular exercise. Aim to exercise for a total of 150 minutes a week. Increase your activity level by doing activities   such as gardening, walking, and taking the stairs.  Do not use any products that contain nicotine or tobacco, such as cigarettes, e-cigarettes, and chewing tobacco. If you need help quitting, ask your health care provider.   General instructions  Take over-the-counter and prescription medicines only as told by your health care provider.  Keep all  follow-up visits as told by your health care provider. This is important. Where to find more information  American Heart Association: www.heart.org  National Heart, Lung, and Blood Institute: www.nhlbi.nih.gov Contact a health care provider if:  You have trouble achieving or maintaining a healthy diet or weight.  You are starting an exercise program.  You are unable to stop smoking. Get help right away if:  You have chest pain.  You have trouble breathing.  You have any symptoms of a stroke. "BE FAST" is an easy way to remember the main warning signs of a stroke: ? B - Balance. Signs are dizziness, sudden trouble walking, or loss of balance. ? E - Eyes. Signs are trouble seeing or a sudden change in vision. ? F - Face. Signs are sudden weakness or numbness of the face, or the face or eyelid drooping on one side. ? A - Arms. Signs are weakness or numbness in an arm. This happens suddenly and usually on one side of the body. ? S - Speech. Signs are sudden trouble speaking, slurred speech, or trouble understanding what people say. ? T - Time. Time to call emergency services. Write down what time symptoms started.  You have other signs of a stroke, such as: ? A sudden, severe headache with no known cause. ? Nausea or vomiting. ? Seizure. These symptoms may represent a serious problem that is an emergency. Do not wait to see if the symptoms will go away. Get medical help right away. Call your local emergency services (911 in the [place] Do not drive yourself to the hospital. Summary  Cholesterol plaques increase your risk for heart attack and stroke. Work with your health care provider to keep your cholesterol levels in a healthy range.  Eat a healthy, balanced diet, get regular exercise, and maintain a healthy weight.  Do not use any products that contain nicotine or tobacco, such as cigarettes, e-cigarettes, and chewing tobacco.  Get help right away if you have any symptoms of a  stroke. This information is not intended to replace advice given to you by your health care provider. Make sure you discuss any questions you have with your health care provider. Document Revised: 05/02/2019 Document Reviewed: 05/02/2019 Elsevier Patient Education  2021 [employer] Inc.  

## 2020-10-16 NOTE — Assessment & Plan Note (Signed)
Chronic, ongoing. Current ASCVD <10%.  Poor tolerance of Atorvastatin in past, could consider Crestor if statin needed.  Recheck lipid panel today -- did eat cereal this morning.

## 2020-10-16 NOTE — Assessment & Plan Note (Signed)
Chronic, stable.  BP at goal on home readings and in office.  Continue Lisinopril and Amlodipine and adjust as needed, consider change to Losartan at next visit due to her underlying asthma which is well-controlled at this time.  Obtain labs today: CMP, lipid, CBC.  Continue to monitor BP at home a few days a week and document.  DASH diet focus.  Refills sent in. Return in 6 months.
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
## 2020-10-17 LAB — COMPREHENSIVE METABOLIC PANEL
ALT: 18 IU/L (ref 0–32)
AST: 16 IU/L (ref 0–40)
Albumin/Globulin Ratio: 1.5 (ref 1.2–2.2)
Albumin: 4.3 g/dL (ref 3.7–4.7)
Alkaline Phosphatase: 72 IU/L (ref 44–121)
BUN/Creatinine Ratio: 18 (ref 12–28)
BUN: 12 mg/dL (ref 8–27)
Bilirubin Total: <0.2 mg/dL (ref 0.0–1.2)
CO2: 23 mmol/L (ref 20–29)
Calcium: 9.4 mg/dL (ref 8.7–10.3)
Chloride: 102 mmol/L (ref 96–106)
Creatinine, Ser: 0.67 mg/dL (ref 0.57–1.00)
Globulin, Total: 2.8 g/dL (ref 1.5–4.5)
Glucose: 106 mg/dL — ABNORMAL HIGH (ref 65–99)
Potassium: 5 mmol/L (ref 3.5–5.2)
Sodium: 141 mmol/L (ref 134–144)
Total Protein: 7.1 g/dL (ref 6.0–8.5)
eGFR: 93 mL/min/{1.73_m2} (ref >59)

## 2020-10-17 LAB — CBC WITH DIFFERENTIAL/PLATELET
Basophils Absolute: 0 10*3/uL (ref 0.0–0.2)
Basos: 1 %
EOS (ABSOLUTE): 0 10*3/uL (ref 0.0–0.4)
Eos: 0 %
Hematocrit: 41.1 % (ref 34.0–46.6)
Hemoglobin: 13.4 g/dL (ref 11.1–15.9)
Immature Grans (Abs): 0 10*3/uL (ref 0.0–0.1)
Immature Granulocytes: 0 %
Lymphocytes Absolute: 1.8 10*3/uL (ref 0.7–3.1)
Lymphs: 37 %
MCH: 27.8 pg (ref 26.6–33.0)
MCHC: 32.6 g/dL (ref 31.5–35.7)
MCV: 85 fL (ref 79–97)
Monocytes Absolute: 0.5 10*3/uL (ref 0.1–0.9)
Monocytes: 9 %
Neutrophils Absolute: 2.5 10*3/uL (ref 1.4–7.0)
Neutrophils: 53 %
Platelets: 266 10*3/uL (ref 150–450)
RBC: 4.82 x10E6/uL (ref 3.77–5.28)
RDW: 14.2 % (ref 11.7–15.4)
WBC: 4.9 10*3/uL (ref 3.4–10.8)

## 2020-10-17 LAB — HEMOGLOBIN A1C
Est. average glucose Bld gHb Est-mCnc: 117 mg/dL
Hgb A1c MFr Bld: 5.7 % — ABNORMAL HIGH (ref 4.8–5.6)

## 2020-10-17 LAB — LIPID PANEL W/O CHOL/HDL RATIO
Cholesterol, Total: 166 mg/dL (ref 100–199)
HDL: 59 mg/dL (ref >39)
LDL Chol Calc (NIH): 91 mg/dL (ref 0–99)
Triglycerides: 89 mg/dL (ref 0–149)
VLDL Cholesterol Cal: 16 mg/dL (ref 5–40)

## 2020-10-17 NOTE — Progress Notes (Signed)
Good morning, please let Waylynn know her labs have returned.  Kidney function and liver are normal.  Cholesterol levels look better this check, at this time we can continue off medication and will monitor.  If any elevation in future we may consider starting Rosuvastatin as we discussed.  CBC shows no anemia.  The A1C is the diabetes testing we talked about, this looks at your blood sugars over the past 3 months and turns the average into a number.  Your number is 5.7%, meaning you are prediabetic.  Any number 5.7 to 6.4 is considered prediabetes and any number 6.5 or greater is considered diabetes.   I would recommend heavy focus on decreasing foods high in sugar and your intake of things like bread products, pasta, and rice.  The American Diabetes Association online has a large amount of information on diet changes to make.  We will recheck this number in 3 months to ensure you are not continuing to trend upwards and move into diabetes.  Have a good day. Keep being awesome!!  Thank you for allowing me to participate in your care. Kindest regards, Matheus Spiker

## 2020-10-25 DIAGNOSIS — M7542 Impingement syndrome of left shoulder: Secondary | ICD-10-CM | POA: Diagnosis not present

## 2020-10-26 ENCOUNTER — Other Ambulatory Visit: Payer: Self-pay | Admitting: Nurse Practitioner

## 2020-11-13 ENCOUNTER — Ambulatory Visit (INDEPENDENT_AMBULATORY_CARE_PROVIDER_SITE_OTHER): Payer: Medicare HMO | Admitting: Nurse Practitioner

## 2020-11-13 ENCOUNTER — Other Ambulatory Visit: Payer: Self-pay

## 2020-11-13 ENCOUNTER — Encounter: Payer: Self-pay | Admitting: Nurse Practitioner

## 2020-11-13 VITALS — BP 113/73 | HR 85 | Temp 98.1°F | Wt 250.2 lb

## 2020-11-13 DIAGNOSIS — M25512 Pain in left shoulder: Secondary | ICD-10-CM | POA: Diagnosis not present

## 2020-11-13 DIAGNOSIS — G44229 Chronic tension-type headache, not intractable: Secondary | ICD-10-CM | POA: Diagnosis not present

## 2020-11-13 MED ORDER — SHINGRIX 50 MCG/0.5ML IM SUSR: 0.5000 mL | Freq: Once | INTRAMUSCULAR | 0 refills | Status: AC

## 2020-11-13 MED ORDER — RIZATRIPTAN BENZOATE 5 MG PO TABS: 5.0000 mg | ORAL_TABLET | ORAL | 0 refills | Status: DC | PRN
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
## 2020-11-13 NOTE — Assessment & Plan Note (Signed)
Ongoing issue with no improvement with current treatment, concern for rotator cuff issue.  Continue to collaborate with ortho, she returns to see them tomorrow.  Recommend she continue taking Tylenol as needed at home + alternate heat and ice.  May use OTC Voltaren gel as needed.  If worsening consider addition of Tizanidine.  Return to office for worsening or ongoing, at this time continue collaboration with ortho.

## 2020-11-13 NOTE — Patient Instructions (Signed)
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
## 2020-11-14 DIAGNOSIS — M7542 Impingement syndrome of left shoulder: Secondary | ICD-10-CM | POA: Diagnosis not present

## 2020-11-16 ENCOUNTER — Emergency Department
Admission: EM | Admit: 2020-11-16 | Discharge: 2020-11-17 | Disposition: A | Discharge status: Home / Self Care | Payer: Medicare HMO | Attending: Emergency Medicine | Admitting: Emergency Medicine

## 2020-11-16 ENCOUNTER — Emergency Department: Payer: Medicare HMO

## 2020-11-16 ENCOUNTER — Other Ambulatory Visit: Payer: Self-pay

## 2020-11-16 DIAGNOSIS — I10 Essential (primary) hypertension: Secondary | ICD-10-CM | POA: Diagnosis not present

## 2020-11-16 DIAGNOSIS — Z87891 Personal history of nicotine dependence: Secondary | ICD-10-CM | POA: Insufficient documentation

## 2020-11-16 DIAGNOSIS — Z79899 Other long term (current) drug therapy: Secondary | ICD-10-CM | POA: Insufficient documentation

## 2020-11-16 DIAGNOSIS — M7989 Other specified soft tissue disorders: Secondary | ICD-10-CM | POA: Diagnosis present

## 2020-11-16 DIAGNOSIS — R0789 Other chest pain: Secondary | ICD-10-CM | POA: Diagnosis not present

## 2020-11-16 DIAGNOSIS — R002 Palpitations: Secondary | ICD-10-CM | POA: Diagnosis not present

## 2020-11-16 DIAGNOSIS — R600 Localized edema: Secondary | ICD-10-CM | POA: Insufficient documentation

## 2020-11-16 DIAGNOSIS — J45909 Unspecified asthma, uncomplicated: Secondary | ICD-10-CM | POA: Insufficient documentation

## 2020-11-16 DIAGNOSIS — R609 Edema, unspecified: Secondary | ICD-10-CM

## 2020-11-16 LAB — CBC WITH DIFFERENTIAL/PLATELET
Abs Immature Granulocytes: 0.01 10*3/uL (ref 0.00–0.07)
Basophils Absolute: 0 10*3/uL (ref 0.0–0.1)
Basophils Relative: 1 %
Eosinophils Absolute: 0.1 10*3/uL (ref 0.0–0.5)
Eosinophils Relative: 1 %
HCT: 40.5 % (ref 36.0–46.0)
Hemoglobin: 13.1 g/dL (ref 12.0–15.0)
Immature Granulocytes: 0 %
Lymphocytes Relative: 39 %
Lymphs Abs: 2.1 10*3/uL (ref 0.7–4.0)
MCH: 27.6 pg (ref 26.0–34.0)
MCHC: 32.3 g/dL (ref 30.0–36.0)
MCV: 85.4 fL (ref 80.0–100.0)
Monocytes Absolute: 0.5 10*3/uL (ref 0.1–1.0)
Monocytes Relative: 9 %
Neutro Abs: 2.6 10*3/uL (ref 1.7–7.7)
Neutrophils Relative %: 50 %
Platelets: 268 10*3/uL (ref 150–400)
RBC: 4.74 MIL/uL (ref 3.87–5.11)
RDW: 15 % (ref 11.5–15.5)
WBC: 5.3 10*3/uL (ref 4.0–10.5)
nRBC: 0 % (ref 0.0–0.2)

## 2020-11-16 LAB — BASIC METABOLIC PANEL
Anion gap: 9 (ref 5–15)
BUN: 13 mg/dL (ref 8–23)
CO2: 27 mmol/L (ref 22–32)
Calcium: 9 mg/dL (ref 8.9–10.3)
Chloride: 103 mmol/L (ref 98–111)
Creatinine, Ser: 0.51 mg/dL (ref 0.44–1.00)
GFR, Estimated: >60 mL/min (ref >60)
Glucose, Bld: 106 mg/dL — ABNORMAL HIGH (ref 70–99)
Potassium: 3.8 mmol/L (ref 3.5–5.1)
Sodium: 139 mmol/L (ref 135–145)

## 2020-11-16 LAB — BRAIN NATRIURETIC PEPTIDE: B Natriuretic Peptide: 25.6 pg/mL (ref 0.0–100.0)

## 2020-11-16 LAB — TROPONIN I (HIGH SENSITIVITY): Troponin I (High Sensitivity): 3 ng/L (ref <18)

## 2020-11-16 LAB — TSH: TSH: 2.958 u[IU]/mL (ref 0.350–4.500)

## 2020-11-16 NOTE — Discharge Instructions (Addendum)
Please seek medical attention for any high fevers, chest pain, shortness of breath, change in behavior, persistent vomiting, bloody stool or any other new or concerning symptoms.  

## 2020-11-16 NOTE — ED Triage Notes (Signed)
Pt presents to ER c/o heart palpitations & dizziness since last night and leg swelling that has been going on for around 2 weeks.  Pt denies any weight gain.  Denies hx of CHF, a-fib.  Does endorse hx of thyroid problems.  Pt currently A&O x4 with gcs 15.

## 2020-11-16 NOTE — ED Provider Notes (Signed)
[facility] Emergency Department Provider Note   ____________________________________________   I have reviewed the triage vital signs and the nursing notes.   HISTORY  Chief Complaint Leg Swelling and Palpitations   History limited by: Not Limited   HPI Jessica Brooks is a 70 y.o. female who presents to the emergency department today because of concern for heart palpitations and leg swelling. The patient states that she has had heart palpitations in the past but over the past couple of days they have been more frequent. She will feel like her heart is pounding and skipping beats. The patient states that she has also noticed bilateral leg swelling. She denies any new medications or change in medications. Furthermore she has complaints of increased urination with some strong odor, but denies dysuria.    Records reviewed. Per medical record review patient has a history of HLD, HTN.   Past Medical History:  Diagnosis Date  . Arthritis   . Asthma    uses inhaler  . Cataract   . Hyperlipidemia   . Hypertension   . Keloid     Patient Active Problem List   Diagnosis Date Noted  . Headache 10/16/2020  . Acute pain of left shoulder 10/16/2020  . Mixed hyperlipidemia 10/16/2020  . IFG (impaired fasting glucose) 10/16/2020  . Osteopenia of neck of right femur 07/12/2019  . Advanced directives, counseling/discussion 04/02/2018  . Low TSH level 10/19/2017  . Allergic rhinitis 12/08/2016  . Knee osteoarthritis 10/01/2016  . Hypertension 12/14/2015  . Keloid 11/20/2015  . Asthma 11/20/2015  . Morbid obesity with BMI of 40.0-44.9, adult (HCC) 11/20/2015    Past Surgical History:  Procedure Laterality Date  . ABDOMINAL HYSTERECTOMY    . BREAST BIOPSY Right    needle bx years ago unsure of date  . BREAST BIOPSY Right    another bx done by [facility]   . CATARACT EXTRACTION W/PHACO Left 06/11/2017   Procedure: CATARACT EXTRACTION PHACO AND  INTRAOCULAR LENS PLACEMENT (IOC)-LEFT;  Surgeon: Lockie Mola, MD;  Location: [facility] ORS;  Service: Ophthalmology;  Laterality: Left;  LOT # [id number] [place] :34.4 AP 15.3% CDE 5.29  . 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 MULTIPLE TOOTH EXTRACTIONS      Prior to Admission medications   Medication Sig Start Date End Date Taking? Authorizing Provider  albuterol (VENTOLIN HFA) 108 (90 Base) MCG/ACT inhaler Inhale 2 puffs into the lungs every 6 (six) hours as needed for wheezing or shortness of breath. 01/23/20   Cannady, Corrie Dandy T, NP  amLODipine (NORVASC) 10 MG tablet Take 1 tablet (10 mg total) by mouth daily. 10/16/20   Cannady, Corrie Dandy T, NP  Cholecalciferol (VITAMIN D PO) Take 1 capsule by mouth daily.     [provider]  levothyroxine (SYNTHROID) 100 MCG tablet Take 100 mcg by mouth daily before breakfast.    [provider]  lisinopril (ZESTRIL) 5 MG tablet Take 1 tablet (5 mg total) by mouth daily. 10/16/20   Cannady, Corrie Dandy T, NP  meloxicam (MOBIC) 15 MG tablet TAKE 1 TABLET EVERY DAY 10/26/20   Cannady, Jolene T, NP  montelukast (SINGULAIR) 10 MG tablet Take 1 tablet (10 mg total) by mouth at bedtime. 10/16/20   Cannady, Corrie Dandy T, NP  mupirocin ointment (BACTROBAN) 2 % APPLY TOPICALLY 2 (TWO) TIMES DAILY. 10/01/20   Cannady, Corrie Dandy T, NP  rizatriptan (MAXALT) 5 MG tablet Take 1 tablet (5 mg total) by mouth as needed  for migraine. May repeat in 2 hours if needed 11/13/20   Aura Dials T, NP  Zoster Vaccine Adjuvanted [facility]) injection Inject 0.5 mLs into the muscle once for 1 dose. Dose # 2 04/12/21 04/12/21  Aura Dials T, NP    Allergies Hydrochlorothiazide  Family History  Problem Relation Age of Onset  . Diabetes Mother   . Hypertension Mother   . Cancer Father        throat  . Diabetes Sister   . Kidney disease Sister   . Diabetes Brother   . Kidney disease Brother     Social History Social History   Tobacco Use  .  Smoking status: Former Smoker    Quit date: 06/16/1978    Years since quitting: 42.4  . Smokeless tobacco: Never Used  Vaping Use  . Vaping Use: Never used  Substance Use Topics  . Alcohol use: No  . Drug use: No    Review of Systems Constitutional: No fever/chills Eyes: No visual changes. ENT: No sore throat. Cardiovascular: Denies chest pain. Positive for palpitations. Respiratory: Denies shortness of breath. Gastrointestinal: No abdominal pain.  No nausea, no vomiting.  No diarrhea.   Genitourinary: Negative for dysuria. Positive for increased urination.  Musculoskeletal: Positive for leg swelling.  Skin: Negative for rash. Neurological: Negative for headaches, focal weakness or numbness.  ____________________________________________   PHYSICAL EXAM:  VITAL SIGNS: ED Triage Vitals  Enc Vitals Group     BP 11/16/20 1912 132/75     Pulse Rate 11/16/20 1912 (!) 101     Resp 11/16/20 1912 20     Temp 11/16/20 1912 98.6 F (37 C)     Temp Source 11/16/20 1912 Oral     SpO2 11/16/20 1912 97 %     Weight 11/16/20 1909 249 lb (112.9 kg)     Height 11/16/20 1909 5\' 5"  (1.651 m)     Head Circumference --      Peak Flow --      Pain Score 11/16/20 1909 0   Constitutional: Alert and oriented.  Eyes: Conjunctivae are normal.  ENT      Head: Normocephalic and atraumatic.      Nose: No congestion/rhinnorhea.      Mouth/Throat: Mucous membranes are moist.      Neck: No stridor. Hematological/Lymphatic/Immunilogical: No cervical lymphadenopathy. Cardiovascular: Normal rate, regular rhythm.  No murmurs, rubs, or gallops.  Respiratory: Normal respiratory effort without tachypnea nor retractions. Breath sounds are clear and equal bilaterally. No wheezes/rales/rhonchi. Gastrointestinal: Soft and non tender. No rebound. No guarding.  Genitourinary: Deferred Musculoskeletal: Normal range of motion in all extremities. Trace bilateral lower extremity edema.  Neurologic:  Normal  speech and language. No gross focal neurologic deficits are appreciated.  Skin:  Skin is warm, dry and intact. No rash noted. Psychiatric: Mood and affect are normal. Speech and behavior are normal. Patient exhibits appropriate insight and judgment.  ____________________________________________    LABS (pertinent positives/negatives)  BNP 25.6 Trop hs 3 BMP wnl except glu 106 CBC wbc 5.3, hgb 13.1, plt 268 TSH 2.958  ____________________________________________   EKG  I, 01/16/21, attending physician, personally viewed and interpreted this EKG  EKG Time: 1916 Rate: 100 Rhythm: normal sinus rhythm Axis: normal Intervals: qtc 456 QRS: narrow ST changes: no st elevation Impression: normal ekg  ____________________________________________    RADIOLOGY  CXR Lungs clear.  ____________________________________________   PROCEDURES  Procedures  ____________________________________________   INITIAL IMPRESSION / ASSESSMENT AND PLAN / ED COURSE  Pertinent  labs & imaging results that were available during my care of the patient were reviewed by me and considered in my medical decision making (see chart for details).   Patient presented to the emergency department today because of concern for leg swelling and palpitations. EKG without arrythmia. Blood work without concerning elevation of BNP, kidney failure. At this time think likely venous insufficiency causing leg swelling. Given lack of pain, warmth and bilateral nature of swelling doubt DVT. Discussed findings with patient. Do recommend cardiology follow up for possible holter monitor given palpitations.   ____________________________________________   FINAL CLINICAL IMPRESSION(S) / ED DIAGNOSES  Final diagnoses:  Palpitations  Peripheral edema     Note: This dictation was prepared with Dragon dictation. Any transcriptional errors that result from this process are unintentional     Phineas Semen,  MD 11/16/20 2318

## 2020-11-19 ENCOUNTER — Other Ambulatory Visit: Payer: Self-pay | Admitting: Nurse Practitioner

## 2020-11-19 NOTE — Telephone Encounter (Signed)
Scheduled 6/28

## 2020-11-19 NOTE — Telephone Encounter (Signed)
Requested medication (s) are due for refill today:   Possibly depending on how many she is using  Requested medication (s) are on the active medication list:   Yes  Future visit scheduled:   Yes   Last ordered: 11/13/2020 #10, 0 refills  Returned for provider to review for refills.   #10 were given.   She could potentially need a refill if taking 2 pills a day for migraines.     Requested Prescriptions  Pending Prescriptions Disp Refills   rizatriptan (MAXALT) 5 MG tablet [Pharmacy Med Name: RIZATRIPTAN BENZOATE 5 MG Tablet] 10 tablet 0    Sig: Take 1 tablet (5 mg total) by mouth as needed for migraine. May repeat in 2 hours if needed      Neurology:  Migraine Therapy - Triptan Failed - 11/19/2020  1:43 PM      Failed - Last BP in normal range    BP Readings from Last 1 Encounters:  11/16/20 135/90          Passed - Valid encounter within last 12 months    Recent Outpatient Visits           6 days ago Chronic tension-type headache, not intractable   [facility] Crisman, Fowler T, NP   1 month ago Morbid obesity with BMI of 40.0-44.9, adult (HCC)   [facility] Cannady, Jolene T, NP   3 months ago Morbid obesity with BMI of 40.0-44.9, adult (HCC)   [facility] Cannady, Jolene T, NP   10 months ago Morbid obesity with BMI of 40.0-44.9, adult (HCC)   [facility] [place], Wingate T, NP   1 year ago Acute left ankle pain   [facility] Valentino Nose, NP       Future Appointments             In 3 weeks Cannady, Dorie Rank, NP [facility]   In 2 months Cannady, Dorie Rank, NP [facility]

## 2020-11-21 ENCOUNTER — Ambulatory Visit
Admission: RE | Admit: 2020-11-21 | Discharge: 2020-11-21 | Disposition: A | Discharge status: Home / Self Care | Payer: Medicare HMO | Source: Ambulatory Visit | Attending: Nurse Practitioner | Admitting: Nurse Practitioner

## 2020-11-21 ENCOUNTER — Other Ambulatory Visit: Payer: Self-pay

## 2020-11-21 DIAGNOSIS — G44229 Chronic tension-type headache, not intractable: Secondary | ICD-10-CM | POA: Insufficient documentation

## 2020-11-21 DIAGNOSIS — R519 Headache, unspecified: Secondary | ICD-10-CM | POA: Diagnosis not present

## 2020-11-21 MED ORDER — GADOBUTROL 1 MMOL/ML IV SOLN
10.0000 mL | Freq: Once | INTRAVENOUS | Status: AC | PRN
  Administered 2020-11-21: 10 mL via INTRAVENOUS

## 2020-11-24 ENCOUNTER — Telehealth: Payer: Self-pay | Admitting: Nurse Practitioner

## 2020-11-24 MED ORDER — ROSUVASTATIN CALCIUM 10 MG PO TABS: 10.0000 mg | ORAL_TABLET | Freq: Every day | ORAL | 4 refills | Status: DC

## 2020-11-24 NOTE — Telephone Encounter (Signed)
Spoke to [address] on telephone and alerted her to MRI results, no acute findings or posttraumatic findings.  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  Discussed with her and she wishes to defer neurology at this time, but is agreeable to trial of Rosuvastatin to help low LDL and prevent stroke, last LDL 91.    Discussed that she was also noted to have a subcutaneous nodule in left cheek, she reports knowing where this is and having for long time, does not bother her.  Will monitor.  All questions answered and she stated appreciation for call.

## 2020-11-24 NOTE — Progress Notes (Signed)
Spoke to patient on telephone with MRI results, refer to telephone note 11/24/20.

## 2020-11-28 DIAGNOSIS — M7542 Impingement syndrome of left shoulder: Secondary | ICD-10-CM | POA: Diagnosis not present

## 2020-12-05 DIAGNOSIS — M7502 Adhesive capsulitis of left shoulder: Secondary | ICD-10-CM | POA: Diagnosis not present

## 2020-12-11 ENCOUNTER — Other Ambulatory Visit: Payer: Self-pay

## 2020-12-11 ENCOUNTER — Ambulatory Visit: Payer: Medicare HMO | Admitting: Nurse Practitioner

## 2020-12-11 ENCOUNTER — Ambulatory Visit (INDEPENDENT_AMBULATORY_CARE_PROVIDER_SITE_OTHER): Payer: Medicare HMO | Admitting: Nurse Practitioner

## 2020-12-11 ENCOUNTER — Encounter: Payer: Self-pay | Admitting: Nurse Practitioner

## 2020-12-11 VITALS — BP 104/69 | HR 89 | Temp 98.7°F | Wt 246.2 lb

## 2020-12-11 DIAGNOSIS — G44229 Chronic tension-type headache, not intractable: Secondary | ICD-10-CM | POA: Diagnosis not present

## 2020-12-11 DIAGNOSIS — Z8673 Personal history of transient ischemic attack (TIA), and cerebral infarction without residual deficits: Secondary | ICD-10-CM

## 2020-12-11 DIAGNOSIS — E782 Mixed hyperlipidemia: Secondary | ICD-10-CM | POA: Diagnosis not present

## 2020-12-11 NOTE — Assessment & Plan Note (Signed)
Chronic, ongoing. Current ASCVD <10%.  Poor tolerance of Atorvastatin in past.  Tolerating Crestor 10 MG, continue this regimen and plan to recheck fasting lipids next visit, discussed with patient.

## 2020-12-11 NOTE — Patient Instructions (Signed)
Chronic Migraine Headache A migraine headache is throbbing pain that is usually on one side of the head. Migraines that keep coming back are called recurring migraines. A migraine is called a chronic migraine if it happens at least 15 days in a month for more than 3 months. Talk with your doctor about what things may bring on (trigger) your migraines. What are the causes? The exact cause of this condition is not known. A migraine may be caused when nerves in the brain become irritated and release chemicals that cause irritation and swelling (inflammation) of blood vessels. The irritation and swelling of the blood vessels causes pain. Migraines may be brought on or caused by: Smoking. Foods and drinks, such as: Cheese. Chocolate. Alcohol. Caffeine. Certain substances in some foods or drinks. Some medicines. Other things that may bring on a migraine include: Periods, for women. Stress. Not enough sleep or too much sleep. Feeling very tired. Bright lights or loud noises. Smells Weather changes and being at high altitude. What increases the risk? The following factors may make you more likely to have chronic migraine: Having migraines or family members who have them. Being very sad (depressed) or feeling worried or nervous (anxious). Taking a lot of pain medicine. Having problems sleeping. Having heart disease, diabetes, or being very overweight (obese). What are the signs or symptoms? Symptoms of this condition include: Pain that feels like it throbs. Pain that is usually only on one side of the head. In some cases, the pain may be on both sides of the head or around the head or neck. Very bad pain that keeps you from doing daily activities. Pain that gets worse with activity. Feeling like you may vomit (feeling nauseous) or vomiting. Pain when you are around bright lights, loud noises, or activity. Being sensitive to bright lights, loud noises, or smells. Feeling dizzy. How is  this treated? This condition is treated with: Medicines. These help to: Lessen pain and the feeling like you may vomit. Prevent migraines. Changes to your diet or sleep. Therapy. This might include: Relaxation training. Biofeedback. This is a treatment that teaches you to relax, use your brain to lower your heart rate, and control your breathing. Cognitive behavioral therapy (CBT). This therapy helps you set goals and follow up on the changes that you make. Acupuncture. Using a device that provides electrical stimulation to your nerves, which can help take away pain. Surgery, if the other treatments do not work. Follow these instructions at home: Medicines Take over-the-counter and prescription medicines only as told by your doctor. Ask your doctor if the medicine prescribed to you requires you to avoid driving or using machinery. Lifestyle  Do not use any products that contain nicotine or tobacco, such as cigarettes, e-cigarettes, and chewing tobacco. If you need help quitting, ask your doctor. Do not drink alcohol. Get 7-9 hours of sleep each night. Lower the stress in your life. Ask your doctor about ways to do this. Stay at a healthy weight. Talk with your doctor if you need help losing weight. Get regular exercise. General instructions  Keep a journal to find out if certain things bring on migraines. For example, write down: What you eat and drink. How much sleep you get. Any change to your diet or medicines. Lie down in a dark, quiet room when you have a migraine. Try placing a cool towel over your head when you have a migraine. Keep lights dim if bright lights bother you or make your migraines worse. Keep   all follow-up visits as told by your doctor. This is important. Where to find more information Coalition for Headache and Migraine Patients (CHAMP): headachemigraine.org American Migraine Foundation: americanmigrainefoundation.org National Headache Foundation:  headaches.org Contact a doctor if: Medicine does not help your migraine. Your pain keeps coming back. Get help right away if: Your migraine becomes really bad and medicine does not help. You have a stiff neck and fever. You have trouble seeing. Your muscles are weak or you lose control of them. You lose your balance or have trouble walking. You feel like you will faint or you faint. You start having sudden, very bad headaches. You have a seizure. Summary A migraine headache is very bad, throbbing pain that is usually on one side of the head. A chronic migraine is a migraine that happens 15 days in a month for more than 3 months. Talk with your doctor about what things may bring on your migraines. Lie down in a dark, quiet room when you have a migraine. Keep a journal. This can help you find out if certain things make you have migraines. This information is not intended to replace advice given to you by your health care provider. Make sure you discuss any questions you have with your health care provider. Document Revised: 07/20/2019 Document Reviewed: 07/20/2019 Elsevier Patient Education  2022 Elsevier Inc.  

## 2020-12-11 NOTE — Assessment & Plan Note (Signed)
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  At this time recommend tight BP control and LDL control to prevent further incidents.  Recheck lipid panel next visit.
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
## 2021-01-15 DIAGNOSIS — E890 Postprocedural hypothyroidism: Secondary | ICD-10-CM | POA: Diagnosis not present

## 2021-01-21 ENCOUNTER — Other Ambulatory Visit: Payer: Self-pay | Admitting: Nurse Practitioner

## 2021-02-06 ENCOUNTER — Encounter: Payer: Self-pay | Admitting: Nurse Practitioner

## 2021-02-06 ENCOUNTER — Ambulatory Visit (INDEPENDENT_AMBULATORY_CARE_PROVIDER_SITE_OTHER): Payer: Medicare HMO | Admitting: Nurse Practitioner

## 2021-02-06 ENCOUNTER — Other Ambulatory Visit: Payer: Self-pay

## 2021-02-06 VITALS — BP 130/82 | HR 81 | Temp 98.3°F | Wt 238.6 lb

## 2021-02-06 DIAGNOSIS — R7989 Other specified abnormal findings of blood chemistry: Secondary | ICD-10-CM

## 2021-02-06 DIAGNOSIS — Z6841 Body Mass Index (BMI) 40.0 and over, adult: Secondary | ICD-10-CM

## 2021-02-06 DIAGNOSIS — E782 Mixed hyperlipidemia: Secondary | ICD-10-CM | POA: Diagnosis not present

## 2021-02-06 DIAGNOSIS — M85851 Other specified disorders of bone density and structure, right thigh: Secondary | ICD-10-CM

## 2021-02-06 DIAGNOSIS — J4520 Mild intermittent asthma, uncomplicated: Secondary | ICD-10-CM

## 2021-02-06 DIAGNOSIS — Z1231 Encounter for screening mammogram for malignant neoplasm of breast: Secondary | ICD-10-CM | POA: Diagnosis not present

## 2021-02-06 DIAGNOSIS — I10 Essential (primary) hypertension: Secondary | ICD-10-CM | POA: Diagnosis not present

## 2021-02-06 NOTE — Patient Instructions (Addendum)
[facility] at [facility]  Address: [address] [place], [place], [place] [address]  Phone: [phone]  === schedule both mammogram and DEXA scan for 05/02/21  Mammogram A mammogram is an X-ray of the breasts. This is done to check for changes that are not normal. This test can look for changes that may be caused by breastcancer or other problems. Mammograms are regularly done on women beginning at age 63. A man may have amammogram if he has a lump or swelling in his breast. Tell a doctor: About any allergies you have. If you have breast implants. If you have had breast disease, biopsy, or surgery. If you have a family history of breast cancer. If you are breastfeeding. Whether you are pregnant or may be pregnant. What are the risks? Generally, this is a safe procedure. But problems may occur, including: Being exposed to radiation. Radiation levels are very low with this test. The need for more tests. The results were not read properly. Trouble finding breast cancer in women with dense breasts. What happens before the test? Have this test done about 1-2 weeks after your menstrual period. This is often when your breasts are the least tender. If you are visiting a new doctor or clinic, have any past mammogram images sent to your new doctor's office. Wash your breasts and under your arms on the day of the test. Do not use deodorants, perfumes, lotions, or powders on the day of the test. Take off any jewelry from your neck. Wear clothes that you can change into and out of easily. What happens during the test?  You will take off your clothes from the waist up. You will put on a gown. You will stand in front of the X-ray machine. Each breast will be placed between two plastic or glass plates. The plates will press down on your breast for a few seconds. Try to relax. This does not cause any harm to your breasts. It may not feel comfortable, but it will be very  brief. X-rays will be taken from different angles of each breast. The procedure may vary among doctors and hospitals. What can I expect after the test? The mammogram will be read by a specialist (radiologist). You may need to do parts of the test again. This depends on the quality of the images. You may go back to your normal activities. It is up to you to get the results of your test. Ask how to get your results when they are ready. Summary A mammogram is an X-ray of the breasts. It looks for changes that may be caused by breast cancer or other problems. A man may have this test if he has a lump or swelling in his breast. Before the test, tell your doctor about any breast problems that you have had in the past. Have this test done about 1-2 weeks after your menstrual period. Ask when your test results will be ready. Make sure you get your test results. This information is not intended to replace advice given to you by your health care provider. Make sure you discuss any questions you have with your healthcare provider. Document Revised: 04/02/2020 Document Reviewed: 04/02/2020 Elsevier Patient Education  2022 [employer].

## 2021-02-06 NOTE — Assessment & Plan Note (Signed)
Chronic, stable.  BP at goal on home readings and in office.  Continue Lisinopril and Amlodipine and adjust as needed, consider change to Losartan at next visit due to her underlying asthma which is well-controlled at this time.  Obtain labs today: BMP and lipid panel.  Continue to monitor BP at home a few days a week and document.  DASH diet focus.  Return in 6 months for physical.

## 2021-02-06 NOTE — Assessment & Plan Note (Signed)
Chronic, ongoing. Continue Crestor as is tolerating without ADR, did not tolerate Atorvastatin in past.  Lipid panel today.  Return in 6 months. 

## 2021-02-06 NOTE — Assessment & Plan Note (Signed)
Followed by Dr. Solum.  Continue this collaboration, recent note reviewed with patient. 

## 2021-02-06 NOTE — Assessment & Plan Note (Signed)
Chronic, stable with minimal use of Albuterol.  Spirometry today with FEV1 = 84% and FEV1/FVC 90%.  Continue current medication regimen and adjust as needed.  Spirometry annually.  Return in 6 months.

## 2021-02-06 NOTE — Assessment & Plan Note (Signed)
Noted on DEXA 2017, repeat need October 2022.  Continue daily supplements, check Vit D level today.  DEXA ordered and patient aware to schedule with her mammogram in November.

## 2021-02-06 NOTE — Progress Notes (Signed)
BP 130/82   Pulse 81   Temp 98.3 F (36.8 C) (Oral)   Wt 238 lb 9.6 oz (108.2 kg)   SpO2 98%   BMI 39.71 kg/m    Subjective:    Patient ID: Jessica GuntherRosa S Stegner, female    DOB: 04-23-1951, 70 y.o.   MRN: [id number]  HPI: Jessica Brooks is a 70 y.o. female  Chief Complaint  Patient presents with   Asthma   Osteopenia   Hypothyroidism   Hypertension   OSTEOPENIA Last DEXA 04/14/2016 with T-score -1.8. Satisfied with current treatment?: yes  Adequate calcium & vitamin D: yes Weight bearing exercises: yes   HYPERTENSION Continues on Amlodipine and Lisinopril.  Changed to Crestor recently, due to side effects with Atorvastatin, and is tolerating this well.  Last saw cardiology 07/24/20, no changes made.  Has lost 11 pounds. Hypertension status: stable  Satisfied with current treatment? yes Duration of hypertension: chronic BP monitoring frequency:  daily BP range: 120/80's range at home BP medication side effects:  no Medication compliance: good compliance Aspirin: no Recurrent headaches: no Visual changes: no Palpitations: no Dyspnea: no Chest pain: no Lower extremity edema: no Dizzy/lightheaded: no  The 10-year ASCVD risk score Denman George(Goff DC Jr., et al., 2013) is: 11.1%   Values used to calculate the score:     Age: [age] years     Sex: Female     Is Non-Hispanic African American: Yes     Diabetic: No     Tobacco smoker: No     Systolic Blood Pressure: 130 mmHg     Is BP treated: Yes     HDL Cholesterol: 59 mg/dL     Total Cholesterol: 166 mg/dL   HYPOTHYRODISM Sees Dr. Tedd SiasSolum for toxic multinodular goiter s/p radioiodine ablation.  She continues on Levothyroxine 100 MCG and last saw endo 01/15/21. TSH with them was 1.160. Thyroid control status:stable Satisfied with current treatment? yes Medication side effects: no Medication compliance: good compliance Etiology of hypothyroidism:  Recent dose adjustment: none Fatigue: no Cold intolerance: no Heat  intolerance: no Weight gain: no Weight loss: no Constipation: yes Diarrhea/loose stools: no Palpitations: no Lower extremity edema: no Anxiety/depressed mood: no    ASTHMA Currently only Albuterol and Singulair use. Asthma status: stable Satisfied with current treatment?: yes Albuterol/rescue inhaler frequency: once a week, sometimes less Dyspnea frequency: none Wheezing frequency: none Cough frequency: none Nocturnal symptom frequency: none Limitation of activity: no Current upper respiratory symptoms: no Triggers: seasonal allergies Home peak flows: none Last Spirometry: unknown Failed/intolerant to following asthma meds:  Asthma meds in past: albuterol Aerochamber/spacer use: no Visits to ER or Urgent Care in past year: no Pneumovax: Up to Date Influenza: Up to Date  Relevant past medical, surgical, family and social history reviewed and updated as indicated. Interim medical history since our last visit reviewed. Allergies and medications reviewed and updated.  Review of Systems  Constitutional:  Negative for activity change, appetite change, diaphoresis, fatigue and fever.  Respiratory:  Negative for cough, chest tightness and shortness of breath.   Cardiovascular:  Negative for chest pain, palpitations and leg swelling.  Gastrointestinal: Negative.   Musculoskeletal:  Positive for arthralgias.  Neurological:  Positive for headaches. Negative for dizziness, seizures, syncope, speech difficulty, weakness and numbness.  Psychiatric/Behavioral: Negative.     Per HPI unless specifically indicated above     Objective:    BP 130/82   Pulse 81   Temp 98.3 F (36.8 C) (Oral)   Wt 238  lb 9.6 oz (108.2 kg)   SpO2 98%   BMI 39.71 kg/m   Wt Readings from Last 3 Encounters:  02/06/21 238 lb 9.6 oz (108.2 kg)  12/11/20 246 lb 3.2 oz (111.7 kg)  11/16/20 249 lb (112.9 kg)    Physical Exam Vitals and nursing note reviewed.  Constitutional:      General: She is  awake. She is not in acute distress.    Appearance: She is well-developed and well-groomed. She is morbidly obese. She is not ill-appearing or toxic-appearing.  HENT:     Head: Normocephalic.     Right Ear: Hearing, tympanic membrane, ear canal and external ear normal.     Left Ear: Hearing, tympanic membrane, ear canal and external ear normal.  Eyes:     General: Lids are normal.        Right eye: No discharge.        Left eye: No discharge.     Extraocular Movements: Extraocular movements intact.     Conjunctiva/sclera: Conjunctivae normal.     Pupils: Pupils are equal, round, and reactive to light.     Visual Fields: Right eye visual fields normal and left eye visual fields normal.  Neck:     Vascular: No carotid bruit.  Cardiovascular:     Rate and Rhythm: Normal rate and regular rhythm.     Heart sounds: Normal heart sounds. No murmur heard.   No gallop.  Pulmonary:     Effort: Pulmonary effort is normal. No accessory muscle usage or respiratory distress.     Breath sounds: Normal breath sounds.  Abdominal:     General: Bowel sounds are normal.     Palpations: Abdomen is soft.  Musculoskeletal:     Cervical back: Normal range of motion and neck supple.     Right lower leg: No edema.     Left lower leg: No edema.  Lymphadenopathy:     Cervical: No cervical adenopathy.  Skin:    General: Skin is warm and dry.  Neurological:     Mental Status: She is alert and oriented to person, place, and time.     Cranial Nerves: Cranial nerves are intact.     Motor: Motor function is intact.     Coordination: Coordination is intact.     Gait: Gait is intact.     Deep Tendon Reflexes: Reflexes are normal and symmetric.     Reflex Scores:      Brachioradialis reflexes are 2+ on the right side and 2+ on the left side.      Patellar reflexes are 2+ on the right side and 2+ on the left side. Psychiatric:        Attention and Perception: Attention normal.        Mood and Affect: Mood  normal.        Speech: Speech normal.        Behavior: Behavior normal. Behavior is cooperative.        Thought Content: Thought content normal.      Results for orders placed or performed during the hospital encounter of 11/16/20  CBC with Differential  Result Value Ref Range   WBC 5.3 4.0 - 10.5 K/uL   RBC 4.74 3.87 - 5.11 MIL/uL   Hemoglobin 13.1 12.0 - 15.0 g/dL   HCT 16.1 09.6 - 04.5 %   MCV 85.4 80.0 - 100.0 fL   MCH 27.6 26.0 - 34.0 pg   MCHC 32.3 30.0 - 36.0 g/dL  RDW 15.0 11.5 - 15.5 %   Platelets 268 150 - 400 K/uL   nRBC 0.0 0.0 - 0.2 %   Neutrophils Relative % 50 %   Neutro Abs 2.6 1.7 - 7.7 K/uL   Lymphocytes Relative 39 %   Lymphs Abs 2.1 0.7 - 4.0 K/uL   Monocytes Relative 9 %   Monocytes Absolute 0.5 0.1 - 1.0 K/uL   Eosinophils Relative 1 %   Eosinophils Absolute 0.1 0.0 - 0.5 K/uL   Basophils Relative 1 %   Basophils Absolute 0.0 0.0 - 0.1 K/uL   Immature Granulocytes 0 %   Abs Immature Granulocytes 0.01 0.00 - 0.07 K/uL  Basic metabolic panel  Result Value Ref Range   Sodium 139 135 - 145 mmol/L   Potassium 3.8 3.5 - 5.1 mmol/L   Chloride 103 98 - 111 mmol/L   CO2 27 22 - 32 mmol/L   Glucose, Bld 106 (H) 70 - 99 mg/dL   BUN 13 8 - 23 mg/dL   Creatinine, Ser 7.37 0.44 - 1.00 mg/dL   Calcium 9.0 8.9 - 10.6 mg/dL   GFR, Estimated >26 >94 mL/min   Anion gap 9 5 - 15  Brain natriuretic peptide  Result Value Ref Range   B Natriuretic Peptide 25.6 0.0 - 100.0 pg/mL  TSH  Result Value Ref Range   TSH 2.958 0.350 - 4.500 uIU/mL  Troponin I (High Sensitivity)  Result Value Ref Range   Troponin I (High Sensitivity) 3 <18 ng/L      Assessment & Plan:   Problem List Items Addressed This Visit       Cardiovascular and Mediastinum   Hypertension    Chronic, stable.  BP at goal on home readings and in office.  Continue Lisinopril and Amlodipine and adjust as needed, consider change to Losartan at next visit due to her underlying asthma which is  well-controlled at this time.  Obtain labs today: BMP and lipid panel.  Continue to monitor BP at home a few days a week and document.  DASH diet focus.  Return in 6 months for physical.      Relevant Orders   Basic metabolic panel     Respiratory   Asthma    Chronic, stable with minimal use of Albuterol.  Spirometry today with FEV1 = 84% and FEV1/FVC 90%.  Continue current medication regimen and adjust as needed.  Spirometry annually.  Return in 6 months.      Relevant Orders   Spirometry with graph (Completed)     Musculoskeletal and Integument   Osteopenia of neck of right femur    Noted on DEXA 2017, repeat need October 2022.  Continue daily supplements, check Vit D level today.  DEXA ordered and patient aware to schedule with her mammogram in November.      Relevant Orders   VITAMIN D 25 Hydroxy (Vit-D Deficiency, Fractures)   DG Bone Density     Other   Morbid obesity with BMI of 40.0-44.9, adult (HCC) - Primary    BMI 39.71 with underlying HTN and Asthma -- praised for losing 11 pounds.  Recommended eating smaller high protein, low fat meals more frequently and exercising 30 mins a day 5 times a week with a goal of 10-15lb weight loss in the next 3 months. Patient voiced their understanding and motivation to adhere to these recommendations.       Low TSH level    Followed by Dr. Tedd Sias.  Continue this collaboration, recent note  reviewed with patient.      Mixed hyperlipidemia    Chronic, ongoing. Continue Crestor as is tolerating without ADR, did not tolerate Atorvastatin in past.  Lipid panel today.  Return in 6 months.      Relevant Orders   Lipid Panel w/o Chol/HDL Ratio   Other Visit Diagnoses     Encounter for screening mammogram for malignant neoplasm of breast       Relevant Orders   MM 3D SCREEN BREAST BILATERAL        Follow up plan: Return in about 6 months (around 08/08/2021) for Annual physical .

## 2021-02-06 NOTE — Assessment & Plan Note (Signed)
BMI 39.71 with underlying HTN and Asthma -- praised for losing 11 pounds.  Recommended eating smaller high protein, low fat meals more frequently and exercising 30 mins a day 5 times a week with a goal of 10-15lb weight loss in the next 3 months. Patient voiced their understanding and motivation to adhere to these recommendations.

## 2021-02-07 LAB — BASIC METABOLIC PANEL
BUN/Creatinine Ratio: 15 (ref 12–28)
BUN: 10 mg/dL (ref 8–27)
CO2: 24 mmol/L (ref 20–29)
Calcium: 9.4 mg/dL (ref 8.7–10.3)
Chloride: 102 mmol/L (ref 96–106)
Creatinine, Ser: 0.65 mg/dL (ref 0.57–1.00)
Glucose: 85 mg/dL (ref 65–99)
Potassium: 4.9 mmol/L (ref 3.5–5.2)
Sodium: 141 mmol/L (ref 134–144)
eGFR: 94 mL/min/{1.73_m2} (ref >59)

## 2021-02-07 LAB — VITAMIN D 25 HYDROXY (VIT D DEFICIENCY, FRACTURES): Vit D, 25-Hydroxy: 43.3 ng/mL (ref 30.0–100.0)

## 2021-02-07 LAB — LIPID PANEL W/O CHOL/HDL RATIO
Cholesterol, Total: 134 mg/dL (ref 100–199)
HDL: 61 mg/dL (ref >39)
LDL Chol Calc (NIH): 60 mg/dL (ref 0–99)
Triglycerides: 64 mg/dL (ref 0–149)
VLDL Cholesterol Cal: 13 mg/dL (ref 5–40)

## 2021-02-07 NOTE — Progress Notes (Signed)
Good morning, please let Jessica Brooks know her labs have returned and everything looks fabulous.  We do not need to make any changes, continue all current medications.  Great job!! Keep being amazing!!  Thank you for allowing me to participate in your care.  I appreciate you. Kindest regards, [name]

## 2021-02-16 ENCOUNTER — Other Ambulatory Visit: Payer: Self-pay | Admitting: Nurse Practitioner

## 2021-02-17 NOTE — Telephone Encounter (Signed)
Requested medication (s) are due for refill today: yes  Requested medication (s) are on the active medication list: yes  Last refill:  10/01/20  Future visit scheduled: yes  Notes to clinic:  med not assigned to a protocol   Requested Prescriptions  Pending Prescriptions Disp Refills   mupirocin ointment (BACTROBAN) 2 % [Pharmacy Med Name: MUPIROCIN 2 % Ointment] 22 g 4    Sig: APPLY TOPICALLY 2 TIMES DAILY     Off-Protocol Failed - 02/16/2021  8:53 PM      Failed - Medication not assigned to a protocol, review manually.      Passed - Valid encounter within last 12 months    Recent Outpatient Visits           1 week ago Morbid obesity with BMI of 40.0-44.9, adult (HCC)   [facility] [place], [place] T, NP   2 months ago Cerebral infarction, remote, resolved   [facility], [place] T, NP   3 months ago Chronic tension-type headache, not intractable   [facility] [place], [place] T, NP   4 months ago Morbid obesity with BMI of 40.0-44.9, adult (HCC)   [facility] [place], Jolene T, NP   6 months ago Morbid obesity with BMI of 40.0-44.9, adult (HCC)   [facility] [place], Dorie Rank, NP       Future Appointments             In 5 months Cannady, Dorie Rank, NP [facility], [facility]

## 2021-02-27 ENCOUNTER — Ambulatory Visit (INDEPENDENT_AMBULATORY_CARE_PROVIDER_SITE_OTHER): Payer: Medicare HMO

## 2021-02-27 DIAGNOSIS — Z0000 Encounter for general adult medical examination without abnormal findings: Secondary | ICD-10-CM

## 2021-02-27 NOTE — Patient Instructions (Signed)
Health Maintenance, Female Adopting a healthy lifestyle and getting preventive care are important in promoting health and wellness. Ask your health care provider about: The right schedule for you to have regular tests and exams. Things you can do on your own to prevent diseases and keep yourself healthy. What should I know about diet, weight, and exercise? Eat a healthy diet  Eat a diet that includes plenty of vegetables, fruits, low-fat dairy products, and lean protein. Do not eat a lot of foods that are high in solid fats, added sugars, or sodium. Maintain a healthy weight Body mass index (BMI) is used to identify weight problems. It estimates body fat based on height and weight. Your health care provider can help determine your BMI and help you achieve or maintain a healthy weight. Get regular exercise Get regular exercise. This is one of the most important things you can do for your health. Most adults should: Exercise for at least 150 minutes each week. The exercise should increase your heart rate and make you sweat (moderate-intensity exercise). Do strengthening exercises at least twice a week. This is in addition to the moderate-intensity exercise. Spend less time sitting. Even light physical activity can be beneficial. Watch cholesterol and blood lipids Have your blood tested for lipids and cholesterol at 72 years of age, then have this test every 5 years. Have your cholesterol levels checked more often if: Your lipid or cholesterol levels are high. You are older than 72 years of age. You are at high risk for heart disease. What should I know about cancer screening? Depending on your health history and family history, you may need to have cancer screening at various ages. This may include screening for: Breast cancer. Cervical cancer. Colorectal cancer. Skin cancer. Lung cancer. What should I know about heart disease, diabetes, and high blood pressure? Blood pressure and heart  disease High blood pressure causes heart disease and increases the risk of stroke. This is more likely to develop in people who have high blood pressure readings, are of African descent, or are overweight. Have your blood pressure checked: Every 3-5 years if you are 18-39 years of age. Every year if you are 40 years old or older. Diabetes Have regular diabetes screenings. This checks your fasting blood sugar level. Have the screening done: Once every three years after age 40 if you are at a normal weight and have a low risk for diabetes. More often and at a younger age if you are overweight or have a high risk for diabetes. What should I know about preventing infection? Hepatitis B If you have a higher risk for hepatitis B, you should be screened for this virus. Talk with your health care provider to find out if you are at risk for hepatitis B infection. Hepatitis C Testing is recommended for: Everyone born from 1945 through 1965. Anyone with known risk factors for hepatitis C. Sexually transmitted infections (STIs) Get screened for STIs, including gonorrhea and chlamydia, if: You are sexually active and are younger than 72 years of age. You are older than 72 years of age and your health care provider tells you that you are at risk for this type of infection. Your sexual activity has changed since you were last screened, and you are at increased risk for chlamydia or gonorrhea. Ask your health care provider if you are at risk. Ask your health care provider about whether you are at high risk for HIV. Your health care provider may recommend a prescription medicine   to help prevent HIV infection. If you choose to take medicine to prevent HIV, you should first get tested for HIV. You should then be tested every 3 months for as long as you are taking the medicine. Pregnancy If you are about to stop having your period (premenopausal) and you may become pregnant, seek counseling before you get  pregnant. Take 400 to 800 micrograms (mcg) of folic acid every day if you become pregnant. Ask for birth control (contraception) if you want to prevent pregnancy. Osteoporosis and menopause Osteoporosis is a disease in which the bones lose minerals and strength with aging. This can result in bone fractures. If you are 65 years old or older, or if you are at risk for osteoporosis and fractures, ask your health care provider if you should: Be screened for bone loss. Take a calcium or vitamin D supplement to lower your risk of fractures. Be given hormone replacement therapy (HRT) to treat symptoms of menopause. Follow these instructions at home: Lifestyle Do not use any products that contain nicotine or tobacco, such as cigarettes, e-cigarettes, and chewing tobacco. If you need help quitting, ask your health care provider. Do not use street drugs. Do not share needles. Ask your health care provider for help if you need support or information about quitting drugs. Alcohol use Do not drink alcohol if: Your health care provider tells you not to drink. You are pregnant, may be pregnant, or are planning to become pregnant. If you drink alcohol: Limit how much you use to 0-1 drink a day. Limit intake if you are breastfeeding. Be aware of how much alcohol is in your drink. In the [place] one drink equals one 12 oz bottle of beer (355 mL), one 5 oz glass of wine (148 mL), or one 1 oz glass of hard liquor (44 mL). General instructions Schedule regular health, dental, and eye exams. Stay current with your vaccines. Tell your health care provider if: You often feel depressed. You have ever been abused or do not feel safe at home. Summary Adopting a healthy lifestyle and getting preventive care are important in promoting health and wellness. Follow your health care provider's instructions about healthy diet, exercising, and getting tested or screened for diseases. Follow your health care provider's  instructions on monitoring your cholesterol and blood pressure. This information is not intended to replace advice given to you by your health care provider. Make sure you discuss any questions you have with your health care provider. Document Revised: 08/10/2020 Document Reviewed: 05/26/2018 Elsevier Patient Education  2022 [employer]  

## 2021-02-27 NOTE — Progress Notes (Signed)
Subjective:   Jessica Brooks is a 70 y.o. female who presents for Medicare Annual (Subsequent) preventive examination.  I connected with  Taneka S Tinnon on 02/27/21 by an audio only telemedicine application and verified that I am speaking with the correct person using two identifiers.   I discussed the limitations, risks, security and privacy concerns of performing an evaluation and management service by telephone and the availability of in person appointments. I also discussed with the patient that there may be a patient responsible charge related to this service. The patient expressed understanding and verbally consented to this telephonic visit.  Location of Patient: Home Location of Provider: [facility]  Persons Participating: Jessica Brooks (Patient), Malen Gauze (CMA)  List any persons and their role that are participating in the visit with the patient.    Review of Systems    Defer to Provider Cardiac Risk Factors include: none     Objective:    Today's Vitals   02/27/21 1758  PainSc: 0-No pain   There is no height or weight on file to calculate BMI.  Advanced Directives 02/27/2021 11/16/2020 04/23/2020 06/11/2017 04/08/2017 12/14/2015 11/20/2015  Does Patient Have a Medical Advance Directive? No No No Yes Yes No No  Type of Advance Directive - - - Healthcare Power of Attorney - - -  Does patient want to make changes to medical advance directive? - - - No - Patient declined Yes (MAU/Ambulatory/Procedural Areas - Information given) - -  Copy of Healthcare Power of Attorney in Chart? - - - No - copy requested - - -  Would patient like information on creating a medical advance directive? No - Patient declined - No - Patient declined - - - No - patient declined information    Current Medications (verified) Outpatient Encounter Medications as of 02/27/2021  Medication Sig   albuterol (VENTOLIN HFA) 108 (90 Base) MCG/ACT inhaler Inhale 2 puffs into  the lungs every 6 (six) hours as needed for wheezing or shortness of breath.   amLODipine (NORVASC) 10 MG tablet TAKE 1 TABLET EVERY DAY   Cholecalciferol (VITAMIN D PO) Take 1 capsule by mouth daily.    levothyroxine (SYNTHROID) 100 MCG tablet Take 100 mcg by mouth daily before breakfast.   lisinopril (ZESTRIL) 5 MG tablet TAKE 1 TABLET EVERY DAY   meloxicam (MOBIC) 15 MG tablet TAKE 1 TABLET EVERY DAY   montelukast (SINGULAIR) 10 MG tablet TAKE 1 TABLET AT BEDTIME   mupirocin ointment (BACTROBAN) 2 % APPLY TOPICALLY 2 TIMES DAILY   rizatriptan (MAXALT) 5 MG tablet Take 1 tablet (5 mg total) by mouth as needed for migraine. May repeat in 2 hours if needed   rosuvastatin (CRESTOR) 10 MG tablet Take 1 tablet (10 mg total) by mouth daily.   [START ON 04/12/2021] Zoster Vaccine Adjuvanted [facility]) injection Inject 0.5 mLs into the muscle once for 1 dose. Dose # 2   No facility-administered encounter medications on file as of 02/27/2021.    Allergies (verified) Hydrochlorothiazide   History: Past Medical History:  Diagnosis Date   Arthritis    Asthma    uses inhaler   Cataract    Hyperlipidemia    Hypertension    Keloid    Past Surgical History:  Procedure Laterality Date   ABDOMINAL HYSTERECTOMY     BREAST BIOPSY Right    needle bx years ago unsure of date   BREAST BIOPSY Right    another bx done by [facility]    CATARACT  EXTRACTION W/PHACO Left 06/11/2017   Procedure: CATARACT EXTRACTION PHACO AND INTRAOCULAR LENS PLACEMENT (IOC)-LEFT;  Surgeon: Lockie Mola, MD;  Location: [facility] ORS;  Service: Ophthalmology;  Laterality: Left;  LOT # [id number] H [place] :34.4 AP 15.3% CDE 5.29   KELOID EXCISION N/A    KELOID REMOVAL TO NAVAL IN LATE 1980's   MULTIPLE TOOTH EXTRACTIONS     Family History  Problem Relation Age of Onset   Diabetes Mother    Hypertension Mother    Cancer Father        throat   Diabetes Sister    Kidney disease Sister    Diabetes Brother    Kidney  disease Brother    Social History   Socioeconomic History   Marital status: Divorced    Spouse name: Not on file   Number of children: Not on file   Years of education: Not on file   Highest education level: Not on file  Occupational History   Not on file  Tobacco Use   Smoking status: Former    Types: Cigarettes    Quit date: 06/16/1978    Years since quitting: 42.7   Smokeless tobacco: Never  Vaping Use   Vaping Use: Never used  Substance and Sexual Activity   Alcohol use: No   Drug use: No   Sexual activity: Never  Other Topics Concern   Not on file  Social History Narrative   Not on file   Social Determinants of Health   Financial Resource Strain: Low Risk    Difficulty of Paying Living Expenses: Not hard at all  Food Insecurity: No Food Insecurity   Worried About Programme researcher, broadcasting/film/video in the Last Year: Never true   Ran Out of Food in the Last Year: Never true  Transportation Needs: No Transportation Needs   Lack of Transportation (Medical): No   Lack of Transportation (Non-Medical): No  Physical Activity: Insufficiently Active   Days of Exercise per Week: 4 days   Minutes of Exercise per Session: 30 min  Stress: No Stress Concern Present   Feeling of Stress : Not at all  Social Connections: Moderately Isolated   Frequency of Communication with Friends and Family: More than three times a week   Frequency of Social Gatherings with Friends and Family: Three times a week   Attends Religious Services: 1 to 4 times per year   Active Member of Clubs or Organizations: No   Attends Banker Meetings: Never   Marital Status: Divorced    Tobacco Counseling Counseling given: Not Answered   Clinical Intake:  Pre-visit preparation completed: Yes  Pain : No/denies pain Pain Score: 0-No pain     Nutritional Risks: None Diabetes: No  How often do you need to have someone help you when you read instructions, pamphlets, or other written materials from  your doctor or pharmacy?: 1 - Never What is the last grade level you completed in school?: 11th  Diabetic? No  Interpreter Needed?: No      Activities of Daily Living In your present state of health, do you have any difficulty performing the following activities: 02/27/2021 12/11/2020  Hearing? N N  Vision? N N  Difficulty concentrating or making decisions? N N  Walking or climbing stairs? Y N  Dressing or bathing? N N  Doing errands, shopping? N N  Preparing Food and eating ? N -  Using the Toilet? N -  In the past six months, have you accidently leaked urine? N -  Do you have problems with loss of bowel control? N -  Managing your Medications? N -  Managing your Finances? N -  Housekeeping or managing your Housekeeping? N -  Some recent data might be hidden    Patient Care Team: Marjie Skiff, NP as PCP - General (Nurse Practitioner)  Indicate any recent Medical Services you may have received from other than [facility] providers in the past year (date may be approximate).     Assessment:   This is a routine wellness examination for Jessica Brooks.  Hearing/Vision screen No results found.  Dietary issues and exercise activities discussed: Current Exercise Habits: The patient does not participate in regular exercise at present, Exercise limited by: None identified   Goals Addressed   None   Depression Screen PHQ 2/9 Scores 02/27/2021 08/07/2020 07/12/2019 04/06/2019 04/02/2018 04/02/2018 04/08/2017  PHQ - 2 Score 0 0 0 0 1 1 0  PHQ- 9 Score - - - - 5 5 1     Fall Risk Fall Risk  02/27/2021 12/11/2020 07/12/2019 04/06/2019 01/05/2019  Falls in the past year? 0 0 0 0 0  Number falls in past yr: 0 0 0 0 0  Injury with Fall? 0 0 0 0 0  Risk for fall due to : No Fall Risks No Fall Risks - - History of fall(s)  Follow up Falls evaluation completed Falls evaluation completed Falls evaluation completed - Falls evaluation completed    FALL RISK PREVENTION PERTAINING TO THE HOME:  Any  stairs in or around the home? No  If so, are there any without handrails? No  Home free of loose throw rugs in walkways, pet beds, electrical cords, etc? Yes  Adequate lighting in your home to reduce risk of falls? Yes   ASSISTIVE DEVICES UTILIZED TO PREVENT FALLS:  Life alert? No  Use of a cane, walker or w/c? Yes  Grab bars in the bathroom? No  Shower chair or bench in shower? No  Elevated toilet seat or a handicapped toilet? Yes   TIMED UP AND GO:  Was the test performed? N/A.  Length of time to ambulate 10 feet: N/A sec.     Cognitive Function:     6CIT Screen 02/27/2021 04/08/2017  What Year? 0 points 0 points  What month? 0 points 0 points  What time? 0 points 0 points  Count back from 20 0 points 0 points  Months in reverse 0 points 0 points  Repeat phrase 0 points 0 points  Total Score 0 0    Immunizations Immunization History  Administered Date(s) Administered   Fluad Quad(high Dose 65+) 03/14/2019, 03/13/2020   Influenza, High Dose Seasonal PF 04/08/2017, 04/02/2018   Influenza-Unspecified 03/24/2016   PFIZER(Purple Top)SARS-COV-2 Vaccination 07/28/2019, 08/20/2019, 03/21/2020, 01/05/2021   Pneumococcal Conjugate-13 12/14/2015   Pneumococcal Polysaccharide-23 04/08/2017   Tdap 11/20/2015   Zoster Recombinat (Shingrix) 12/22/2020   Zoster, Live 11/20/2015    TDAP status: Up to date  Flu Vaccine status: Due, Education has been provided regarding the importance of this vaccine. Advised may receive this vaccine at local pharmacy or Health Dept. Aware to provide a copy of the vaccination record if obtained from local pharmacy or Health Dept. Verbalized acceptance and understanding.  Pneumococcal vaccine status: Up to date  Covid-19 vaccine status: Completed vaccines  Qualifies for Shingles Vaccine? Yes   Zostavax completed No   Shingrix Completed?: Yes  Screening Tests Health Maintenance  Topic Date Due   INFLUENZA VACCINE  01/14/2021   Zoster  Vaccines- Shingrix (2 of 2) 02/16/2021   Fecal DNA (Cologuard)  06/22/2021   MAMMOGRAM  05/02/2022   TETANUS/TDAP  11/19/2025   DEXA SCAN  Completed   COVID-19 Vaccine  Completed   Hepatitis C Screening  Completed   PNA vac Low Risk Adult  Completed   HPV VACCINES  Aged Out    Health Maintenance  Health Maintenance Due  Topic Date Due   INFLUENZA VACCINE  01/14/2021   Zoster Vaccines- Shingrix (2 of 2) 02/16/2021    Colorectal cancer screening: Type of screening: Cologuard. Completed 06/22/2018. Repeat every 3 years  Mammogram status: Completed 05/02/2020. Repeat every year  Bone Density status: Ordered 02/06/2021. Pt provided with contact info and advised to call to schedule appt.  Lung Cancer Screening: (Low Dose CT Chest recommended if Age 80-80 years, 30 pack-year currently smoking OR have quit w/in 15years.) does not qualify.   Lung Cancer Screening Referral: No  Additional Screening:  Hepatitis C Screening: does qualify; Completed 12/14/2015  Vision Screening: Recommended annual ophthalmology exams for early detection of glaucoma and other disorders of the eye. Is the patient up to date with their annual eye exam?  Yes  Who is the provider or what is the name of the office in which the patient attends annual eye exams? [facility] If pt is not established with a provider, would they like to be referred to a provider to establish care? No .   Dental Screening: Recommended annual dental exams for proper oral hygiene  Community Resource Referral / Chronic Care Management: CRR required this visit?  No   CCM required this visit?  No      Plan:     I have personally reviewed and noted the following in the patient's chart:   Medical and social history Use of alcohol, tobacco or illicit drugs  Current medications and supplements including opioid prescriptions.  Functional ability and status Nutritional status Physical activity Advanced directives List  of other physicians Hospitalizations, surgeries, and ER visits in previous 12 months Vitals Screenings to include cognitive, depression, and falls Referrals and appointments  In addition, I have reviewed and discussed with patient certain preventive protocols, quality metrics, and best practice recommendations. A written personalized care plan for preventive services as well as general preventive health recommendations were provided to patient.   Ms. Mcelhiney , Thank you for taking time to come for your Medicare Wellness Visit. I appreciate your ongoing commitment to your health goals. Please review the following plan we discussed and let me know if I can assist you in the future.   These are the goals we discussed:  Goals      Increase physical activity     Increase walking to 3x a week for at least 20 minutes        This is a list of the screening recommended for you and due dates:  Health Maintenance  Topic Date Due   Flu Shot  01/14/2021   Zoster (Shingles) Vaccine (2 of 2) 02/16/2021   Cologuard (Stool DNA test)  06/22/2021   Mammogram  05/02/2022   Tetanus Vaccine  11/19/2025   DEXA scan (bone density measurement)  Completed   COVID-19 Vaccine  Completed   Hepatitis C Screening: USPSTF Recommendation to screen - Ages 66-79 yo.  Completed   Pneumonia vaccines  Completed   HPV Vaccine  Aged [address]  [place], [place]   02/27/2021   Nurse Notes: Non  Face to Face 60 minutes

## 2021-03-20 ENCOUNTER — Other Ambulatory Visit: Payer: Self-pay | Admitting: Nurse Practitioner

## 2021-03-28 ENCOUNTER — Ambulatory Visit: Payer: Self-pay | Admitting: *Deleted

## 2021-03-28 NOTE — Telephone Encounter (Signed)
   Patient would like to know when she had a colonoscopy and pneumonia shot.      Pt calling for information on dates as listed. Did not find any information on colonoscopy. Pt states "Long time ago, could be 10 years." Last pneumonia vaccine ?  2018, unclear if received. Pt would like advise, should she have these done and if so referral. Pt denies any symptoms.  Assured pt NT would route to practice for PCPs review.  Pt states "I won't be home rest of the day, best to call back tomorrow."  PLease advise: [phone] Reason for Disposition  [1] Caller requesting NON-URGENT health information AND [2] PCP's office is the best resource  Answer Assessment - Initial Assessment Questions 1. REASON FOR CALL or QUESTION: "What is your reason for calling today?" or "How can I best help you?" or "What question do you have that I can help answer?"     Colonoscopy, vaccine questions.  Protocols used: Information Only Call - No Triage-A-AH

## 2021-03-29 NOTE — Telephone Encounter (Signed)
Patient was notified and made aware of Jolene's recommendations. Patient verbalized understanding and has no further questions at this time.

## 2021-05-06 ENCOUNTER — Ambulatory Visit
Admission: RE | Admit: 2021-05-06 | Discharge: 2021-05-06 | Disposition: A | Discharge status: Home / Self Care | Payer: Medicare HMO | Source: Ambulatory Visit | Attending: Nurse Practitioner | Admitting: Nurse Practitioner

## 2021-05-06 ENCOUNTER — Other Ambulatory Visit: Payer: Self-pay

## 2021-05-06 DIAGNOSIS — Z1231 Encounter for screening mammogram for malignant neoplasm of breast: Secondary | ICD-10-CM

## 2021-05-06 DIAGNOSIS — M8589 Other specified disorders of bone density and structure, multiple sites: Secondary | ICD-10-CM | POA: Diagnosis not present

## 2021-05-06 DIAGNOSIS — Z780 Asymptomatic menopausal state: Secondary | ICD-10-CM | POA: Diagnosis not present

## 2021-05-06 DIAGNOSIS — M85851 Other specified disorders of bone density and structure, right thigh: Secondary | ICD-10-CM | POA: Diagnosis not present

## 2021-05-06 NOTE — Progress Notes (Signed)
Please let Jessica Brooks know her mammogram returned normal and can repeat in one year.

## 2021-05-06 NOTE — Progress Notes (Signed)
Please let Kalanie know the following.  Your bone density shows thinning bones (osteopenia) but not brittle (osteoporosis). We recommend Vitamin D supplementation of about 2,0000 IUs of over the counter Vitamin D3. In addition, we recommend a diet high in calcium with dairy and dark green leafy vegetables. We would like you to get plenty of weight bearing exercises with walking and resistance training such as light weights or resistance bands available with instructions at places such as [employer].  Will repeat scan in 5 years.  Any questions? Have a great day!!

## 2021-07-24 ENCOUNTER — Other Ambulatory Visit: Payer: Self-pay

## 2021-07-24 ENCOUNTER — Encounter: Payer: Self-pay | Admitting: Nurse Practitioner

## 2021-07-24 ENCOUNTER — Ambulatory Visit (INDEPENDENT_AMBULATORY_CARE_PROVIDER_SITE_OTHER): Payer: Medicare HMO | Admitting: Nurse Practitioner

## 2021-07-24 VITALS — BP 115/74 | HR 96 | Temp 98.1°F | Ht 65.0 in | Wt 242.6 lb

## 2021-07-24 DIAGNOSIS — R9431 Abnormal electrocardiogram [ECG] [EKG]: Secondary | ICD-10-CM | POA: Insufficient documentation

## 2021-07-24 DIAGNOSIS — R399 Unspecified symptoms and signs involving the genitourinary system: Secondary | ICD-10-CM

## 2021-07-24 DIAGNOSIS — I10 Essential (primary) hypertension: Secondary | ICD-10-CM | POA: Diagnosis not present

## 2021-07-24 DIAGNOSIS — R002 Palpitations: Secondary | ICD-10-CM

## 2021-07-24 LAB — URINALYSIS, ROUTINE W REFLEX MICROSCOPIC
Bilirubin, UA: NEGATIVE
Glucose, UA: NEGATIVE
Ketones, UA: NEGATIVE
Leukocytes,UA: NEGATIVE
Nitrite, UA: NEGATIVE
Protein,UA: NEGATIVE
RBC, UA: NEGATIVE
Specific Gravity, UA: <1.005 — ABNORMAL LOW (ref 1.005–1.030)
Urobilinogen, Ur: 0.2 mg/dL (ref 0.2–1.0)
pH, UA: 6 (ref 5.0–7.5)

## 2021-07-24 LAB — WET PREP FOR TRICH, YEAST, CLUE
Clue Cell Exam: NEGATIVE
Trichomonas Exam: POSITIVE — AB
Yeast Exam: NEGATIVE

## 2021-07-24 MED ORDER — METOPROLOL SUCCINATE ER 25 MG PO TB24: 25.0000 mg | ORAL_TABLET | Freq: Every day | ORAL | 3 refills | Status: DC

## 2021-07-24 MED ORDER — AMLODIPINE BESYLATE 5 MG PO TABS: 5.0000 mg | ORAL_TABLET | Freq: Every day | ORAL | 4 refills | Status: DC

## 2021-07-24 MED ORDER — METRONIDAZOLE 500 MG PO TABS: 500.0000 mg | ORAL_TABLET | Freq: Two times a day (BID) | ORAL | 0 refills | Status: AC

## 2021-07-24 NOTE — Assessment & Plan Note (Addendum)
Elevated HR noted. Metoprolol 25 MG daily for HR. Reduced Amlodipine to 5 MG daily. Monitor BP closely at home. May consider stopping Amlodipine if BP decreases.  Will get scheduled with cardiology.

## 2021-07-24 NOTE — Assessment & Plan Note (Addendum)
In office wet prep order today was positive for trichomoniasis. Order for Metronidazole 500 MG po Bid for seven days sent to pharmacy.

## 2021-07-24 NOTE — Assessment & Plan Note (Addendum)
Will get back into cardiology. Patient was seen in the past by Dr. Azucena Cecil (Cardiologist) in the past. Patient requests to be seen by him.

## 2021-07-24 NOTE — Progress Notes (Signed)
BP 115/74    Pulse 96    Temp 98.1 F (36.7 C) (Oral)    Ht 5\' 5"  (1.651 m)    Wt 242 lb 9.6 oz (110 kg)    SpO2 98%    BMI 40.37 kg/m    Subjective:    Patient ID: Jessica Brooks, female    DOB: 10-04-1950, 71 y.o.   MRN: [id number]  HPI: Jessica Brooks is a 71 y.o. female  Chief Complaint  Patient presents with   Urinary Frequency    Patient states she has been "staying up all night and not getting rest, having to go to the bathroom." Patient states she has been dealing with this issues for the past year, but within the last couple of weeks it has gradually gotten worse. Patient states it is interfering with her sleep. Patient states she is only getting 3 hours of sleep.    BP Check    Patient states her blood pressure readings have been doing well. Patient denies having any concerns.    NOTE WRITTEN BY [employer] DNP STUDENT.  ASSESSMENT AND PLAN OF CARE REVIEWED WITH STUDENT, AGREE WITH ABOVE FINDINGS AND PLAN.   URINARY SYMPTOMS States she has been having increase in urinary frequency and urgency. This is affecting her sleep, states she had to use the restroom around 20 times yesterday. Dysuria: no Urinary frequency: yes Urgency: yes Small volume voids: no Symptom severity: yes Urinary incontinence: no Foul odor: no Hematuria: no Abdominal pain: no Back pain: no Suprapubic pain/pressure: no Flank pain: no Fever:  no Vomiting: no Relief with cranberry juice: did not try any treatment Relief with pyridium: did not try any treatment Status: worse Previous urinary tract infection: no Recurrent urinary tract infection: no Sexual activity: Not sexually active not in 2-3 years History of sexually transmitted disease:  Treatments attempted: none    HYPERTENSION Patient here today for BP check.  Reports palpitations, has history of similar back in 11/16/20 for which she went to ER.  Has seen cardiology in past for alternate reasons, last 07/24/20. Hypertension status:  controlled  Satisfied with current treatment? yes Duration of hypertension: years BP monitoring frequency:  daily BP range: 127/70-80s BP medication side effects:  no Medication compliance: excellent compliance Previous BP meds: Aspirin:  Recurrent headaches: no Visual changes: no Palpitations: yes states she occasionally feels fluttering  Dyspnea: no Chest pain: no Lower extremity edema: yes sometimes at night, wears compression socks during the day Dizzy/lightheaded: no  Relevant past medical, surgical, family and social history reviewed and updated as indicated. Interim medical history since our last visit reviewed. Allergies and medications reviewed and updated.  Review of Systems  Constitutional:  Negative for fatigue, fever and unexpected weight change.  Respiratory:  Negative for cough, chest tightness and shortness of breath.   Cardiovascular:  Positive for palpitations. Negative for chest pain and leg swelling.  Gastrointestinal:  Negative for abdominal distention, abdominal pain, diarrhea and nausea.  Endocrine: Positive for polyuria. Negative for polydipsia and polyphagia.  Genitourinary:  Positive for frequency and urgency. Negative for decreased urine volume, difficulty urinating, dyspareunia, dysuria, flank pain, genital sores, hematuria, pelvic pain, vaginal bleeding, vaginal discharge and vaginal pain.  Musculoskeletal:  Negative for back pain and myalgias.  Skin:  Negative for color change and rash.  Neurological:  Negative for dizziness, syncope, weakness, light-headedness and headaches.  Psychiatric/Behavioral: Negative.     Per HPI unless specifically indicated above     Objective:  BP 115/74    Pulse 96    Temp 98.1 F (36.7 C) (Oral)    Ht 5\' 5"  (1.651 m)    Wt 242 lb 9.6 oz (110 kg)    SpO2 98%    BMI 40.37 kg/m   Wt Readings from Last 3 Encounters:  07/24/21 242 lb 9.6 oz (110 kg)  02/06/21 238 lb 9.6 oz (108.2 kg)  12/11/20 246 lb 3.2 oz (111.7  kg)    Physical Exam Constitutional:      Appearance: Normal appearance. She is well-groomed. She is obese.  HENT:     Head: Normocephalic.  Eyes:     General: Lids are normal.     Conjunctiva/sclera: Conjunctivae normal.  Neck:     Thyroid: No thyromegaly or thyroid tenderness.     Vascular: No carotid bruit.  Cardiovascular:     Rate and Rhythm: Regular rhythm. Tachycardia present.     Pulses: Normal pulses.     Heart sounds: Normal heart sounds. No murmur heard.   No gallop.  Pulmonary:     Effort: Pulmonary effort is normal. No accessory muscle usage.     Breath sounds: Normal breath sounds. No wheezing or rhonchi.  Chest:     Chest wall: No tenderness.  Abdominal:     General: Abdomen is protuberant. Bowel sounds are normal. There is no distension.     Palpations: Abdomen is soft.     Tenderness: There is no abdominal tenderness.  Musculoskeletal:        General: No swelling.     Right lower leg: No edema.     Left lower leg: No edema.  Skin:    General: Skin is warm and dry.  Neurological:     General: No focal deficit present.     Mental Status: She is alert and oriented to person, place, and time.     Motor: No weakness.  Psychiatric:        Mood and Affect: Mood normal.        Behavior: Behavior normal. Behavior is cooperative.   EKG My review and personal interpretation at Time: 1130 Indication: palpitations  Rate: 106  Rhythm: sinus tachycardia Axis: left axis shift Other: no nonspecific st abn, no stemi, no lvh  Results for orders placed or performed in visit on 07/24/21  [place], YEAST, CLUE   Urine  Result Value Ref Range   Trichomonas Exam Positive (A) Negative   Yeast Exam Negative Negative   Clue Cell Exam Negative Negative  Urinalysis, Routine w reflex microscopic  Result Value Ref Range   Specific Gravity, UA <1.005 (L) 1.005 - 1.030   pH, UA 6.0 5.0 - 7.5   Color, UA Yellow Yellow   Appearance Ur Clear Clear   Leukocytes,UA  Negative Negative   Protein,UA Negative Negative/Trace   Glucose, UA Negative Negative   Ketones, UA Negative Negative   RBC, UA Negative Negative   Bilirubin, UA Negative Negative   Urobilinogen, Ur 0.2 0.2 - 1.0 mg/dL   Nitrite, UA Negative Negative      Assessment & Plan:   Problem List Items Addressed This Visit       Cardiovascular and Mediastinum   Hypertension - Primary    Chronic, stable.  BP at home readings 120's/70-80s and in office today 115/74. Reduce Amlodipine to 5 MG daily. Metoprolol 25 MG daily for HR. May consider stopping Amlodipine if BP decreases. BMP and lipid panel ordered today. Continue to monitor BP  at home a few days a week and document.  DASH diet focus.        Relevant Medications   metoprolol succinate (TOPROL-XL) 25 MG 24 hr tablet   amLODipine (NORVASC) 5 MG tablet     Other   Abnormal EKG    Will get back into cardiology. Patient was seen in the past by Dr. Garen Lah (Cardiologist) in the past. Patient requests to be seen by him.      Palpitations    Elevated HR noted. Metoprolol 25 MG daily for HR. Reduced Amlodipine to 5 MG daily. Monitor BP closely at home. May consider stopping Amlodipine if BP decreases.  Will get scheduled with cardiology.      Relevant Orders   EKG 12-Lead (Completed)   Basic metabolic panel   T4, free   TSH   Lipid Panel w/o Chol/HDL Ratio   CBC with Differential/Platelet   Urinary symptom or sign    In office wet prep order today was positive for trichomoniasis. Order for Metronidazole 500 MG po Bid for seven days sent to pharmacy.       Relevant Orders   Urinalysis, Routine w reflex microscopic (Completed)   WET PREP FOR TRICH, YEAST, CLUE    Time: 25 minutes, >50% spent counseling/or care coordination   Follow up plan: Return in about 4 weeks (around 08/21/2021).

## 2021-07-24 NOTE — Patient Instructions (Signed)
Sinus Tachycardia °Sinus tachycardia is a kind of fast heartbeat. In sinus tachycardia, the heart beats more than 100 times a minute. Sinus tachycardia starts in a part of the heart called the sinus node. Sinus tachycardia may be harmless, or it may be a sign of a serious condition. °What are the causes? °This condition may be caused by: °Exercise or exertion. °A fever. °Pain. °Loss of body fluids (dehydration). °Severe bleeding (hemorrhage). °Anxiety and stress. °Certain substances, including: °Alcohol. °Caffeine. °Tobacco and nicotine products. °Cold medicines. °Illegal drugs. °Medical conditions including: °Heart disease. °An infection. °An overactive thyroid (hyperthyroidism). °A lack of red blood cells (anemia). °What are the signs or symptoms? °Symptoms of this condition include: °A feeling that the heart is beating quickly (palpitations). °Suddenly noticing your heartbeat (cardiac awareness). °Dizziness. °Tiredness (fatigue). °Shortness of breath. °Chest pain. °Nausea. °Fainting. °How is this diagnosed? °This condition is diagnosed with: °A physical exam. °Other tests, such as: °Blood tests. °An electrocardiogram (ECG). This test measures the electrical activity of the heart. °Ambulatory cardiac monitor. This records your heartbeats for 24 hours or more. °You may be referred to a heart specialist (cardiologist). °How is this treated? °Treatment for this condition depends on the cause or the underlying condition. Treatment may involve: °Treating the underlying condition. °Taking new medicines or changing your current medicines as told by your health care provider. °Making changes to your diet or lifestyle. °Follow these instructions at home: °Lifestyle ° °Do not use any products that contain nicotine or tobacco, such as cigarettes and e-cigarettes. If you need help quitting, ask your health care provider. °Do not use illegal drugs, such as cocaine. °Learn relaxation methods to help you when you get stressed or  anxious. These include deep breathing. °Avoid caffeine or other stimulants. °Alcohol use ° °Do not drink alcohol if: °Your health care provider tells you not to drink. °You are pregnant, may be pregnant, or are planning to become pregnant. °If you drink alcohol, limit how much you have: °0-1 drink a day for women. °0-2 drinks a day for men. °Be aware of how much alcohol is in your drink. In the [place] one drink equals one typical bottle of beer (12 oz), one-half glass of wine (5 oz), or one shot of hard liquor (1½ oz). °General instructions °Drink enough fluids to keep your urine pale yellow. °Take over-the-counter and prescription medicines only as told by your health care provider. °Keep all follow-up visits as told by your health care provider. This is important. °Contact a health care provider if you have: °A fever. °Vomiting or diarrhea that does not go away. °Get help right away if you: °Have pain in your chest, upper arms, jaw, or neck. °Become weak or dizzy. °Feel faint. °Have palpitations that do not go away. °Summary °In sinus tachycardia, the heart beats more than 100 times a minute. °Sinus tachycardia may be harmless, or it may be a sign of a serious condition. °Treatment for this condition depends on the cause or the underlying condition. °Get help right away if you have pain in your chest, upper arms, jaw, or neck. °This information is not intended to replace advice given to you by your health care provider. Make sure you discuss any questions you have with your health care provider. °Document Revised: 10/11/2020 Document Reviewed: 10/11/2020 °Elsevier Patient Education © 2022 Elsevier Inc. ° °

## 2021-07-24 NOTE — Assessment & Plan Note (Addendum)
Chronic, stable.  BP at home readings 120's/70-80s and in office today 115/74. Reduce Amlodipine to 5 MG daily. Metoprolol 25 MG daily for HR. May consider stopping Amlodipine if BP decreases. BMP and lipid panel ordered today. Continue to monitor BP at home a few days a week and document.  DASH diet focus.

## 2021-07-25 LAB — BASIC METABOLIC PANEL
BUN/Creatinine Ratio: 22 (ref 12–28)
BUN: 13 mg/dL (ref 8–27)
CO2: 23 mmol/L (ref 20–29)
Calcium: 9.3 mg/dL (ref 8.7–10.3)
Chloride: 104 mmol/L (ref 96–106)
Creatinine, Ser: 0.58 mg/dL (ref 0.57–1.00)
Glucose: 108 mg/dL — ABNORMAL HIGH (ref 70–99)
Potassium: 4.3 mmol/L (ref 3.5–5.2)
Sodium: 141 mmol/L (ref 134–144)
eGFR: 96 mL/min/{1.73_m2} (ref >59)

## 2021-07-25 LAB — LIPID PANEL W/O CHOL/HDL RATIO
Cholesterol, Total: 118 mg/dL (ref 100–199)
HDL: 60 mg/dL (ref >39)
LDL Chol Calc (NIH): 46 mg/dL (ref 0–99)
Triglycerides: 54 mg/dL (ref 0–149)
VLDL Cholesterol Cal: 12 mg/dL (ref 5–40)

## 2021-07-25 LAB — TSH: TSH: 0.936 u[IU]/mL (ref 0.450–4.500)

## 2021-07-25 LAB — CBC WITH DIFFERENTIAL/PLATELET
Basophils Absolute: 0 10*3/uL (ref 0.0–0.2)
Basos: 1 %
EOS (ABSOLUTE): 0 10*3/uL (ref 0.0–0.4)
Eos: 1 %
Hematocrit: 40.4 % (ref 34.0–46.6)
Hemoglobin: 13 g/dL (ref 11.1–15.9)
Immature Grans (Abs): 0 10*3/uL (ref 0.0–0.1)
Immature Granulocytes: 0 %
Lymphocytes Absolute: 1.5 10*3/uL (ref 0.7–3.1)
Lymphs: 40 %
MCH: 27 pg (ref 26.6–33.0)
MCHC: 32.2 g/dL (ref 31.5–35.7)
MCV: 84 fL (ref 79–97)
Monocytes Absolute: 0.4 10*3/uL (ref 0.1–0.9)
Monocytes: 10 %
Neutrophils Absolute: 1.9 10*3/uL (ref 1.4–7.0)
Neutrophils: 48 %
Platelets: 248 10*3/uL (ref 150–450)
RBC: 4.82 x10E6/uL (ref 3.77–5.28)
RDW: 14.5 % (ref 11.7–15.4)
WBC: 3.8 10*3/uL (ref 3.4–10.8)

## 2021-07-25 LAB — T4, FREE: Free T4: 1.66 ng/dL (ref 0.82–1.77)

## 2021-07-25 NOTE — Progress Notes (Signed)
Good morning, please let Jessica Brooks know that her labs returned stable -- including thyroid labs.  Please make sure you schedule follow-up with cardiology to further assess heart rate.  Let me know if any issues with new medication.  Have a great day!! Keep being awesome!!  Thank you for allowing me to participate in your care.  I appreciate you. Kindest regards, Quintavious Rinck

## 2021-07-31 ENCOUNTER — Other Ambulatory Visit: Payer: Self-pay

## 2021-07-31 ENCOUNTER — Encounter: Payer: Self-pay | Admitting: Medical

## 2021-07-31 ENCOUNTER — Ambulatory Visit (INDEPENDENT_AMBULATORY_CARE_PROVIDER_SITE_OTHER): Payer: Medicare HMO | Admitting: Medical

## 2021-07-31 ENCOUNTER — Ambulatory Visit (INDEPENDENT_AMBULATORY_CARE_PROVIDER_SITE_OTHER): Payer: Medicare HMO

## 2021-07-31 VITALS — BP 128/82 | HR 76 | Ht 65.0 in | Wt 244.0 lb

## 2021-07-31 DIAGNOSIS — R002 Palpitations: Secondary | ICD-10-CM

## 2021-07-31 DIAGNOSIS — E039 Hypothyroidism, unspecified: Secondary | ICD-10-CM

## 2021-07-31 DIAGNOSIS — R009 Unspecified abnormalities of heart beat: Secondary | ICD-10-CM | POA: Diagnosis not present

## 2021-07-31 DIAGNOSIS — I10 Essential (primary) hypertension: Secondary | ICD-10-CM

## 2021-07-31 NOTE — Patient Instructions (Signed)
Medication Instructions:  Your physician recommends that you continue on your current medications as directed. Please refer to the Current Medication list given to you today.  *If you need a refill on your cardiac medications before your next appointment, please call your pharmacy*   Lab Work: None ordered If you have labs (blood work) drawn today and your tests are completely normal, you will receive your results only by: [place] (if you have MyChart) OR A paper copy in the mail If you have any lab test that is abnormal or we need to change your treatment, we will call you to review the results.   Testing/Procedures:  Your physician has recommended that you wear a Zio XT monitor for 1 week. This will be mailed to your home address in 4-5 business days.  This monitor is a medical device that records the hearts electrical activity. Doctors most often use these monitors to diagnose arrhythmias. Arrhythmias are problems with the speed or rhythm of the heartbeat. The monitor is a small device applied to your chest. You can wear one while you do your normal daily activities. While wearing this monitor if you have any symptoms to push the button and record what you felt. Once you have worn this monitor for the period of time provider prescribed (Usually 14 days), you will return the monitor device in the postage paid box. Once it is returned they will download the data collected and provide [place] with a report which the provider will then review and we will call you with those results. Important tips:  Avoid showering during the first 24 hours of wearing the monitor. Avoid excessive sweating to help maximize wear time. Do not submerge the device, no hot tubs, and no swimming pools. Keep any lotions or oils away from the patch. After 24 hours you may shower with the patch on. Take brief showers with your back facing the shower head.  Do not remove patch once it has been placed because that  will interrupt data and decrease adhesive wear time. Push the button when you have any symptoms and write down what you were feeling. Once you have completed wearing your monitor, remove and place into box which has postage paid and place in your outgoing mailbox.  If for some reason you have misplaced your box then call our office and we can provide another box and/or mail it off for you.      Follow-Up: At [facility], you and your health needs are our priority.  As part of our continuing mission to provide you with exceptional heart care, we have created designated Provider Care Teams.  These Care Teams include your primary Cardiologist (physician) and Advanced Practice Providers (APPs -  Physician Assistants and Nurse Practitioners) who all work together to provide you with the care you need, when you need it.  We recommend signing up for the patient portal called "MyChart".  Sign up information is provided on this After Visit Summary.  MyChart is used to connect with patients for Virtual Visits (Telemedicine).  Patients are able to view lab/test results, encounter notes, upcoming appointments, etc.  Non-urgent messages can be sent to your provider as well.   To learn more about what you can do with MyChart, go to [email or web address].    Your next appointment:   4 week(s)  The format for your next appointment:   In Person  Provider:   You may see Kate Sable, MD or one of the following Advanced Practice  Providers on your designated Care Team:   Murray Hodgkins, NP Christell Faith, PA-C Cadence Kathlen Mody, [place]    Other Instructions

## 2021-07-31 NOTE — Progress Notes (Signed)
Cardiology Office Note:    Date:  07/31/2021   ID:  Jessica Brooks, DOB 1951-01-10, MRN [id number]  PCP:  Marjie Skiff, NP  [facility] Cardiologist:  None  [facility] Electrophysiologist:  None   Referring MD: Marjie Skiff, NP   Chief Complaint: elevated HR  History of Present Illness:    Jessica Brooks is a 71 y.o. female with a hx of HTN who presents for elevated HR.   Seen 07/24/20 for hypertension and headaches. Reported SBPs up to 190s. She was on amlodipine and lisinopril. BP was controlled in the office.   Today, the patient reports PCP sent the patient over for elevated heart rate. She has a pulse ox at home and heart rate as in th 80s. She started patient on Toprol 25mg  daily and this has improved the heart rate. The patient repots heart racing, brief palpitations. No chest pain or SOB. No recent fever, chills, nausea, vomiting, LLE, orthopnea, pnd.  PCP visit earlier his month 2/8 reported heart rate of 96bpm. Patient is unsure if it was higher in the past.   Past Medical History:  Diagnosis Date   Arthritis    Asthma    uses inhaler   Cataract    Hyperlipidemia    Hypertension    Keloid     Past Surgical History:  Procedure Laterality Date   ABDOMINAL HYSTERECTOMY     BREAST BIOPSY Right    needle bx years ago unsure of date   BREAST BIOPSY Right    another bx done by byrnette    CATARACT EXTRACTION W/PHACO Left 06/11/2017   Procedure: CATARACT EXTRACTION PHACO AND INTRAOCULAR LENS PLACEMENT (IOC)-LEFT;  Surgeon: Lockie Mola, MD;  Location: [facility] ORS;  Service: Ophthalmology;  Laterality: Left;  LOT # [id number] H [place] :34.4 AP 15.3% CDE 5.29   KELOID EXCISION N/A    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   amLODipine (NORVASC) 5 MG tablet Take 1 tablet (5 mg total) by mouth daily.   Cholecalciferol (VITAMIN D PO) Take 1 capsule by mouth daily.    levothyroxine (SYNTHROID) 100 MCG tablet Take 100 mcg by mouth daily before breakfast.   lisinopril (ZESTRIL) 5 MG tablet TAKE 1 TABLET EVERY DAY   meloxicam (MOBIC) 15 MG tablet TAKE 1 TABLET EVERY DAY   metoprolol succinate (TOPROL-XL) 25 MG 24 hr tablet Take 1 tablet (25 mg total) by mouth daily.   metroNIDAZOLE (FLAGYL) 500 MG tablet Take 1 tablet (500 mg total) by mouth 2 (two) times daily for 7 days.   montelukast (SINGULAIR) 10 MG tablet TAKE 1 TABLET AT BEDTIME   mupirocin ointment (BACTROBAN) 2 % APPLY TOPICALLY 2 TIMES DAILY   rizatriptan (MAXALT) 5 MG tablet Take 1 tablet (5 mg total) by mouth as needed for migraine. May repeat in 2 hours if needed   rosuvastatin (CRESTOR) 10 MG tablet Take 1 tablet (10 mg total) by mouth daily.     Allergies:   Hydrochlorothiazide   Social History   Socioeconomic History   Marital status: Divorced    Spouse name: Not on file   Number of children: Not on file   Years of education: Not on file   Highest education level:  Not on file  Occupational History   Not on file  Tobacco Use   Smoking status: Former    Types: Cigarettes    Quit date: 06/16/1978    Years since quitting: 43.1   Smokeless tobacco: Never  Vaping Use   Vaping Use: Never used  Substance and Sexual Activity   Alcohol use: No   Drug use: No   Sexual activity: Never  Other Topics Concern   Not on file  Social History Narrative   Not on file   Social Determinants of Health   Financial Resource Strain: Low Risk    Difficulty of Paying Living Expenses: Not hard at all  Food Insecurity: No Food Insecurity   Worried About Programme researcher, broadcasting/film/video in the Last Year: Never true   Ran Out of Food in the Last Year: Never true  Transportation Needs: No Transportation Needs   Lack of Transportation (Medical): No    Lack of Transportation (Non-Medical): No  Physical Activity: Insufficiently Active   Days of Exercise per Week: 4 days   Minutes of Exercise per Session: 30 min  Stress: No Stress Concern Present   Feeling of Stress : Not at all  Social Connections: Moderately Isolated   Frequency of Communication with Friends and Family: More than three times a week   Frequency of Social Gatherings with Friends and Family: Three times a week   Attends Religious Services: 1 to 4 times per year   Active Member of Clubs or Organizations: No   Attends Banker Meetings: Never   Marital Status: Divorced     Family History: The patient's family history includes Cancer in her father; Diabetes in her brother, mother, and sister; Hypertension in her mother; Kidney disease in her brother and sister. There is no history of Breast cancer.  ROS:   Please see the history of present illness.     All other systems reviewed and are negative.  EKGs/Labs/Other Studies Reviewed:    The following studies were reviewed today:  N/A  EKG:  EKG is  ordered today.  The ekg ordered today demonstrates NSR, 76bpm, No ST/T wave changes  Recent Labs: 10/16/2020: ALT 18 11/16/2020: B Natriuretic Peptide 25.6 07/24/2021: BUN 13; Creatinine, Ser 0.58; Hemoglobin 13.0; Platelets 248; Potassium 4.3; Sodium 141; TSH 0.936  Recent Lipid Panel    Component Value Date/Time   CHOL 118 07/24/2021 1144   CHOL 166 04/29/2019 1019   TRIG 54 07/24/2021 1144   TRIG 63 04/29/2019 1019   HDL 60 07/24/2021 1144   VLDL 13 04/29/2019 1019   LDLCALC 46 07/24/2021 1144    Physical Exam:    VS:  BP 128/82 (BP Location: Left Arm, Patient Position: Sitting, Cuff Size: Large)    Pulse 76    Ht 5\' 5"  (1.651 m)    Wt 244 lb (110.7 kg)    SpO2 92%    BMI 40.60 kg/m     Wt Readings from Last 3 Encounters:  07/31/21 244 lb (110.7 kg)  07/24/21 242 lb 9.6 oz (110 kg)  02/06/21 238 lb 9.6 oz (108.2 kg)     GEN:  Well nourished, well  developed in no acute distress HEENT: Normal NECK: No JVD; No carotid bruits LYMPHATICS: No lymphadenopathy CARDIAC: RRR, no murmurs, rubs, gallops RESPIRATORY:  Clear to auscultation without rales, wheezing or rhonchi  ABDOMEN: Soft, non-tender, non-distended MUSCULOSKELETAL:  No edema; No deformity  SKIN: Warm and dry NEUROLOGIC:  Alert and oriented x 3 PSYCHIATRIC:  Normal affect   ASSESSMENT:    1. Palpitations   2. Hypothyroidism, unspecified type   3. Elevated heart rate with elevated blood pressure and diagnosis of hypertension   4. Essential hypertension    PLAN:    In order of problems listed above:  Palpitations Elevated heart rate PCP sent patient for elevated heart rate, previously started on Toprol. Patient is unsure how high her heart rate went, said she never saw it over 100. Mostly in the 80s at home. PCP did labs that were unremarkable. EKG today shows NSR with heart rate of 76bpm. Patient reports brief palpitations. No chest pain or SOB. I will order a 7 day heart monitor. TSH normal  Hypertension BP good. Continue amlodipine 5mg  daily, toprol-XL 25mg  daily, lisinopril 5mg  daily  Hypothyroidism Recent TSH was normal.   Disposition: Follow up in 1 month(s) with MD/APP    Signed, Kesha Hurrell David Stall, PA-C  07/31/2021 3:14 PM    [facility]

## 2021-08-04 DIAGNOSIS — R002 Palpitations: Secondary | ICD-10-CM

## 2021-08-05 ENCOUNTER — Other Ambulatory Visit: Payer: Self-pay | Admitting: Nurse Practitioner

## 2021-08-06 NOTE — Telephone Encounter (Signed)
Requested medication (s) are due for refill today - yes   Requested medication (s) are on the active medication list -yes  Future visit scheduled -yes  Last refill: 02/19/21 22g 4RF  Notes to clinic: Request RF: medication not assigned protocol  Requested Prescriptions  Pending Prescriptions Disp Refills   mupirocin ointment (BACTROBAN) 2 % [Pharmacy Med Name: MUPIROCIN 2 % Ointment] 22 g 4    Sig: APPLY TOPICALLY 2 TIMES DAILY     Off-Protocol Failed - 08/05/2021  2:35 PM      Failed - Medication not assigned to a protocol, review manually.      Passed - Valid encounter within last 12 months    Recent Outpatient Visits           1 week ago Primary hypertension   [place], [place] T, NP   6 months ago Morbid obesity with BMI of 40.0-44.9, adult ([place])   [place], Jolene T, NP   7 months ago Cerebral infarction, remote, resolved   [employer], [place] T, NP   8 months ago Chronic tension-type headache, not intractable   [place], [place] T, NP   9 months ago Morbid obesity with BMI of 40.0-44.9, adult ([place])   [place], Barbaraann Faster, NP       Future Appointments             In 1 week Venita Lick, NP [facility], [facility]   In 1 month Furth, Cadence H, PA-C [facility], [facility]               Requested Prescriptions  Pending Prescriptions Disp Refills   mupirocin ointment (BACTROBAN) 2 % [Pharmacy Med Name: MUPIROCIN 2 % Ointment] 22 g 4    Sig: APPLY TOPICALLY 2 TIMES DAILY     Off-Protocol Failed - 08/05/2021  2:35 PM      Failed - Medication not assigned to a protocol, review manually.      Passed - Valid encounter within last 12 months    Recent Outpatient Visits           1 week ago Primary hypertension   [place], [place] T, NP   6 months ago Morbid obesity with BMI of 40.0-44.9, adult ([place])    [place]ady, Jolene T, NP   7 months ago Cerebral infarction, remote, resolved   [employer], [place] T, NP   8 months ago Chronic tension-type headache, not intractable   [place], [place] T, NP   9 months ago Morbid obesity with BMI of 40.0-44.9, adult ([place])   [place], Barbaraann Faster, NP       Future Appointments             In 1 week Venita Lick, NP [facility], [facility]   In 1 month Furth, Cadence H, PA-C [place], [facility]

## 2021-08-11 NOTE — Patient Instructions (Signed)
Prediabetes Eating Plan °Prediabetes is a condition that causes blood sugar (glucose) levels to be higher than normal. This increases the risk for developing type 2 diabetes (type 2 diabetes mellitus). Working with a health care provider or nutrition specialist (dietitian) to make diet and lifestyle changes can help prevent the onset of diabetes. These changes may help you: °Control your blood glucose levels. °Improve your cholesterol levels. °Manage your blood pressure. °What are tips for following this plan? °Reading food labels °Read food labels to check the amount of fat, salt (sodium), and sugar in prepackaged foods. Avoid foods that have: °Saturated fats. °Trans fats. °Added sugars. °Avoid foods that have more than 300 milligrams (mg) of sodium per serving. Limit your sodium intake to less than 2,300 mg each day. °Shopping °Avoid buying pre-made and processed foods. °Avoid buying drinks with added sugar. °Cooking °Cook with olive oil. Do not use butter, lard, or ghee. °Bake, broil, grill, steam, or boil foods. Avoid frying. °Meal planning ° °Work with your dietitian to create an eating plan that is right for you. This may include tracking how many calories you take in each day. Use a food diary, notebook, or mobile application to track what you eat at each meal. °Consider following a Mediterranean diet. This includes: °Eating several servings of fresh fruits and vegetables each day. °Eating fish at least twice a week. °Eating one serving each day of whole grains, beans, nuts, and seeds. °Using olive oil instead of other fats. °Limiting alcohol. °Limiting red meat. °Using nonfat or low-fat dairy products. °Consider following a plant-based diet. This includes dietary choices that focus on eating mostly vegetables and fruit, grains, beans, nuts, and seeds. °If you have high blood pressure, you may need to limit your sodium intake or follow a diet such as the DASH (Dietary Approaches to Stop Hypertension) eating  plan. The DASH diet aims to lower high blood pressure. °Lifestyle °Set weight loss goals with help from your health care team. It is recommended that most people with prediabetes lose 7% of their body weight. °Exercise for at least 30 minutes 5 or more days a week. °Attend a support group or seek support from a mental health counselor. °Take over-the-counter and prescription medicines only as told by your health care provider. °What foods are recommended? °Fruits °Berries. Bananas. Apples. Oranges. Grapes. Papaya. Mango. Pomegranate. Kiwi. Grapefruit. Cherries. °Vegetables °Lettuce. Spinach. Peas. Beets. Cauliflower. Cabbage. Broccoli. Carrots. Tomatoes. Squash. Eggplant. Herbs. Peppers. Onions. Cucumbers. Brussels sprouts. °Grains °Whole grains, such as whole-wheat or whole-grain breads, crackers, cereals, and pasta. Unsweetened oatmeal. Bulgur. Barley. Quinoa. Brown rice. Corn or whole-wheat flour tortillas or taco shells. °Meats and other proteins °Seafood. Poultry without skin. Lean cuts of pork and beef. Tofu. Eggs. Nuts. Beans. °Dairy °Low-fat or fat-free dairy products, such as yogurt, cottage cheese, and cheese. °Beverages °Water. Tea. Coffee. Sugar-free or diet soda. Seltzer water. Low-fat or nonfat milk. Milk alternatives, such as soy or almond milk. °Fats and oils °Olive oil. Canola oil. Sunflower oil. Grapeseed oil. Avocado. Walnuts. °Sweets and desserts °Sugar-free or low-fat pudding. Sugar-free or low-fat ice cream and other frozen treats. °Seasonings and condiments °Herbs. Sodium-free spices. Mustard. Relish. Low-salt, low-sugar ketchup. Low-salt, low-sugar barbecue sauce. Low-fat or fat-free mayonnaise. °The items listed above may not be a complete list of recommended foods and beverages. Contact a dietitian for more information. °What foods are not recommended? °Fruits °Fruits canned with syrup. °Vegetables °Canned vegetables. Frozen vegetables with butter or cream sauce. °Grains °Refined white  flour and flour   products, such as bread, pasta, snack foods, and cereals. °Meats and other proteins °Fatty cuts of meat. Poultry with skin. Breaded or fried meat. Processed meats. °Dairy °Full-fat yogurt, cheese, or milk. °Beverages °Sweetened drinks, such as iced tea and soda. °Fats and oils °Butter. Lard. Ghee. °Sweets and desserts °Baked goods, such as cake, cupcakes, pastries, cookies, and cheesecake. °Seasonings and condiments °Spice mixes with added salt. Ketchup. Barbecue sauce. Mayonnaise. °The items listed above may not be a complete list of foods and beverages that are not recommended. Contact a dietitian for more information. °Where to find more information °American Diabetes Association: www.diabetes.org °Summary °You may need to make diet and lifestyle changes to help prevent the onset of diabetes. These changes can help you control blood sugar, improve cholesterol levels, and manage blood pressure. °Set weight loss goals with help from your health care team. It is recommended that most people with prediabetes lose 7% of their body weight. °Consider following a Mediterranean diet. This includes eating plenty of fresh fruits and vegetables, whole grains, beans, nuts, seeds, fish, and low-fat dairy, and using olive oil instead of other fats. °This information is not intended to replace advice given to you by your health care provider. Make sure you discuss any questions you have with your health care provider. °Document Revised: 09/01/2019 Document Reviewed: 09/01/2019 °Elsevier Patient Education © 2022 Elsevier Inc. ° °

## 2021-08-14 ENCOUNTER — Encounter: Payer: Self-pay | Admitting: Nurse Practitioner

## 2021-08-14 ENCOUNTER — Ambulatory Visit (INDEPENDENT_AMBULATORY_CARE_PROVIDER_SITE_OTHER): Payer: Medicare HMO | Admitting: Nurse Practitioner

## 2021-08-14 ENCOUNTER — Other Ambulatory Visit: Payer: Self-pay

## 2021-08-14 VITALS — BP 124/81 | HR 76 | Temp 98.2°F | Ht 65.0 in | Wt 241.2 lb

## 2021-08-14 DIAGNOSIS — Z1211 Encounter for screening for malignant neoplasm of colon: Secondary | ICD-10-CM | POA: Diagnosis not present

## 2021-08-14 DIAGNOSIS — E782 Mixed hyperlipidemia: Secondary | ICD-10-CM | POA: Diagnosis not present

## 2021-08-14 DIAGNOSIS — M85851 Other specified disorders of bone density and structure, right thigh: Secondary | ICD-10-CM | POA: Diagnosis not present

## 2021-08-14 DIAGNOSIS — R7989 Other specified abnormal findings of blood chemistry: Secondary | ICD-10-CM | POA: Diagnosis not present

## 2021-08-14 DIAGNOSIS — Z6841 Body Mass Index (BMI) 40.0 and over, adult: Secondary | ICD-10-CM

## 2021-08-14 DIAGNOSIS — R002 Palpitations: Secondary | ICD-10-CM

## 2021-08-14 DIAGNOSIS — I10 Essential (primary) hypertension: Secondary | ICD-10-CM | POA: Diagnosis not present

## 2021-08-14 DIAGNOSIS — J4520 Mild intermittent asthma, uncomplicated: Secondary | ICD-10-CM

## 2021-08-14 DIAGNOSIS — L910 Hypertrophic scar: Secondary | ICD-10-CM

## 2021-08-14 DIAGNOSIS — R7301 Impaired fasting glucose: Secondary | ICD-10-CM

## 2021-08-14 DIAGNOSIS — Z0000 Encounter for general adult medical examination without abnormal findings: Secondary | ICD-10-CM | POA: Diagnosis not present

## 2021-08-14 LAB — MICROALBUMIN, URINE WAIVED
Creatinine, Urine Waived: 100 mg/dL (ref 10–300)
Microalb, Ur Waived: 10 mg/L (ref 0–19)
Microalb/Creat Ratio: <30 mg/g (ref <30)

## 2021-08-14 LAB — BAYER DCA HB A1C WAIVED: HB A1C (BAYER DCA - WAIVED): 5.1 % (ref 4.8–5.6)

## 2021-08-14 NOTE — Assessment & Plan Note (Signed)
Followed by Dr. Tedd Sias.  Continue this collaboration, recent note reviewed. ?

## 2021-08-14 NOTE — Assessment & Plan Note (Addendum)
DEXA scan 05/06/2021 reviewed, BMD measured at Femur Neck Right is 0.796 g/cm2 with a T-score of -1.7.  Continue daily supplements, check Vit D level today.   ?

## 2021-08-14 NOTE — Assessment & Plan Note (Signed)
Noticed on chest and hands. Was referred to dermatology for a large keloid in past. ?

## 2021-08-14 NOTE — Assessment & Plan Note (Signed)
Chronic, ongoing. Continue Crestor as is tolerating without ADR, did not tolerate Atorvastatin in past.  Lipid panel today.  Return in 6 months. 

## 2021-08-14 NOTE — Assessment & Plan Note (Addendum)
Noted on past labs, A1c improved was 5.7% on 10/16/20, A1c 5.1% today. Patient was praised for this accomplishment. Continue current diet and exercise regimen. ?

## 2021-08-14 NOTE — Assessment & Plan Note (Signed)
Chronic, stable.  Patient states she rarely uses Albuterol  Continue current medication regimen and adjust as needed.  Spirometry annually.  Return in 6 months. ?

## 2021-08-14 NOTE — Assessment & Plan Note (Signed)
Chronic, stable.  BP at home readings 130's/70-80s and in office today 124/81. Amlodipine was reduced to 5 MG daily and Metoprolol 25 MG started for HR. Patient is tolerating, continue with is regimen. CMP and lipid panel ordered today. Continue to monitor BP at home a few days a week and document.  DASH diet focus.  ?

## 2021-08-14 NOTE — Assessment & Plan Note (Signed)
Elevated HR noted on last visit. Patient was started on Metoprolol 25 MG daily for HR and referred to cardiology. States remote cardiac monitoring ordered by cardiology awaiting results. Continue to monitor BP closely at home. May consider stopping Amlodipine if BP decreases.   ?

## 2021-08-14 NOTE — Assessment & Plan Note (Signed)
BMI 40.14 today with underlying HTN and Asthma. Recommended eating smaller high protein, low fat meals more frequently and exercising 30 mins a day 5 times a week with a goal of 10-15lb weight loss in the next 3 months. Patient voiced their understanding and motivation to adhere to these recommendations. ? ?

## 2021-08-14 NOTE — Progress Notes (Signed)
NOTE WRITTEN BY [employer] DNP STUDENT.  ASSESSMENT AND PLAN OF CARE REVIEWED WITH STUDENT, AGREE WITH ABOVE FINDINGS AND PLAN.   BP 124/81    Pulse 76    Temp 98.2 F (36.8 C) (Oral)    Ht 5\' 5"  (1.651 m)    Wt 109.4 kg    SpO2 99%    BMI 40.14 kg/m    Subjective:    Patient ID: Jessica Brooks, female    DOB: 07-05-1950, 71 y.o.   MRN: [id number]  Vit D, saw cardiology was on heart monitor for 7 days, not sure of results yet. HPI: Jessica Brooks is a 72 y.o. female presenting on 08/14/2021 for comprehensive medical examination. Current medical complaints include:none  She currently lives with: alone Menopausal Symptoms: no  HYPERTENSION / HYPERLIPIDEMIA Satisfied with current treatment? yes Duration of hypertension: chronic BP monitoring frequency: weekly BP range: 139/70-80  BP medication side effects: no Past BP meds:  Duration of hyperlipidemia: chronic Cholesterol medication side effects: no Cholesterol supplements: none Past cholesterol medications:  Medication compliance: excellent compliance Aspirin: no Recent stressors: no Recurrent headaches: no Visual changes: no Palpitations: no Dyspnea: no Chest pain: no Lower extremity edema: no Dizzy/lightheaded: no   PREDIABETES Polydipsia/polyuria: no Visual disturbance: no Chest pain: no Paresthesias: no  OSTEOPENIA Satisfied with current treatment?: yes Medication side effects: no Adequate calcium & vitamin D: yes Weight bearing exercises: no   ASTHMA Asthma status: stable Satisfied with current treatment?: yes Albuterol/rescue inhaler frequency: states she has not used it a long while Dyspnea frequency:  Wheezing frequency: no Cough frequency: no Nocturnal symptom frequency:  Limitation of activity: no Current upper respiratory symptoms: no Triggers:  Home peak flows: Last Spirometry:  Failed/intolerant to following asthma meds:  Asthma meds in past:  Aerochamber/spacer use: no Visits to ER  or Urgent Care in past year: no Pneumovax: Up to Date Influenza: Up to Date   Depression Screen done today and results listed below:  Depression screen [facility] [place] 2/9 08/14/2021 07/24/2021 02/27/2021 08/07/2020 07/12/2019  Decreased Interest 0 1 0 0 0  Down, Depressed, Hopeless 0 1 0 0 0  PHQ - 2 Score 0 2 0 0 0  Altered sleeping 1 2 - - -  Tired, decreased energy 1 1 - - -  Change in appetite 0 3 - - -  Feeling bad or failure about yourself  0 0 - - -  Trouble concentrating 0 0 - - -  Moving slowly or fidgety/restless 0 0 - - -  Suicidal thoughts 0 0 - - -  PHQ-9 Score 2 8 - - -  Difficult doing work/chores - - - - -    The patient does not have a history of falls. I did not complete a risk assessment for falls. A plan of care for falls was not documented.   Past Medical History:  Past Medical History:  Diagnosis Date   Arthritis    Asthma    uses inhaler   Cataract    Hyperlipidemia    Hypertension    Keloid     Surgical History:  Past Surgical History:  Procedure Laterality Date   ABDOMINAL HYSTERECTOMY     BREAST BIOPSY Right    needle bx years ago unsure of date   BREAST BIOPSY Right    another bx done by byrnette    CATARACT EXTRACTION W/PHACO Left 06/11/2017   Procedure: CATARACT EXTRACTION PHACO AND INTRAOCULAR LENS PLACEMENT (IOC)-LEFT;  Surgeon: Leandrew Koyanagi, MD;  Location: [facility] ORS;  Service: Ophthalmology;  Laterality: Left;  LOT # [id number] H [place] :34.4 AP 15.3% CDE 5.29   KELOID EXCISION N/A    KELOID REMOVAL TO NAVAL IN LATE 1980's   MULTIPLE TOOTH EXTRACTIONS      Medications:  Current Outpatient Medications on File Prior to Visit  Medication Sig   albuterol (VENTOLIN HFA) 108 (90 Base) MCG/ACT inhaler Inhale 2 puffs into the lungs every 6 (six) hours as needed for wheezing or shortness of breath.   amLODipine (NORVASC) 5 MG tablet Take 1 tablet (5 mg total) by mouth daily.   Cholecalciferol (VITAMIN D PO) Take 1 capsule by mouth daily.    levothyroxine  (SYNTHROID) 100 MCG tablet Take 100 mcg by mouth daily before breakfast.   lisinopril (ZESTRIL) 5 MG tablet TAKE 1 TABLET EVERY DAY   meloxicam (MOBIC) 15 MG tablet TAKE 1 TABLET EVERY DAY   metoprolol succinate (TOPROL-XL) 25 MG 24 hr tablet Take 1 tablet (25 mg total) by mouth daily.   montelukast (SINGULAIR) 10 MG tablet TAKE 1 TABLET AT BEDTIME   mupirocin ointment (BACTROBAN) 2 % APPLY TOPICALLY 2 TIMES DAILY   rizatriptan (MAXALT) 5 MG tablet Take 1 tablet (5 mg total) by mouth as needed for migraine. May repeat in 2 hours if needed   rosuvastatin (CRESTOR) 10 MG tablet Take 1 tablet (10 mg total) by mouth daily.   No current facility-administered medications on file prior to visit.    Allergies:  Allergies  Allergen Reactions   Hydrochlorothiazide Palpitations    Jittery    Social History:  Social History   Socioeconomic History   Marital status: Divorced    Spouse name: Not on file   Number of children: Not on file   Years of education: Not on file   Highest education level: Not on file  Occupational History   Not on file  Tobacco Use   Smoking status: Former    Types: Cigarettes    Quit date: 06/16/1978    Years since quitting: 43.1   Smokeless tobacco: Never  Vaping Use   Vaping Use: Never used  Substance and Sexual Activity   Alcohol use: No   Drug use: No   Sexual activity: Never  Other Topics Concern   Not on file  Social History Narrative   Not on file   Social Determinants of Health   Financial Resource Strain: Low Risk    Difficulty of Paying Living Expenses: Not hard at all  Food Insecurity: No Food Insecurity   Worried About Charity fundraiser in the Last Year: Never true   [place] in the Last Year: Never true  Transportation Needs: No Transportation Needs   Lack of Transportation (Medical): No   Lack of Transportation (Non-Medical): No  Physical Activity: Insufficiently Active   Days of Exercise per Week: 4 days   Minutes of  Exercise per Session: 30 min  Stress: No Stress Concern Present   Feeling of Stress : Not at all  Social Connections: Moderately Isolated   Frequency of Communication with Friends and Family: More than three times a week   Frequency of Social Gatherings with Friends and Family: Three times a week   Attends Religious Services: 1 to 4 times per year   Active Member of Clubs or Organizations: No   Attends Archivist Meetings: Never   Marital Status: Divorced  Human resources officer Violence: Not At Risk   Fear of Current or Ex-Partner: No  Emotionally Abused: No   Physically Abused: No   Sexually Abused: No   Social History   Tobacco Use  Smoking Status Former   Types: Cigarettes   Quit date: 06/16/1978   Years since quitting: 43.1  Smokeless Tobacco Never   Social History   Substance and Sexual Activity  Alcohol Use No    Family History:  Family History  Problem Relation Age of Onset   Diabetes Mother    Hypertension Mother    Cancer Father        throat   Diabetes Sister    Kidney disease Sister    Diabetes Brother    Kidney disease Brother    Breast cancer Neg Hx     Past medical history, surgical history, medications, allergies, family history and social history reviewed with patient today and changes made to appropriate areas of the chart.   Review of Systems  Constitutional:  Negative for chills, fever, malaise/fatigue and weight loss.  HENT:  Negative for hearing loss and tinnitus.   Eyes:  Negative for blurred vision, double vision and photophobia.  Respiratory:  Negative for cough, hemoptysis, shortness of breath and wheezing.   Cardiovascular:  Negative for chest pain, palpitations and leg swelling.  Gastrointestinal:  Negative for abdominal pain, constipation, diarrhea, nausea and vomiting.  Genitourinary:  Negative for dysuria, frequency and urgency.  Musculoskeletal:  Negative for back pain, falls, myalgias and neck pain.  Skin:  Negative for rash.   Neurological:  Negative for dizziness, tingling, weakness and headaches.  Endo/Heme/Allergies:  Negative for polydipsia.  Psychiatric/Behavioral:  Negative for depression and suicidal ideas. The patient is not nervous/anxious and does not have insomnia.    All other ROS negative except what is listed above and in the HPI.      Objective:    BP 124/81    Pulse 76    Temp 98.2 F (36.8 C) (Oral)    Ht 5\' 5"  (1.651 m)    Wt 109.4 kg    SpO2 99%    BMI 40.14 kg/m   Wt Readings from Last 3 Encounters:  08/14/21 109.4 kg  07/31/21 110.7 kg  07/24/21 110 kg    Physical Exam Constitutional:      General: She is not in acute distress.    Appearance: Normal appearance. She is well-developed and well-groomed.  HENT:     Head: Normocephalic and atraumatic.     Jaw: No tenderness or swelling.     Salivary Glands: Right salivary gland is not diffusely enlarged or tender. Left salivary gland is not diffusely enlarged or tender.     Nose: Nose normal.     Mouth/Throat:     Mouth: Mucous membranes are moist.  Eyes:     General: Lids are normal. No scleral icterus.       Right eye: No discharge.        Left eye: No discharge.     Extraocular Movements: Extraocular movements intact.     Conjunctiva/sclera: Conjunctivae normal.     Pupils: Pupils are equal, round, and reactive to light.  Neck:     Thyroid: No thyroid mass, thyromegaly or thyroid tenderness.     Vascular: No carotid bruit.     Trachea: No tracheal tenderness.  Cardiovascular:     Rate and Rhythm: Normal rate and regular rhythm.     Pulses:          Dorsalis pedis pulses are 2+ on the right side and 2+ on the  left side.     Heart sounds: Normal heart sounds. No murmur heard.   No gallop.  Pulmonary:     Effort: Pulmonary effort is normal. No accessory muscle usage or respiratory distress.     Breath sounds: Normal breath sounds. No wheezing or rhonchi.  Abdominal:     General: Bowel sounds are normal. There is no  distension.     Palpations: Abdomen is soft. There is no mass.     Tenderness: There is no abdominal tenderness. There is no right CVA tenderness or left CVA tenderness.  Musculoskeletal:        General: No swelling or tenderness.     Cervical back: Normal range of motion. No tenderness.     Right lower leg: No edema.     Left lower leg: No edema.  Lymphadenopathy:     Head:     Right side of head: No submental, submandibular, tonsillar, preauricular or posterior auricular adenopathy.     Left side of head: No submental, submandibular, tonsillar, preauricular or posterior auricular adenopathy.     Cervical: No cervical adenopathy.     Right cervical: No superficial, deep or posterior cervical adenopathy.    Left cervical: No superficial, deep or posterior cervical adenopathy.     Upper Body:     Right upper body: No supraclavicular adenopathy.     Left upper body: No supraclavicular adenopathy.  Skin:    General: Skin is warm and dry.     Capillary Refill: Capillary refill takes less than 2 seconds.  Neurological:     General: No focal deficit present.     Mental Status: She is alert and oriented to person, place, and time.  Psychiatric:        Attention and Perception: Attention normal.        Mood and Affect: Mood and affect normal. Mood is not anxious or depressed.        Behavior: Behavior normal. Behavior is cooperative.        Thought Content: Thought content normal.        Judgment: Judgment normal.   Results for orders placed or performed in visit on 08/14/21  Microalbumin, Urine Waived  Result Value Ref Range   Microalb, Ur Waived 10 0 - 19 mg/L   Creatinine, Urine Waived 100 10 - 300 mg/dL   Microalb/Creat Ratio <30 <30 mg/g  Bayer DCA Hb A1c Waived  Result Value Ref Range   HB A1C (BAYER DCA - WAIVED) 5.1 4.8 - 5.6 %      Assessment & Plan:   Problem List Items Addressed This Visit       Cardiovascular and Mediastinum   Hypertension    Chronic, stable.  BP  at home readings 130's/70-80s and in office today 124/81. Amlodipine was reduced to 5 MG daily and Metoprolol 25 MG started for HR. Patient is tolerating, continue with is regimen. CMP and lipid panel ordered today. Continue to monitor BP at home a few days a week and document.  DASH diet focus.       Relevant Orders   CBC with Differential/Platelet   Comprehensive metabolic panel     Respiratory   Asthma    Chronic, stable.  Patient states she rarely uses Albuterol  Continue current medication regimen and adjust as needed.  Spirometry annually.  Return in 6 months.      Relevant Orders   CBC with Differential/Platelet     Endocrine   IFG (impaired fasting glucose)  Noted on past labs, A1c improved was 5.7% on 10/16/20, A1c 5.1% today. Patient was praised for this accomplishment. Continue current diet and exercise regimen.      Relevant Orders   Microalbumin, Urine Waived (Completed)   Bayer DCA Hb A1c Waived (Completed)     Musculoskeletal and Integument   Keloid    Noticed on chest and hands. Was referred to dermatology for a large keloid in past.      Osteopenia of neck of right femur    DEXA scan 05/06/2021 reviewed, BMD measured at Femur Neck Right is 0.796 g/cm2 with a T-score of -1.7.  Continue daily supplements, check Vit D level today.        Relevant Orders   VITAMIN D 25 Hydroxy (Vit-D Deficiency, Fractures)     Other   Morbid obesity with BMI of 40.0-44.9, adult (HCC) - Primary    BMI 40.14 today with underlying HTN and Asthma. Recommended eating smaller high protein, low fat meals more frequently and exercising 30 mins a day 5 times a week with a goal of 10-15lb weight loss in the next 3 months. Patient voiced their understanding and motivation to adhere to these recommendations.       Low TSH level    Followed by Dr. Gabriel Carina.  Continue this collaboration, recent note reviewed.      Mixed hyperlipidemia    Chronic, ongoing. Continue Crestor as is tolerating  without ADR, did not tolerate Atorvastatin in past.  Lipid panel today.  Return in 6 months.      Relevant Orders   Comprehensive metabolic panel   Lipid Panel w/o Chol/HDL Ratio   Palpitations    Elevated HR noted on last visit. Patient was started on Metoprolol 25 MG daily for HR and referred to cardiology. States remote cardiac monitoring ordered by cardiology awaiting results. Continue to monitor BP closely at home. May consider stopping Amlodipine if BP decreases.        Other Visit Diagnoses     Colon cancer screening       Relevant Orders   Cologuard   Encounter for annual physical exam       Annual physical with labs today and health maintenance reviewed.        Follow up plan: Return in about 6 months (around 02/14/2022) for HTN/HLD, PREDIABETES, ASTHMA, MIGRAINES.   LABORATORY TESTING:  - Pap smear: not applicable  IMMUNIZATIONS:   - Tdap: Tetanus vaccination status reviewed: last tetanus booster within 10 years. - Influenza: Up to date - Pneumovax: Up to date - Prevnar: Up to date - COVID: Up to date - HPV: Not applicable - Shingrix vaccine: Up to date  SCREENING: -Mammogram: Up to date 05/06/21 - Colonoscopy: Ordered today  - Bone Density: Up to date 05/06/21 -Hearing Test:  -Spirometry: Not applicable   PATIENT COUNSELING:   Advised to take 1 mg of folate supplement per day if capable of pregnancy.   Sexuality: Discussed sexually transmitted diseases, partner selection, use of condoms, avoidance of unintended pregnancy  and contraceptive alternatives.   Diet: Encouraged to adjust caloric intake to maintain  or achieve ideal body weight, to reduce intake of dietary saturated fat and total fat, to limit sodium intake by avoiding high sodium foods and not adding table salt, and to maintain adequate dietary potassium and calcium preferably from fresh fruits, vegetables, and low-fat dairy products.    Stressed the importance of regular exercise  Injury  prevention: Discussed safety belts, safety helmets, smoke detector,  smoking near bedding or upholstery.   Dental health: Discussed importance of regular tooth brushing, flossing, and dental visits.    NEXT PREVENTATIVE PHYSICAL DUE IN 1 YEAR. Return in about 6 months (around 02/14/2022) for HTN/HLD, PREDIABETES, ASTHMA, MIGRAINES.

## 2021-08-15 LAB — COMPREHENSIVE METABOLIC PANEL
ALT: 19 IU/L (ref 0–32)
AST: 17 IU/L (ref 0–40)
Albumin/Globulin Ratio: 1.3 (ref 1.2–2.2)
Albumin: 4 g/dL (ref 3.7–4.7)
Alkaline Phosphatase: 56 IU/L (ref 44–121)
BUN/Creatinine Ratio: 19 (ref 12–28)
BUN: 11 mg/dL (ref 8–27)
Bilirubin Total: 0.3 mg/dL (ref 0.0–1.2)
CO2: 24 mmol/L (ref 20–29)
Calcium: 9.4 mg/dL (ref 8.7–10.3)
Chloride: 102 mmol/L (ref 96–106)
Creatinine, Ser: 0.58 mg/dL (ref 0.57–1.00)
Globulin, Total: 3.2 g/dL (ref 1.5–4.5)
Glucose: 97 mg/dL (ref 70–99)
Potassium: 4.1 mmol/L (ref 3.5–5.2)
Sodium: 139 mmol/L (ref 134–144)
Total Protein: 7.2 g/dL (ref 6.0–8.5)
eGFR: 96 mL/min/{1.73_m2} (ref >59)

## 2021-08-15 LAB — CBC WITH DIFFERENTIAL/PLATELET
Basophils Absolute: 0 10*3/uL (ref 0.0–0.2)
Basos: 1 %
EOS (ABSOLUTE): 0 10*3/uL (ref 0.0–0.4)
Eos: 0 %
Hematocrit: 40.5 % (ref 34.0–46.6)
Hemoglobin: 12.8 g/dL (ref 11.1–15.9)
Immature Grans (Abs): 0 10*3/uL (ref 0.0–0.1)
Immature Granulocytes: 0 %
Lymphocytes Absolute: 1.4 10*3/uL (ref 0.7–3.1)
Lymphs: 40 %
MCH: 27.4 pg (ref 26.6–33.0)
MCHC: 31.6 g/dL (ref 31.5–35.7)
MCV: 87 fL (ref 79–97)
Monocytes Absolute: 0.3 10*3/uL (ref 0.1–0.9)
Monocytes: 9 %
Neutrophils Absolute: 1.8 10*3/uL (ref 1.4–7.0)
Neutrophils: 50 %
Platelets: 216 10*3/uL (ref 150–450)
RBC: 4.67 x10E6/uL (ref 3.77–5.28)
RDW: 14.5 % (ref 11.7–15.4)
WBC: 3.6 10*3/uL (ref 3.4–10.8)

## 2021-08-15 LAB — LIPID PANEL W/O CHOL/HDL RATIO
Cholesterol, Total: 120 mg/dL (ref 100–199)
HDL: 58 mg/dL (ref >39)
LDL Chol Calc (NIH): 49 mg/dL (ref 0–99)
Triglycerides: 62 mg/dL (ref 0–149)
VLDL Cholesterol Cal: 13 mg/dL (ref 5–40)

## 2021-08-15 LAB — VITAMIN D 25 HYDROXY (VIT D DEFICIENCY, FRACTURES): Vit D, 25-Hydroxy: 68.1 ng/mL (ref 30.0–100.0)

## 2021-08-15 NOTE — Progress Notes (Signed)
Good day, please let Shermika know all labs have returned and are perfectly stable.  No medication changes needed.  Everything looks great!!  Fantastic job!! ?Keep being amazing!!  Thank you for allowing me to participate in your care.  I appreciate you. ?Kindest regards, ?Jamas Jaquay ?

## 2021-08-19 DIAGNOSIS — R002 Palpitations: Secondary | ICD-10-CM | POA: Diagnosis not present

## 2021-08-26 ENCOUNTER — Telehealth: Payer: Self-pay | Admitting: *Deleted

## 2021-08-26 ENCOUNTER — Other Ambulatory Visit: Payer: Self-pay | Admitting: Nurse Practitioner

## 2021-08-26 NOTE — Telephone Encounter (Signed)
-----   Message from Cadence David Stall, PA-C sent at 08/25/2021  9:18 PM EDT ----- ?Heart monitor showed predominately normal rhythm, first degree AV block and rare irregular beats. Overall reassuring. If patient is still feeling palpitations can increase Toprol to 50mg  daily.  ?

## 2021-08-26 NOTE — Telephone Encounter (Signed)
Reviewed results and recommendations with patient. She denies any palpitations at this time so advised she should give [place] a call and we can adjust her medication if needed. She verbalized understanding with no further questions at this time.  ?

## 2021-08-27 NOTE — Telephone Encounter (Signed)
Requested Prescriptions  ?Pending Prescriptions Disp Refills  ?? meloxicam (MOBIC) 15 MG tablet [Pharmacy Med Name: MELOXICAM 15 MG Tablet] 90 tablet 0  ?  Sig: TAKE 1 TABLET EVERY DAY  ?  ? Analgesics:  COX2 Inhibitors Failed - 08/26/2021 12:01 PM  ?  ?  Failed - Manual Review: Labs are only required if the patient has taken medication for more than 8 weeks.  ?  ?  Passed - HGB in normal range and within 360 days  ?  Hemoglobin  ?Date Value Ref Range Status  ?08/14/2021 12.8 11.1 - 15.9 g/dL Final  ?   ?  ?  Passed - Cr in normal range and within 360 days  ?  Creatinine, Ser  ?Date Value Ref Range Status  ?08/14/2021 0.58 0.57 - 1.00 mg/dL Final  ?   ?  ?  Passed - HCT in normal range and within 360 days  ?  Hematocrit  ?Date Value Ref Range Status  ?08/14/2021 40.5 34.0 - 46.6 % Final  ?   ?  ?  Passed - AST in normal range and within 360 days  ?  AST  ?Date Value Ref Range Status  ?08/14/2021 17 0 - 40 IU/L Final  ?   ?  ?  Passed - ALT in normal range and within 360 days  ?  ALT  ?Date Value Ref Range Status  ?08/14/2021 19 0 - 32 IU/L Final  ?   ?  ?  Passed - eGFR is 30 or above and within 360 days  ?  GFR calc Af Amer  ?Date Value Ref Range Status  ?08/07/2020 102 >59 mL/min/1.73 Final  ?  Comment:  ?  **In accordance with recommendations from the NKF-ASN Task force,** ?  [facility] is in the process of updating its eGFR calculation to the ?  2021 CKD-EPI creatinine equation that estimates kidney function ?  without a race variable. ?  ? ?GFR, Estimated  ?Date Value Ref Range Status  ?11/16/2020 >60 >60 mL/min Final  ?  Comment:  ?  (NOTE) ?Calculated using the CKD-EPI Creatinine Equation (2021) ?  ? ?eGFR  ?Date Value Ref Range Status  ?08/14/2021 96 >59 mL/min/1.73 Final  ?   ?  ?  Passed - Patient is not pregnant  ?  ?  Passed - Valid encounter within last 12 months  ?  Recent Outpatient Visits   ?      ? 1 week ago Morbid obesity with BMI of 40.0-44.9, adult ([place])  ? Mont[place], [place]ito  T, NP  ? 1 month ago Primary hypertension  ? [place], [place] T, NP  ? 6 months ago Morbid obesity with BMI of 40.0-44.9, adult [facility])  ? Watson, [place] T, NP  ? 8 months ago Cerebral infarction, remote, resolved  ? [facility] [place], Whitney T, NP  ? 9 months ago Chronic tension-type headache, not intractable  ? [facility] [place], Henrine Screws T, NP  ?  ?  ?Future Appointments   ?        ? In 5 months Cannady, Barbaraann Faster, NP [employer]  ?  ? ?  ?  ?  ? ? ?

## 2021-09-05 ENCOUNTER — Ambulatory Visit: Payer: Medicare HMO | Admitting: Medical

## 2021-09-17 ENCOUNTER — Other Ambulatory Visit: Payer: Self-pay

## 2021-09-17 MED ORDER — METOPROLOL SUCCINATE ER 25 MG PO TB24: 25.0000 mg | ORAL_TABLET | Freq: Every day | ORAL | 3 refills | Status: DC

## 2021-09-17 MED ORDER — AMLODIPINE BESYLATE 5 MG PO TABS: 5.0000 mg | ORAL_TABLET | Freq: Every day | ORAL | 4 refills | Status: DC

## 2021-09-17 NOTE — Telephone Encounter (Signed)
Patient is requesting refill request via [facility] pharmacy. Please advise? ?

## 2021-10-03 ENCOUNTER — Other Ambulatory Visit: Payer: Self-pay

## 2021-10-03 ENCOUNTER — Telehealth: Payer: Self-pay | Admitting: Nurse Practitioner

## 2021-10-03 DIAGNOSIS — R195 Other fecal abnormalities: Secondary | ICD-10-CM

## 2021-10-03 MED ORDER — PEG 3350-KCL-NA BICARB-NACL 420 G PO SOLR: 4000.0000 mL | Freq: Once | ORAL | 0 refills | Status: AC

## 2021-10-03 NOTE — Telephone Encounter (Signed)
Patient was notified and verbalized understanding. Advised patient to give our office a call back if they have any questions or concerns.  ?

## 2021-10-03 NOTE — Progress Notes (Signed)
Gastroenterology Pre-Procedure Review ? ?Request Date: 10/17/21 ?Requesting Physician: Dr. Vicente Males ? ?PATIENT REVIEW QUESTIONS: The patient responded to the following health history questions as indicated:   ? ?1. Are you having any GI issues? no ?2. Do you have a personal history of Polyps? no ?3. Do you have a family history of Colon Cancer or Polyps? no ?4. Diabetes Mellitus? no ?5. Joint replacements in the past 12 months?no ?6. Major health problems in the past 3 months?no ?7. Any artificial heart valves, MVP, or defibrillator?no ?   ?MEDICATIONS & ALLERGIES:    ?Patient reports the following regarding taking any anticoagulation/antiplatelet therapy:   ?Plavix, Coumadin, Eliquis, Xarelto, Lovenox, Pradaxa, Brilinta, or Effient? no ?Aspirin? no ? ?Patient confirms/reports the following medications:  ?Current Outpatient Medications  ?Medication Sig Dispense Refill  ? albuterol (VENTOLIN HFA) 108 (90 Base) MCG/ACT inhaler Inhale 2 puffs into the lungs every 6 (six) hours as needed for wheezing or shortness of breath. 16 g 4  ? amLODipine (NORVASC) 5 MG tablet Take 1 tablet (5 mg total) by mouth daily. 90 tablet 4  ? Cholecalciferol (VITAMIN D PO) Take 1 capsule by mouth daily.     ? levothyroxine (SYNTHROID) 100 MCG tablet Take 100 mcg by mouth daily before breakfast.    ? lisinopril (ZESTRIL) 5 MG tablet TAKE 1 TABLET EVERY DAY 90 tablet 4  ? meloxicam (MOBIC) 15 MG tablet TAKE 1 TABLET EVERY DAY 90 tablet 1  ? metoprolol succinate (TOPROL-XL) 25 MG 24 hr tablet Take 1 tablet (25 mg total) by mouth daily. 90 tablet 3  ? montelukast (SINGULAIR) 10 MG tablet TAKE 1 TABLET AT BEDTIME 90 tablet 4  ? mupirocin ointment (BACTROBAN) 2 % APPLY TOPICALLY 2 TIMES DAILY 22 g 4  ? rizatriptan (MAXALT) 5 MG tablet Take 1 tablet (5 mg total) by mouth as needed for migraine. May repeat in 2 hours if needed 10 tablet 0  ? rosuvastatin (CRESTOR) 10 MG tablet Take 1 tablet (10 mg total) by mouth daily. 90 tablet 4  ? ?No current  facility-administered medications for this visit.  ? ? ?Patient confirms/reports the following allergies:  ?Allergies  ?Allergen Reactions  ? Hydrochlorothiazide Palpitations  ?  Jittery  ? ? ?No orders of the defined types were placed in this encounter. ? ? ?AUTHORIZATION INFORMATION ?Primary Insurance: ?1D#: ?Group #: ? ?Secondary Insurance: ?1D#: ?Group #: ? ?SCHEDULE INFORMATION: ?Date: 10/17/21 ?Time: ?Location: [facility] ?

## 2021-10-03 NOTE — Telephone Encounter (Signed)
[employer] FIT testing sent to office and is positive, will place referral for GI and colonoscopy.  Will have staff alert patient. ?

## 2021-10-17 ENCOUNTER — Ambulatory Visit
Admission: RE | Admit: 2021-10-17 | Discharge: 2021-10-17 | Disposition: A | Discharge status: Home / Self Care | Payer: Medicare HMO | Attending: Gastroenterology | Admitting: Gastroenterology

## 2021-10-17 ENCOUNTER — Other Ambulatory Visit: Payer: Self-pay

## 2021-10-17 ENCOUNTER — Ambulatory Visit: Payer: Medicare HMO | Admitting: Anesthesiology

## 2021-10-17 ENCOUNTER — Encounter
Admission: RE | Disposition: A | Discharge status: Home / Self Care | Payer: Self-pay | Source: Home / Self Care | Attending: Gastroenterology

## 2021-10-17 ENCOUNTER — Encounter: Payer: Self-pay | Admitting: Gastroenterology

## 2021-10-17 DIAGNOSIS — J45909 Unspecified asthma, uncomplicated: Secondary | ICD-10-CM | POA: Insufficient documentation

## 2021-10-17 DIAGNOSIS — E039 Hypothyroidism, unspecified: Secondary | ICD-10-CM | POA: Diagnosis not present

## 2021-10-17 DIAGNOSIS — Z6837 Body mass index (BMI) 37.0-37.9, adult: Secondary | ICD-10-CM | POA: Insufficient documentation

## 2021-10-17 DIAGNOSIS — Z1211 Encounter for screening for malignant neoplasm of colon: Secondary | ICD-10-CM | POA: Insufficient documentation

## 2021-10-17 DIAGNOSIS — K649 Unspecified hemorrhoids: Secondary | ICD-10-CM | POA: Diagnosis not present

## 2021-10-17 DIAGNOSIS — Z87891 Personal history of nicotine dependence: Secondary | ICD-10-CM | POA: Diagnosis not present

## 2021-10-17 DIAGNOSIS — R195 Other fecal abnormalities: Secondary | ICD-10-CM | POA: Diagnosis not present

## 2021-10-17 DIAGNOSIS — I10 Essential (primary) hypertension: Secondary | ICD-10-CM | POA: Insufficient documentation

## 2021-10-17 DIAGNOSIS — K640 First degree hemorrhoids: Secondary | ICD-10-CM | POA: Insufficient documentation

## 2021-10-17 HISTORY — PX: COLONOSCOPY WITH PROPOFOL: SHX5780

## 2021-10-17 SURGERY — COLONOSCOPY WITH PROPOFOL
Anesthesia: General

## 2021-10-17 MED ORDER — PROPOFOL 10 MG/ML IV BOLUS
INTRAVENOUS | Status: DC | PRN
  Administered 2021-10-17: 30 mg via INTRAVENOUS
  Administered 2021-10-17: 50 mg via INTRAVENOUS

## 2021-10-17 MED ORDER — SODIUM CHLORIDE 0.9 % IV SOLN: INTRAVENOUS | Status: DC

## 2021-10-17 MED ORDER — PROPOFOL 500 MG/50ML IV EMUL
INTRAVENOUS | Status: DC | PRN
Start: 2021-10-17 — End: 2021-10-17
  Administered 2021-10-17: 200 ug/kg/min via INTRAVENOUS

## 2021-10-17 NOTE — H&P (Signed)
? ? ? ?Wyline Mood, MD ?[address], [place], [place], [address] ?[address], [address], [place], [place], [address] ?Phone: [phone]  ?Fax: [phone] ? ?Primary Care Physician:  Marjie Skiff, NP ? ? ?Pre-Procedure History & Physical: ?HPI:  Jessica Brooks is a 72 y.o. female is here for an colonoscopy. ?  ?Past Medical History:  ?Diagnosis Date  ? Arthritis   ? Asthma   ? uses inhaler  ? Cataract   ? Hyperlipidemia   ? Hypertension   ? Keloid   ? ? ?Past Surgical History:  ?Procedure Laterality Date  ? ABDOMINAL HYSTERECTOMY    ? BREAST BIOPSY Right   ? needle bx years ago unsure of date  ? BREAST BIOPSY Right   ? another bx done by byrnette   ? CATARACT EXTRACTION W/PHACO Left 06/11/2017  ? Procedure: CATARACT EXTRACTION PHACO AND INTRAOCULAR LENS PLACEMENT (IOC)-LEFT;  Surgeon: Lockie Mola, MD;  Location: [facility] ORS;  Service: Ophthalmology;  Laterality: Left;  LOT # [id number] ?[place] :34.4 ?AP 15.3% ?CDE 5.29  ? KELOID EXCISION N/A   ? KELOID REMOVAL TO NAVAL IN LATE 1980's  ? MULTIPLE TOOTH EXTRACTIONS    ? ? ?Prior to Admission medications   ?Medication Sig Start Date End Date Taking? Authorizing Provider  ?amLODipine (NORVASC) 5 MG tablet Take 1 tablet (5 mg total) by mouth daily. 09/17/21  Yes Marjie Skiff, NP  ?Cholecalciferol (VITAMIN D PO) Take 1 capsule by mouth daily.    Yes [provider]  ?levothyroxine (SYNTHROID) 100 MCG tablet Take 100 mcg by mouth daily before breakfast.   Yes [provider]  ?lisinopril (ZESTRIL) 5 MG tablet TAKE 1 TABLET EVERY DAY 01/21/21  Yes Cannady, Jolene T, NP  ?meloxicam (MOBIC) 15 MG tablet TAKE 1 TABLET EVERY DAY 08/27/21  Yes Cannady, Jolene T, NP  ?metoprolol succinate (TOPROL-XL) 25 MG 24 hr tablet Take 1 tablet (25 mg total) by mouth daily. 09/17/21  Yes Cannady, Jolene T, NP  ?montelukast (SINGULAIR) 10 MG tablet TAKE 1 TABLET AT BEDTIME 01/21/21  Yes Marjie Skiff, NP  ?rosuvastatin (CRESTOR) 10 MG tablet Take 1  tablet (10 mg total) by mouth daily. 11/24/20  Yes Cannady, Jolene T, NP  ?albuterol (VENTOLIN HFA) 108 (90 Base) MCG/ACT inhaler Inhale 2 puffs into the lungs every 6 (six) hours as needed for wheezing or shortness of breath. 01/23/20   Cannady, Corrie Dandy T, NP  ?mupirocin ointment (BACTROBAN) 2 % APPLY TOPICALLY 2 TIMES DAILY 08/06/21   Aura Dials T, NP  ?rizatriptan (MAXALT) 5 MG tablet Take 1 tablet (5 mg total) by mouth as needed for migraine. May repeat in 2 hours if needed ?Patient not taking: Reported on 10/17/2021 11/13/20   Marjie Skiff, NP  ? ? ?Allergies as of 10/03/2021 - Review Complete 08/14/2021  ?Allergen Reaction Noted  ? Hydrochlorothiazide Palpitations 11/23/2017  ? ? ?Family History  ?Problem Relation Age of Onset  ? Diabetes Mother   ? Hypertension Mother   ? Cancer Father   ?     throat  ? Diabetes Sister   ? Kidney disease Sister   ? Diabetes Brother   ? Kidney disease Brother   ? Breast cancer Neg Hx   ? ? ?Social History  ? ?Socioeconomic History  ? Marital status: Divorced  ?  Spouse name: Not on file  ? Number of children: Not on file  ? Years of education: Not on file  ? Highest education level: Not  on file  ?Occupational History  ? Not on file  ?Tobacco Use  ? Smoking status: Former  ?  Types: Cigarettes  ?  Quit date: 06/16/1978  ?  Years since quitting: 43.3  ? Smokeless tobacco: Never  ?Vaping Use  ? Vaping Use: Never used  ?Substance and Sexual Activity  ? Alcohol use: No  ? Drug use: No  ? Sexual activity: Never  ?Other Topics Concern  ? Not on file  ?Social History Narrative  ? Not on file  ? ?Social Determinants of Health  ? ?Financial Resource Strain: Low Risk   ? Difficulty of Paying Living Expenses: Not hard at all  ?Food Insecurity: No Food Insecurity  ? Worried About Programme researcher, broadcasting/film/video in the Last Year: Never true  ? Ran Out of Food in the Last Year: Never true  ?Transportation Needs: No Transportation Needs  ? Lack of Transportation (Medical): No  ? Lack of Transportation  (Non-Medical): No  ?Physical Activity: Insufficiently Active  ? Days of Exercise per Week: 4 days  ? Minutes of Exercise per Session: 30 min  ?Stress: No Stress Concern Present  ? Feeling of Stress : Not at all  ?Social Connections: Moderately Isolated  ? Frequency of Communication with Friends and Family: More than three times a week  ? Frequency of Social Gatherings with Friends and Family: Three times a week  ? Attends Religious Services: 1 to 4 times per year  ? Active Member of Clubs or Organizations: No  ? Attends Banker Meetings: Never  ? Marital Status: Divorced  ?Intimate Partner Violence: Not At Risk  ? Fear of Current or Ex-Partner: No  ? Emotionally Abused: No  ? Physically Abused: No  ? Sexually Abused: No  ? ? ?Review of Systems: ?See HPI, otherwise negative ROS ? ?Physical Exam: ?BP 138/86   Pulse 97   Temp (!) 97.2 ?F (36.2 ?C) (Temporal)   Resp 20   Ht 5\' 5"  (1.651 m)   Wt 103.4 kg   SpO2 100%   BMI 37.94 kg/m?  ?General:   Alert,  pleasant and cooperative in NAD ?Head:  Normocephalic and atraumatic. ?Neck:  Supple; no masses or thyromegaly. ?Lungs:  Clear throughout to auscultation, normal respiratory effort.    ?Heart:  +S1, +S2, Regular rate and rhythm, No edema. ?Abdomen:  Soft, nontender and nondistended. Normal bowel sounds, without guarding, and without rebound.   ?Neurologic:  Alert and  oriented x4;  grossly normal neurologically. ? ?Impression/Plan: ?Jessica Brooks is here for an colonoscopy to be performed for positive fit test Risks, benefits, limitations, and alternatives regarding  colonoscopy have been reviewed with the patient.  Questions have been answered.  All parties agreeable. ? ? ? , MD  10/17/2021, 8:12 AM ? ?

## 2021-10-17 NOTE — Anesthesia Procedure Notes (Signed)
Date/Time: 10/17/2021 8:50 AM ?Performed by: Junious Silk, CRNA ?Pre-anesthesia Checklist: Patient identified, Emergency Drugs available, Suction available, Patient being monitored and Timeout performed ?Oxygen Delivery Method: Nasal cannula ? ? ? ? ?

## 2021-10-17 NOTE — Anesthesia Postprocedure Evaluation (Signed)
Anesthesia Post Note ? ?Patient: Jessica Brooks ? ?Procedure(s) Performed: COLONOSCOPY WITH PROPOFOL ? ?Patient location during evaluation: PACU ?Anesthesia Type: General ?Level of consciousness: awake and alert, oriented and patient cooperative ?Pain management: pain level controlled ?Vital Signs Assessment: post-procedure vital signs reviewed and stable ?Respiratory status: spontaneous breathing, nonlabored ventilation and respiratory function stable ?Cardiovascular status: blood pressure returned to baseline and stable ?Postop Assessment: adequate PO intake ?Anesthetic complications: no ? ? ?No notable events documented. ? ? ?Last Vitals:  ?Vitals:  ? 10/17/21 0911 10/17/21 0921  ?BP: 137/82 101/68  ?Pulse: 86 88  ?Resp:    ?Temp:    ?SpO2: 100% 100%  ?  ?Last Pain:  ?Vitals:  ? 10/17/21 0921  ?TempSrc:   ?PainSc: 0-No pain  ? ? ?  ?  ?  ?  ?  ?  ? ?Reed Breech ? ? ? ? ?

## 2021-10-17 NOTE — Anesthesia Preprocedure Evaluation (Addendum)
Anesthesia Evaluation  ?Patient identified by MRN, date of birth, ID band ?Patient awake ? ? ? ?Reviewed: ?Allergy & Precautions, NPO status , Patient's Chart, lab work & pertinent test results ? ?History of Anesthesia Complications ?Negative for: history of anesthetic complications ? ?Airway ?Mallampati: I ? ? ?Neck ROM: Full ? ? ? Dental ? ?(+) Missing ?  ?Pulmonary ?asthma , former smoker (quit 1980),  ?  ?Pulmonary exam normal ?breath sounds clear to auscultation ? ? ? ? ? ? Cardiovascular ?hypertension, Normal cardiovascular exam ?Rhythm:Regular Rate:Normal ? ?ECG 07/31/21: NSR, 76 bpm, No ST/T wave changes ?  ?Neuro/Psych ?negative neurological ROS ?   ? GI/Hepatic ?negative GI ROS,   ?Endo/Other  ?Hypothyroidism Class 3 obesity ? Renal/GU ?negative Renal ROS  ? ?  ?Musculoskeletal ? ?(+) Arthritis ,  ? Abdominal ?  ?Peds ? Hematology ?negative hematology ROS ?(+)   ?Anesthesia Other Findings ?Cardiology note 07/31/21:  ?Palpitations ?Elevated heart rate ?PCP sent patient for elevated heart rate, previously started on Toprol. Patient is unsure how high her heart rate went, said she never saw it over 100. Mostly in the 80s at home. PCP did labs that were unremarkable. EKG today shows NSR with heart rate of 76bpm. Patient reports brief palpitations. No chest pain or SOB. I will order a 7 day heart monitor. TSH normal ?? ?Hypertension ?BP good. Continue amlodipine 5mg  daily, toprol-XL 25mg  daily, lisinopril 5mg  daily ?? ?Hypothyroidism ?Recent TSH was normal.  ?? ?Disposition: Follow up in 1 month(s) with MD/APP  ? Reproductive/Obstetrics ? ?  ? ? ? ? ? ? ? ? ? ? ? ? ? ?  ?  ? ? ? ? ? ? ? ?Anesthesia Physical ?Anesthesia Plan ? ?ASA: 3 ? ?Anesthesia Plan: General  ? ?Post-op Pain Management:   ? ?Induction: Intravenous ? ?PONV Risk Score and Plan: 3 and Propofol infusion, TIVA and Treatment may vary due to age or medical condition ? ?Airway Management Planned: Natural  Airway ? ?Additional Equipment:  ? ?Intra-op Plan:  ? ?Post-operative Plan:  ? ?Informed Consent: I have reviewed the patients History and Physical, chart, labs and discussed the procedure including the risks, benefits and alternatives for the proposed anesthesia with the patient or authorized representative who has indicated his/her understanding and acceptance.  ? ? ? ? ? ?Plan Discussed with: CRNA ? ?Anesthesia Plan Comments: (LMA/GETA backup discussed.  Patient consented for risks of anesthesia including but not limited to:  ?- adverse reactions to medications ?- damage to eyes, teeth, lips or other oral mucosa ?- nerve damage due to positioning  ?- sore throat or hoarseness ?- damage to heart, brain, nerves, lungs, other parts of body or loss of life ? ?Informed patient about role of CRNA in peri- and intra-operative care.  Patient voiced understanding.)  ? ? ? ? ? ? ?Anesthesia Quick Evaluation ? ?

## 2021-10-17 NOTE — Transfer of Care (Signed)
Immediate Anesthesia Transfer of Care Note ? ?Patient: Jessica Brooks ? ?Procedure(s) Performed: COLONOSCOPY WITH PROPOFOL ? ?Patient Location: PACU ? ?Anesthesia Type:General ? ?Level of Consciousness: awake ? ?Airway & Oxygen Therapy: Patient Spontanous Breathing and Patient connected to nasal cannula oxygen ? ?Post-op Assessment: Report given to RN and Post -op Vital signs reviewed and stable ? ?Post vital signs: Reviewed and stable ? ?Last Vitals:  ?Vitals Value Taken Time  ?BP 110/74 10/17/21 0901  ?Temp 36.4 ?C 10/17/21 0901  ?Pulse 93 10/17/21 0902  ?Resp 14 10/17/21 0902  ?SpO2 100 % 10/17/21 0902  ?Vitals shown include unvalidated device data. ? ?Last Pain:  ?Vitals:  ? 10/17/21 0901  ?TempSrc: Temporal  ?PainSc: 0-No pain  ?   ? ?  ? ?Complications: No notable events documented. ?

## 2021-10-17 NOTE — Op Note (Signed)
[facility] ?Gastroenterology ?Patient Name: Jessica Brooks ?Procedure Date: 10/17/2021 8:44 AM ?MRN: [id number] ?Account #: [id number] ?Date of Birth: [birth year] ?Admit Type: Outpatient ?Age: 71 ?Room: [facility] ENDO ROOM 4 ?Gender: Female ?Note Status: Finalized ?Instrument Name: Colonoscope [id number] ?Procedure:             Colonoscopy ?Indications:           Positive fecal immunochemical test ?Providers:             Wyline Mood MD, MD ?Referring MD:          Dorie Rank. Harvest Dark (Referring MD) ?Medicines:             Monitored Anesthesia Care ?Complications:         No immediate complications. ?Procedure:             Pre-Anesthesia Assessment: ?                       - Prior to the procedure, a History and Physical was  ?                       performed, and patient medications, allergies and  ?                       sensitivities were reviewed. The patient's tolerance  ?                       of previous anesthesia was reviewed. ?                       - The risks and benefits of the procedure and the  ?                       sedation options and risks were discussed with the  ?                       patient. All questions were answered and informed  ?                       consent was obtained. ?                       - ASA Grade Assessment: II - A patient with mild  ?                       systemic disease. ?                       After obtaining informed consent, the colonoscope was  ?                       passed under direct vision. Throughout the procedure,  ?                       the patient's blood pressure, pulse, and oxygen  ?                       saturations were monitored continuously. The  ?                       Colonoscope was introduced through  the anus and  ?                       advanced to the the cecum, identified by the  ?                       appendiceal orifice. The colonoscopy was performed  ?                       with ease. The patient tolerated the procedure well.  ?                        The quality of the bowel preparation was adequate. ?Findings: ?     The perianal and digital rectal examinations were normal. ?     Non-bleeding internal hemorrhoids were found during retroflexion. The  ?     hemorrhoids were large and Grade I (internal hemorrhoids that do not  ?     prolapse). ?     The exam was otherwise without abnormality on direct and retroflexion  ?     views. ?Impression:            - Non-bleeding internal hemorrhoids. ?                       - The examination was otherwise normal on direct and  ?                       retroflexion views. ?                       - No specimens collected. ?Recommendation:        - Discharge patient to home (with escort). ?                       - Resume previous diet. ?                       - Continue present medications. ?                       - Repeat colonoscopy in 10 years for screening  ?                       purposes. ?Procedure Code(s):     --- Professional --- ?                       [phone], Colonoscopy, flexible; diagnostic, including  ?                       collection of specimen(s) by brushing or washing, when  ?                       performed (separate procedure) ?Diagnosis Code(s):     --- Professional --- ?                       K64.0, First degree hemorrhoids ?                       R19.5, Other fecal abnormalities ?CPT copyright 2019 American Medical Association. All rights reserved. ?The codes documented  in this report are preliminary and upon coder review may  ?be revised to meet current compliance requirements. ?Wyline Mood, MD ?Wyline Mood MD, MD ?10/17/2021 9:00:21 AM ?This report has been signed electronically. ?Number of Addenda: 0 ?Note Initiated On: 10/17/2021 8:44 AM ?Scope Withdrawal Time: 0 hours 7 minutes 38 seconds  ?Total Procedure Duration: 0 hours 9 minutes 27 seconds  ?Estimated Blood Loss:  Estimated blood loss: none. ?     [facility] ?

## 2021-10-18 ENCOUNTER — Encounter: Payer: Self-pay | Admitting: Gastroenterology

## 2021-12-02 ENCOUNTER — Other Ambulatory Visit: Payer: Self-pay | Admitting: Nurse Practitioner

## 2021-12-03 NOTE — Telephone Encounter (Signed)
Requested medication (s) are due for refill today: expired medication  Requested medication (s) are on the active medication list: yes  Last refill:  11/24/20 #90 4 refills  Future visit scheduled: yes in 2 months  Notes to clinic:  expired medication. Do you want to renew Rx?     Requested Prescriptions  Pending Prescriptions Disp Refills   rosuvastatin (CRESTOR) 10 MG tablet [Pharmacy Med Name: ROSUVASTATIN CALCIUM 10 MG Tablet] 90 tablet 4    Sig: TAKE 1 TABLET EVERY DAY     Cardiovascular:  Antilipid - Statins 2 Failed - 12/02/2021  1:23 PM      Failed - Lipid Panel in normal range within the last 12 months    Cholesterol, Total  Date Value Ref Range Status  08/14/2021 120 100 - 199 mg/dL Final   Cholesterol Piccolo, Waived  Date Value Ref Range Status  04/29/2019 166 <200 mg/dL Final    Comment:                            Desirable                <200                         Borderline High      200- 239                         High                     >239    LDL Chol Calc (NIH)  Date Value Ref Range Status  08/14/2021 49 0 - 99 mg/dL Final   HDL  Date Value Ref Range Status  08/14/2021 58 >39 mg/dL Final   Triglycerides  Date Value Ref Range Status  08/14/2021 62 0 - 149 mg/dL Final   Triglycerides Piccolo,Waived  Date Value Ref Range Status  04/29/2019 63 <150 mg/dL Final    Comment:                            Normal                   <150                         Borderline High     150 - 199                         High                200 - 499                         Very High                >499          Passed - Cr in normal range and within 360 days    Creatinine, Ser  Date Value Ref Range Status  08/14/2021 0.58 0.57 - 1.00 mg/dL Final         Passed - Patient is not pregnant      Passed - Valid encounter within last 12 months    Recent Outpatient Visits  3 months ago Morbid obesity with BMI of 40.0-44.9, adult (HCC)    [facility] Cannady, Dorie Rank, NP   4 months ago Primary hypertension   [facility] [place], [place] T, NP   10 months ago Morbid obesity with BMI of 40.0-44.9, adult (HCC)   [facility] Cannady, Jolene T, NP   11 months ago Cerebral infarction, remote, resolved   [facility] [place], [place] T, NP   1 year ago Chronic tension-type headache, not intractable   [facility] [place], Dorie Rank, NP       Future Appointments             In 2 months Cannady, Dorie Rank, NP [facility]

## 2022-01-20 DIAGNOSIS — E890 Postprocedural hypothyroidism: Secondary | ICD-10-CM | POA: Diagnosis not present

## 2022-01-21 ENCOUNTER — Other Ambulatory Visit: Payer: Self-pay | Admitting: Nurse Practitioner

## 2022-01-21 NOTE — Telephone Encounter (Signed)
Requested medications are due for refill today.  Unsure  Requested medications are on the active medications list.  yes  Last refill. 08/06/2021 22 g  refills  Future visit scheduled.   yes  Notes to clinic.  Refill not delegated    Requested Prescriptions  Pending Prescriptions Disp Refills   mupirocin ointment (BACTROBAN) 2 % [Pharmacy Med Name: MUPIROCIN 2 % Ointment] 22 g 4    Sig: APPLY TOPICALLY 2 TIMES DAILY     Off-Protocol Failed - 01/21/2022  1:54 PM      Failed - Medication not assigned to a protocol, review manually.      Passed - Valid encounter within last 12 months    Recent Outpatient Visits           5 months ago Morbid obesity with BMI of 40.0-44.9, adult (HCC)   [facility] Cannady, Dorie Rank, NP   6 months ago Primary hypertension   [facility] [place], [place] T, NP   11 months ago Morbid obesity with BMI of 40.0-44.9, adult (HCC)   [facility] Cannady, Dorie Rank, NP   1 year ago Cerebral infarction, remote, resolved   [facility] [place], [place] T, NP   1 year ago Chronic tension-type headache, not intractable   [facility] [place], Dorie Rank, NP       Future Appointments             In 3 weeks Cannady, Dorie Rank, NP [facility]

## 2022-01-27 ENCOUNTER — Other Ambulatory Visit: Payer: Self-pay | Admitting: Nurse Practitioner

## 2022-01-28 NOTE — Telephone Encounter (Signed)
Requested Prescriptions  Pending Prescriptions Disp Refills  . montelukast (SINGULAIR) 10 MG tablet [Pharmacy Med Name: MONTELUKAST SODIUM 10 MG Tablet] 90 tablet 4    Sig: TAKE 1 TABLET AT BEDTIME     Pulmonology:  Leukotriene Inhibitors Passed - 01/27/2022 10:29 AM      Passed - Valid encounter within last 12 months    Recent Outpatient Visits          5 months ago Morbid obesity with BMI of 40.0-44.9, adult (HCC)   [facility] Cannady, Dorie Rank, NP   6 months ago Primary hypertension   [facility] [place], [place] T, NP   11 months ago Morbid obesity with BMI of 40.0-44.9, adult (HCC)   [facility] Cannady, Jolene T, NP   1 year ago Cerebral infarction, remote, resolved   [facility] [place], [place] T, NP   1 year ago Chronic tension-type headache, not intractable   [facility] [place], Dorie Rank, NP      Future Appointments            In 2 weeks Cannady, Dorie Rank, NP [facility]

## 2022-02-08 DIAGNOSIS — E890 Postprocedural hypothyroidism: Secondary | ICD-10-CM | POA: Insufficient documentation

## 2022-02-08 DIAGNOSIS — I440 Atrioventricular block, first degree: Secondary | ICD-10-CM | POA: Insufficient documentation

## 2022-02-10 ENCOUNTER — Other Ambulatory Visit: Payer: Self-pay | Admitting: Nurse Practitioner

## 2022-02-11 NOTE — Telephone Encounter (Signed)
last RF 01/21/21 #90 4 RF  Requested Prescriptions  Refused Prescriptions Disp Refills  . lisinopril (ZESTRIL) 5 MG tablet [Pharmacy Med Name: LISINOPRIL 5 MG Tablet] 90 tablet 4    Sig: TAKE 1 TABLET EVERY DAY     Cardiovascular:  ACE Inhibitors Passed - 02/10/2022 10:28 AM      Passed - Cr in normal range and within 180 days    Creatinine, Ser  Date Value Ref Range Status  08/14/2021 0.58 0.57 - 1.00 mg/dL Final         Passed - K in normal range and within 180 days    Potassium  Date Value Ref Range Status  08/14/2021 4.1 3.5 - 5.2 mmol/L Final         Passed - Patient is not pregnant      Passed - Last BP in normal range    BP Readings from Last 1 Encounters:  10/17/21 101/68         Passed - Valid encounter within last 6 months    Recent Outpatient Visits          6 months ago Morbid obesity with BMI of 40.0-44.9, adult (HCC)   [facility] Cannady, Dorie Rank, NP   6 months ago Primary hypertension   [facility] [place], [place] T, NP   1 year ago Morbid obesity with BMI of 40.0-44.9, adult (HCC)   [facility] Cannady, Jolene T, NP   1 year ago Cerebral infarction, remote, resolved   [facility] [place], Elizabeth T, NP   1 year ago Chronic tension-type headache, not intractable   [facility] [place], Dorie Rank, NP      Future Appointments            In 3 days Cannady, Dorie Rank, NP [facility]

## 2022-02-14 ENCOUNTER — Ambulatory Visit: Payer: Medicare HMO | Admitting: Nurse Practitioner

## 2022-02-14 DIAGNOSIS — E782 Mixed hyperlipidemia: Secondary | ICD-10-CM

## 2022-02-14 DIAGNOSIS — I10 Essential (primary) hypertension: Secondary | ICD-10-CM

## 2022-02-14 DIAGNOSIS — I440 Atrioventricular block, first degree: Secondary | ICD-10-CM

## 2022-02-14 DIAGNOSIS — E890 Postprocedural hypothyroidism: Secondary | ICD-10-CM

## 2022-02-14 DIAGNOSIS — R002 Palpitations: Secondary | ICD-10-CM

## 2022-02-14 DIAGNOSIS — J4520 Mild intermittent asthma, uncomplicated: Secondary | ICD-10-CM

## 2022-03-04 ENCOUNTER — Ambulatory Visit (INDEPENDENT_AMBULATORY_CARE_PROVIDER_SITE_OTHER): Payer: Medicare HMO | Admitting: *Deleted

## 2022-03-04 DIAGNOSIS — Z0000 Encounter for general adult medical examination without abnormal findings: Secondary | ICD-10-CM | POA: Diagnosis not present

## 2022-03-04 NOTE — Progress Notes (Signed)
Subjective:   Jessica Brooks is a 71 y.o. female who presents for Medicare Annual (Subsequent) preventive examination.  I connected with  Yeng S Casseus on 03/04/22 by a telephone  enabled telemedicine application and verified that I am speaking with the correct person using two identifiers.   I discussed the limitations of evaluation and management by telemedicine. The patient expressed understanding and agreed to proceed.  Patient location: home  Provider location: Tele-health-home    Review of Systems     Cardiac Risk Factors include: advanced age (>[id number], 46>65 women);hypertension;obesity (BMI >30kg/m2)     Objective:    There were no vitals filed for this visit. There is no height or weight on file to calculate BMI.     03/04/2022   11:37 AM 10/17/2021    7:53 AM 02/27/2021    6:06 PM 11/16/2020    7:10 PM 04/23/2020   12:23 PM 06/11/2017    6:26 AM 04/08/2017   10:34 AM  Advanced Directives  Does Patient Have a Medical Advance Directive? No Yes No No No Yes Yes  Type of Furniture conservator/restorerAdvance Directive  Healthcare Power of Attorney;Living will    Healthcare Power of Attorney   Does patient want to make changes to medical advance directive?      No - Patient declined Yes (MAU/Ambulatory/Procedural Areas - Information given)  Copy of Healthcare Power of Attorney in Chart?  No - copy requested    No - copy requested   Would patient like information on creating a medical advance directive? No - Patient declined  No - Patient declined  No - Patient declined      Current Medications (verified) Outpatient Encounter Medications as of 03/04/2022  Medication Sig   albuterol (VENTOLIN HFA) 108 (90 Base) MCG/ACT inhaler Inhale 2 puffs into the lungs every 6 (six) hours as needed for wheezing or shortness of breath.   amLODipine (NORVASC) 5 MG tablet Take 1 tablet (5 mg total) by mouth daily.   Cholecalciferol (VITAMIN D PO) Take 1 capsule by mouth daily.    levothyroxine (SYNTHROID)  100 MCG tablet Take 100 mcg by mouth daily before breakfast.   lisinopril (ZESTRIL) 5 MG tablet TAKE 1 TABLET EVERY DAY   meloxicam (MOBIC) 15 MG tablet TAKE 1 TABLET EVERY DAY   metoprolol succinate (TOPROL-XL) 25 MG 24 hr tablet Take 1 tablet (25 mg total) by mouth daily.   montelukast (SINGULAIR) 10 MG tablet TAKE 1 TABLET AT BEDTIME   mupirocin ointment (BACTROBAN) 2 % APPLY TOPICALLY 2 TIMES DAILY   rosuvastatin (CRESTOR) 10 MG tablet TAKE 1 TABLET EVERY DAY   No facility-administered encounter medications on file as of 03/04/2022.    Allergies (verified) Hydrochlorothiazide   History: Past Medical History:  Diagnosis Date   Arthritis    Asthma    uses inhaler   Cataract    Hyperlipidemia    Hypertension    Keloid    Past Surgical History:  Procedure Laterality Date   ABDOMINAL HYSTERECTOMY     BREAST BIOPSY Right    needle bx years ago unsure of date   BREAST BIOPSY Right    another bx done by [facility]    CATARACT EXTRACTION W/PHACO Left 06/11/2017   Procedure: CATARACT EXTRACTION PHACO AND INTRAOCULAR LENS PLACEMENT (IOC)-LEFT;  Surgeon: Lockie MolaBrasington, Chadwick, MD;  Location: [facility] ORS;  Service: Ophthalmology;  Laterality: Left;  LOT # [id number] H US :34.4 AP 15.3% CDE 5.29   COLONOSCOPY WITH PROPOFOL N/A 10/17/2021   Procedure: COLONOSCOPY  WITH PROPOFOL;  Surgeon: Wyline Mood, MD;  Location: [facility];  Service: Gastroenterology;  Laterality: N/A;   KELOID EXCISION N/A    KELOID REMOVAL TO NAVAL IN LATE 1980's   MULTIPLE TOOTH EXTRACTIONS     Family History  Problem Relation Age of Onset   Diabetes Mother    Hypertension Mother    Cancer Father        throat   Diabetes Sister    Kidney disease Sister    Diabetes Brother    Kidney disease Brother    Breast cancer Neg Hx    Social History   Socioeconomic History   Marital status: Divorced    Spouse name: Not on file   Number of children: Not on file   Years of education: Not on file   Highest education  level: Not on file  Occupational History   Not on file  Tobacco Use   Smoking status: Former    Types: Cigarettes    Quit date: 06/16/1978    Years since quitting: 43.7   Smokeless tobacco: Never  Vaping Use   Vaping Use: Never used  Substance and Sexual Activity   Alcohol use: No   Drug use: No   Sexual activity: Never  Other Topics Concern   Not on file  Social History Narrative   Not on file   Social Determinants of Health   Financial Resource Strain: Low Risk  (03/04/2022)   Overall Financial Resource Strain (CARDIA)    Difficulty of Paying Living Expenses: Not hard at all  Food Insecurity: No Food Insecurity (03/04/2022)   Hunger Vital Sign    Worried About Running Out of Food in the Last Year: Never true    Ran Out of Food in the Last Year: Never true  Transportation Needs: No Transportation Needs (03/04/2022)   PRAPARE - Administrator, Civil Service (Medical): No    Lack of Transportation (Non-Medical): No  Physical Activity: Insufficiently Active (03/04/2022)   Exercise Vital Sign    Days of Exercise per Week: 3 days    Minutes of Exercise per Session: 30 min  Stress: No Stress Concern Present (03/04/2022)   [employer] of Occupational Health - Occupational Stress Questionnaire    Feeling of Stress : Only a little  Social Connections: Moderately Integrated (03/04/2022)   Social Connection and Isolation Panel [NHANES]    Frequency of Communication with Friends and Family: More than three times a week    Frequency of Social Gatherings with Friends and Family: Once a week    Attends Religious Services: More than 4 times per year    Active Member of [employer] or Organizations: Yes    Attends Engineer, structural: More than 4 times per year    Marital Status: Divorced    Tobacco Counseling Counseling given: Not Answered   Clinical Intake:  Pre-visit preparation completed: Yes  Pain : No/denies pain     Diabetes: No     Diabetic?   no  Interpreter Needed?: No  Information entered by :: Remi Haggard  LPN   Activities of Daily Living    03/04/2022   11:42 AM  In your present state of health, do you have any difficulty performing the following activities:  Hearing? 0  Vision? 0  Difficulty concentrating or making decisions? 0  Walking or climbing stairs? 1  Dressing or bathing? 0  Doing errands, shopping? 0  Preparing Food and eating ? N  Using the Toilet? N  In the past six months, have you accidently leaked urine? N  Do you have problems with loss of bowel control? N  Managing your Medications? N  Managing your Finances? N  Housekeeping or managing your Housekeeping? N    Patient Care Team: Marjie Skiff, NP as PCP - General (Nurse Practitioner) Debbe Odea, MD as Consulting Physician (Cardiology)  Indicate any recent Medical Services you may have received from other than [facility] providers in the past year (date may be approximate).     Assessment:   This is a routine wellness examination for Kane.  Hearing/Vision screen Hearing Screening - Comments:: No trouble hearing Vision Screening - Comments:: Not up to date Appointment in [facility]  Dietary issues and exercise activities discussed: Current Exercise Habits: Home exercise routine, Type of exercise: treadmill, Time (Minutes): 30, Frequency (Times/Week): 3, Weekly Exercise (Minutes/Week): 90, Intensity: Mild   Goals Addressed             This Visit's Progress    Increase physical activity   Not on track    Increase walking to 3x a week for at least 20 minutes     Weight (lb) < 200 lb (90.7 kg)         Depression Screen    03/04/2022   11:44 AM 08/14/2021    9:56 AM 07/24/2021   10:28 AM 02/27/2021    6:01 PM 08/07/2020   10:40 AM 07/12/2019    8:05 AM 04/06/2019    3:36 PM  PHQ 2/9 Scores  PHQ - 2 Score 0 0 2 0 0 0 0  PHQ- 9 Score 3 2 8         Fall Risk    03/04/2022   11:37 AM 07/24/2021   10:27 AM 02/27/2021     6:06 PM 12/11/2020    2:27 PM 07/12/2019    8:05 AM  Fall Risk   Falls in the past year? 0 0 0 0 0  Number falls in past yr: 0 0 0 0 0  Injury with Fall? 0 0 0 0 0  Risk for fall due to : Impaired mobility No Fall Risks No Fall Risks No Fall Risks   Follow up Falls evaluation completed;Education provided;Falls prevention discussed Falls evaluation completed Falls evaluation completed Falls evaluation completed Falls evaluation completed    FALL RISK PREVENTION PERTAINING TO THE HOME:  Any stairs in or around the home? No  If so, are there any without handrails? No  Home free of loose throw rugs in walkways, pet beds, electrical cords, etc? Yes  Adequate lighting in your home to reduce risk of falls? Yes   ASSISTIVE DEVICES UTILIZED TO PREVENT FALLS:  Life alert? No  Use of a cane, walker or w/c? No  Grab bars in the bathroom? No  Shower chair or bench in shower? No  Elevated toilet seat or a handicapped toilet? Yes   TIMED UP AND GO:  Was the test performed? No .    Cognitive Function:        03/04/2022   11:39 AM 02/27/2021    6:08 PM 04/08/2017   10:38 AM  6CIT Screen  What Year? 0 points 0 points 0 points  What month? 0 points 0 points 0 points  What time? 0 points 0 points 0 points  Count back from 20 0 points 0 points 0 points  Months in reverse 0 points 0 points 0 points  Repeat phrase 2 points 0 points 0 points  Total Score 2 points 0 points 0 points    Immunizations Immunization History  Administered Date(s) Administered   Fluad Quad(high Dose 65+) 03/14/2019, 03/13/2020   Influenza, High Dose Seasonal PF 04/08/2017, 04/02/2018, 04/05/2021   Influenza-Unspecified 03/24/2016   Moderna Covid-19 Vaccine Bivalent Booster 30yrs & up 05/03/2021   PFIZER(Purple Top)SARS-COV-2 Vaccination 07/28/2019, 08/20/2019, 03/21/2020, 01/05/2021   Pneumococcal Conjugate-13 12/14/2015   Pneumococcal Polysaccharide-23 04/08/2017   Tdap 11/20/2015   Zoster Recombinat  (Shingrix) 12/22/2020, 02/02/2021   Zoster, Live 11/20/2015    TDAP status: Up to date  Flu Vaccine status: Due, Education has been provided regarding the importance of this vaccine. Advised may receive this vaccine at local pharmacy or Health Dept. Aware to provide a copy of the vaccination record if obtained from local pharmacy or Health Dept. Verbalized acceptance and understanding.  Pneumococcal vaccine status: Up to date  Covid-19 vaccine status: Information provided on how to obtain vaccines.   Qualifies for Shingles Vaccine? No   Zostavax completed Yes   Shingrix Completed?: Yes  Screening Tests Health Maintenance  Topic Date Due   COVID-19 Vaccine (6 - Pfizer series) 08/31/2021   INFLUENZA VACCINE  01/14/2022   MAMMOGRAM  05/07/2023   TETANUS/TDAP  11/19/2025   DEXA SCAN  05/06/2026   COLONOSCOPY (Pts 45-39yrs Insurance coverage will need to be confirmed)  10/18/2031   Pneumonia Vaccine 95+ Years old  Completed   Hepatitis C Screening  Completed   Zoster Vaccines- Shingrix  Completed   HPV VACCINES  Aged Out   Fecal DNA (Cologuard)  Discontinued    Health Maintenance  Health Maintenance Due  Topic Date Due   COVID-19 Vaccine (6 - Pfizer series) 08/31/2021   INFLUENZA VACCINE  01/14/2022    Colorectal cancer screening: Type of screening: Colonoscopy. Completed 2023. Repeat every 10 years  Mammogram status: Completed  . Repeat every year  Bone Density status: Completed 2022. Results reflect: Bone density results: OSTEOPENIA. Repeat every 5 years.  Lung Cancer Screening: (Low Dose CT Chest recommended if Age 53-80 years, 30 pack-year currently smoking OR have quit w/in 15years.) does not qualify.   Lung Cancer Screening Referral:   Additional Screening:  Hepatitis C Screening: does not qualify; Completed 2017  Vision Screening: Recommended annual ophthalmology exams for early detection of glaucoma and other disorders of the eye. Is the patient up to date  with their annual eye exam?  Yes  Who is the provider or what is the name of the office in which the patient attends annual eye exams? [facility] If pt is not established with a provider, would they like to be referred to a provider to establish care? No .   Dental Screening: Recommended annual dental exams for proper oral hygiene  Community Resource Referral / Chronic Care Management: CRR required this visit?  No   CCM required this visit?  No      Plan:     I have personally reviewed and noted the following in the patient's chart:   Medical and social history Use of alcohol, tobacco or illicit drugs  Current medications and supplements including opioid prescriptions. Patient is not currently taking opioid prescriptions. Functional ability and status Nutritional status Physical activity Advanced directives List of other physicians Hospitalizations, surgeries, and ER visits in previous 12 months Vitals Screenings to include cognitive, depression, and falls Referrals and appointments  In addition, I have reviewed and discussed with patient certain preventive protocols, quality metrics, and best practice recommendations. A written personalized care plan  for preventive services as well as general preventive health recommendations were provided to patient.     Remi Haggard, LPN   10/22/3265   Nurse Notes:

## 2022-03-04 NOTE — Patient Instructions (Signed)
[name] , Thank you for taking time to come for your Medicare Wellness Visit. I appreciate your ongoing commitment to your health goals. Please review the following plan we discussed and let me know if I can assist you in the future.   Screening recommendations/referrals: Colonoscopy: up to date Mammogram: up to date Bone Density: up to date Recommended yearly ophthalmology/optometry visit for glaucoma screening and checkup Recommended yearly dental visit for hygiene and checkup  Vaccinations: Influenza vaccine: Education provided Pneumococcal vaccine: up to date Tdap vaccine: up to date Shingles vaccine: up to date    Advanced directives: Education provided    Preventive Care 57 Years and Older, Female Preventive care refers to lifestyle choices and visits with your health care provider that can promote health and wellness. What does preventive care include? A yearly physical exam. This is also called an annual well check. Dental exams once or twice a year. Routine eye exams. Ask your health care provider how often you should have your eyes checked. Personal lifestyle choices, including: Daily care of your teeth and gums. Regular physical activity. Eating a healthy diet. Avoiding tobacco and drug use. Limiting alcohol use. Practicing safe sex. Taking low-dose aspirin every day. Taking vitamin and mineral supplements as recommended by your health care provider. What happens during an annual well check? The services and screenings done by your health care provider during your annual well check will depend on your age, overall health, lifestyle risk factors, and family history of disease. Counseling  Your health care provider may ask you questions about your: Alcohol use. Tobacco use. Drug use. Emotional well-being. Home and relationship well-being. Sexual activity. Eating habits. History of falls. Memory and ability to understand (cognition). Work and work  Statistician. Reproductive health. Screening  You may have the following tests or measurements: Height, weight, and BMI. Blood pressure. Lipid and cholesterol levels. These may be checked every 5 years, or more frequently if you are over 85 years old. Skin check. Lung cancer screening. You may have this screening every year starting at age 44 if you have a 30-pack-year history of smoking and currently smoke or have quit within the past 15 years. Fecal occult blood test (FOBT) of the stool. You may have this test every year starting at age 40. Flexible sigmoidoscopy or colonoscopy. You may have a sigmoidoscopy every 5 years or a colonoscopy every 10 years starting at age 63. Hepatitis C blood test. Hepatitis B blood test. Sexually transmitted disease (STD) testing. Diabetes screening. This is done by checking your blood sugar (glucose) after you have not eaten for a while (fasting). You may have this done every 1-3 years. Bone density scan. This is done to screen for osteoporosis. You may have this done starting at age 3. Mammogram. This may be done every 1-2 years. Talk to your health care provider about how often you should have regular mammograms. Talk with your health care provider about your test results, treatment options, and if necessary, the need for more tests. Vaccines  Your health care provider may recommend certain vaccines, such as: Influenza vaccine. This is recommended every year. Tetanus, diphtheria, and acellular pertussis (Tdap, Td) vaccine. You may need a Td booster every 10 years. Zoster vaccine. You may need this after age 25. Pneumococcal 13-valent conjugate (PCV13) vaccine. One dose is recommended after age 65. Pneumococcal polysaccharide (PPSV23) vaccine. One dose is recommended after age 6. Talk to your health care provider about which screenings and vaccines you need and how often  you need them. This information is not intended to replace advice given to you by  your health care provider. Make sure you discuss any questions you have with your health care provider. Document Released: 06/29/2015 Document Revised: 02/20/2016 Document Reviewed: 04/03/2015 Elsevier Interactive Patient Education  2017 [place] Prevention in the Home Falls can cause injuries. They can happen to people of all ages. There are many things you can do to make your home safe and to help prevent falls. What can I do on the outside of my home? Regularly fix the edges of walkways and driveways and fix any cracks. Remove anything that might make you trip as you walk through a door, such as a raised step or threshold. Trim any bushes or trees on the path to your home. Use bright outdoor lighting. Clear any walking paths of anything that might make someone trip, such as rocks or tools. Regularly check to see if handrails are loose or broken. Make sure that both sides of any steps have handrails. Any raised decks and porches should have guardrails on the edges. Have any leaves, snow, or ice cleared regularly. Use sand or salt on walking paths during winter. Clean up any spills in your garage right away. This includes oil or grease spills. What can I do in the bathroom? Use night lights. Install grab bars by the toilet and in the tub and shower. Do not use towel bars as grab bars. Use non-skid mats or decals in the tub or shower. If you need to sit down in the shower, use a plastic, non-slip stool. Keep the floor dry. Clean up any water that spills on the floor as soon as it happens. Remove soap buildup in the tub or shower regularly. Attach bath mats securely with double-sided non-slip rug tape. Do not have throw rugs and other things on the floor that can make you trip. What can I do in the bedroom? Use night lights. Make sure that you have a light by your bed that is easy to reach. Do not use any sheets or blankets that are too big for your bed. They should not hang  down onto the floor. Have a firm chair that has side arms. You can use this for support while you get dressed. Do not have throw rugs and other things on the floor that can make you trip. What can I do in the kitchen? Clean up any spills right away. Avoid walking on wet floors. Keep items that you use a lot in easy-to-reach places. If you need to reach something above you, use a strong step stool that has a grab bar. Keep electrical cords out of the way. Do not use floor polish or wax that makes floors slippery. If you must use wax, use non-skid floor wax. Do not have throw rugs and other things on the floor that can make you trip. What can I do with my stairs? Do not leave any items on the stairs. Make sure that there are handrails on both sides of the stairs and use them. Fix handrails that are broken or loose. Make sure that handrails are as long as the stairways. Check any carpeting to make sure that it is firmly attached to the stairs. Fix any carpet that is loose or worn. Avoid having throw rugs at the top or bottom of the stairs. If you do have throw rugs, attach them to the floor with carpet tape. Make sure that you have a light  switch at the top of the stairs and the bottom of the stairs. If you do not have them, ask someone to add them for you. What else can I do to help prevent falls? Wear shoes that: Do not have high heels. Have rubber bottoms. Are comfortable and fit you well. Are closed at the toe. Do not wear sandals. If you use a stepladder: Make sure that it is fully opened. Do not climb a closed stepladder. Make sure that both sides of the stepladder are locked into place. Ask someone to hold it for you, if possible. Clearly mark and make sure that you can see: Any grab bars or handrails. First and last steps. Where the edge of each step is. Use tools that help you move around (mobility aids) if they are needed. These  include: Canes. Walkers. Scooters. Crutches. Turn on the lights when you go into a dark area. Replace any light bulbs as soon as they burn out. Set up your furniture so you have a clear path. Avoid moving your furniture around. If any of your floors are uneven, fix them. If there are any pets around you, be aware of where they are. Review your medicines with your doctor. Some medicines can make you feel dizzy. This can increase your chance of falling. Ask your doctor what other things that you can do to help prevent falls. This information is not intended to replace advice given to you by your health care provider. Make sure you discuss any questions you have with your health care provider. Document Released: 03/29/2009 Document Revised: 11/08/2015 Document Reviewed: 07/07/2014 Elsevier Interactive Patient Education  2017 [employer].

## 2022-03-11 ENCOUNTER — Ambulatory Visit: Payer: Medicare HMO | Admitting: Nurse Practitioner

## 2022-03-11 DIAGNOSIS — E890 Postprocedural hypothyroidism: Secondary | ICD-10-CM

## 2022-03-11 DIAGNOSIS — I440 Atrioventricular block, first degree: Secondary | ICD-10-CM

## 2022-03-11 DIAGNOSIS — J4520 Mild intermittent asthma, uncomplicated: Secondary | ICD-10-CM

## 2022-03-11 DIAGNOSIS — E782 Mixed hyperlipidemia: Secondary | ICD-10-CM

## 2022-03-11 DIAGNOSIS — I10 Essential (primary) hypertension: Secondary | ICD-10-CM

## 2022-03-21 DIAGNOSIS — H40003 Preglaucoma, unspecified, bilateral: Secondary | ICD-10-CM | POA: Diagnosis not present

## 2022-03-21 DIAGNOSIS — H524 Presbyopia: Secondary | ICD-10-CM | POA: Diagnosis not present

## 2022-03-23 NOTE — Patient Instructions (Signed)

## 2022-03-28 ENCOUNTER — Ambulatory Visit (INDEPENDENT_AMBULATORY_CARE_PROVIDER_SITE_OTHER): Payer: Medicare HMO | Admitting: Nurse Practitioner

## 2022-03-28 ENCOUNTER — Encounter: Payer: Self-pay | Admitting: Nurse Practitioner

## 2022-03-28 VITALS — BP 128/78 | HR 78 | Temp 97.8°F | Ht 65.0 in | Wt 242.0 lb

## 2022-03-28 DIAGNOSIS — E890 Postprocedural hypothyroidism: Secondary | ICD-10-CM

## 2022-03-28 DIAGNOSIS — Z6841 Body Mass Index (BMI) 40.0 and over, adult: Secondary | ICD-10-CM

## 2022-03-28 DIAGNOSIS — J4520 Mild intermittent asthma, uncomplicated: Secondary | ICD-10-CM

## 2022-03-28 DIAGNOSIS — R7301 Impaired fasting glucose: Secondary | ICD-10-CM

## 2022-03-28 DIAGNOSIS — E782 Mixed hyperlipidemia: Secondary | ICD-10-CM | POA: Diagnosis not present

## 2022-03-28 DIAGNOSIS — I440 Atrioventricular block, first degree: Secondary | ICD-10-CM

## 2022-03-28 DIAGNOSIS — M170 Bilateral primary osteoarthritis of knee: Secondary | ICD-10-CM

## 2022-03-28 DIAGNOSIS — Z23 Encounter for immunization: Secondary | ICD-10-CM

## 2022-03-28 DIAGNOSIS — I10 Essential (primary) hypertension: Secondary | ICD-10-CM | POA: Diagnosis not present

## 2022-03-28 DIAGNOSIS — M85851 Other specified disorders of bone density and structure, right thigh: Secondary | ICD-10-CM | POA: Diagnosis not present

## 2022-03-28 NOTE — Assessment & Plan Note (Signed)
Stable, seen by cardiology 07/31/21 and tolerating Metoprolol.  Continue this medication regimen and adjust as needed.

## 2022-03-28 NOTE — Progress Notes (Signed)
BP 128/78 (BP Location: Left Arm, Patient Position: Sitting, Cuff Size: Large)   Pulse 78   Temp 97.8 F (36.6 C) (Oral)   Ht _0  (1.651 m)   Wt 242 lb (109.8 kg)   SpO2 99%   BMI 40.27 kg/m    Subjective:    Patient ID: Jessica Brooks, female    DOB: 1951/05/11, 71 y.o.   MRN: [id number]  HPI: Jessica Brooks is a 71 y.o. female  Chief Complaint  Patient presents with   Hyperlipidemia   Hypertension   Asthma   Hypothyroidism   Knee Pain    Patient says she having some bilateral pain in her knees. Patient says she has had injections in the past, but says the pain seems to be getting worse. Patient says the pain in her knees are when she having to get up is when she feels the most discomfort. Patient says when she walks certain distances her knees start to hurt.    HYPERTENSION / HYPERLIPIDEMIA Continues on Lisinopril, Metoprolol, Amlodipine, Crestor.  Last saw cardiology 07/31/21 for 1st degree AV block. Satisfied with current treatment? yes Duration of hypertension: chronic BP monitoring frequency: weekly BP range: 130/70-80 range  BP medication side effects: no Past BP meds:  Duration of hyperlipidemia: chronic Cholesterol medication side effects: no Cholesterol supplements: none Past cholesterol medications:  Medication compliance: excellent compliance Aspirin: no Recent stressors: no Recurrent headaches: no Visual changes: no Palpitations: no Dyspnea: no Chest pain: no Lower extremity edema: no Dizzy/lightheaded: no    ASTHMA Continues on Albuterol PRN and Singulair daily. Asthma status: stable Satisfied with current treatment?: yes Albuterol/rescue inhaler frequency: not in long while Dyspnea frequency:  Wheezing frequency: no Cough frequency: no Nocturnal symptom frequency:  Limitation of activity: no Current upper respiratory symptoms: no Triggers:  Home peak flows: Last Spirometry:  Failed/intolerant to following asthma meds:   Asthma meds in past:  Aerochamber/spacer use: no Visits to ER or Urgent Care in past year: no Pneumovax: Up to Date Influenza: Up to Date   HYPOTHYROIDISM Continues on Levothyroxine 100 MG daily.  Last visit with endo was 01/20/22 with no changes. Thyroid control status:stable Satisfied with current treatment? yes Medication side effects: no Medication compliance: good compliance Etiology of hypothyroidism: postablative Recent dose adjustment:no Fatigue: no Cold intolerance: no Heat intolerance: no Weight gain: no Weight loss: no Constipation: no Diarrhea/loose stools: no Palpitations: no Lower extremity edema: no Anxiety/depressed mood: no   KNEE PAIN Ongoing knee pain bilaterally since 2019.  Recently went to football game which exacerbated pain.  R>L pain.  Can not take pain anymore. In 2018 had imaging of left knee noting moderate degenerative changes.  Has underlying osteopenia. Duration: chronic Involved knee: bilateral Mechanism of injury: unknown Location:anterior Onset: gradual Severity: 8/10  Quality:  dull, aching, and throbbing Frequency: intermittent Radiation: no Aggravating factors: weight bearing, walking, bending, and movement  Alleviating factors: nothing  Status: worse Treatments attempted: rest, ice, heat, APAP, and ibuprofen , knee braces Relief with NSAIDs?:  no Weakness with weight bearing or walking: no Sensation of giving way: no Locking: no Popping: yes Bruising: no Swelling: no Redness: no Paresthesias/decreased sensation: no Fevers: no    Relevant past medical, surgical, family and social history reviewed and updated as indicated. Interim medical history since our last visit reviewed. Allergies and medications reviewed and updated.  Review of Systems  Constitutional:  Negative for activity change, appetite change, diaphoresis, fatigue and fever.  Respiratory:  Negative for cough,  chest tightness and shortness of breath.    Cardiovascular:  Negative for chest pain, palpitations and leg swelling.  Gastrointestinal: Negative.   Musculoskeletal:  Positive for arthralgias.  Neurological: Negative.   Psychiatric/Behavioral: Negative.      Per HPI unless specifically indicated above     Objective:    BP 128/78 (BP Location: Left Arm, Patient Position: Sitting, Cuff Size: Large)   Pulse 78   Temp 97.8 F (36.6 C) (Oral)   Ht _0  (1.651 m)   Wt 242 lb (109.8 kg)   SpO2 99%   BMI 40.27 kg/m   Wt Readings from Last 3 Encounters:  03/28/22 242 lb (109.8 kg)  10/17/21 228 lb (103.4 kg)  08/14/21 241 lb 3.2 oz (109.4 kg)    Physical Exam Vitals and nursing note reviewed.  Constitutional:      General: She is awake. She is not in acute distress.    Appearance: She is well-developed and well-groomed. She is morbidly obese. She is not ill-appearing or toxic-appearing.  HENT:     Head: Normocephalic.     Right Ear: Hearing and external ear normal.     Left Ear: Hearing and external ear normal.  Eyes:     General: Lids are normal.        Right eye: No discharge.        Left eye: No discharge.     Conjunctiva/sclera: Conjunctivae normal.     Pupils: Pupils are equal, round, and reactive to light.  Neck:     Vascular: No carotid bruit.  Cardiovascular:     Rate and Rhythm: Normal rate and regular rhythm.     Heart sounds: Normal heart sounds. No murmur heard.    No gallop.  Pulmonary:     Effort: Pulmonary effort is normal. No accessory muscle usage or respiratory distress.     Breath sounds: Normal breath sounds.  Abdominal:     General: Bowel sounds are normal.     Palpations: Abdomen is soft.  Musculoskeletal:     Cervical back: Normal range of motion and neck supple.     Right knee: Crepitus present. No swelling, erythema or bony tenderness. Normal range of motion. No tenderness.     Instability Tests: Anterior drawer test negative. Posterior drawer test negative. Anterior Lachman test  negative.     Left knee: Crepitus present. No swelling, erythema or bony tenderness. Normal range of motion. No tenderness.     Instability Tests: Anterior drawer test negative. Posterior drawer test negative. Anterior Lachman test negative.     Right lower leg: No edema.     Left lower leg: No edema.  Lymphadenopathy:     Cervical: No cervical adenopathy.  Skin:    General: Skin is warm and dry.  Neurological:     Mental Status: She is alert and oriented to person, place, and time.     Gait: Gait is intact.     Deep Tendon Reflexes: Reflexes are normal and symmetric.     Reflex Scores:      Brachioradialis reflexes are 2+ on the right side and 2+ on the left side.      Patellar reflexes are 2+ on the right side and 2+ on the left side. Psychiatric:        Attention and Perception: Attention normal.        Mood and Affect: Mood normal.        Speech: Speech normal.  Behavior: Behavior normal. Behavior is cooperative.        Thought Content: Thought content normal.    Results for orders placed or performed in visit on 08/14/21  Microalbumin, Urine Waived  Result Value Ref Range   Microalb, Ur Waived 10 0 - 19 mg/L   Creatinine, Urine Waived 100 10 - 300 mg/dL   Microalb/Creat Ratio <30 <30 mg/g  Bayer DCA Hb A1c Waived  Result Value Ref Range   HB A1C (BAYER DCA - WAIVED) 5.1 4.8 - 5.6 %  CBC with Differential/Platelet  Result Value Ref Range   WBC 3.6 3.4 - 10.8 x10E3/uL   RBC 4.67 3.77 - 5.28 x10E6/uL   Hemoglobin 12.8 11.1 - 15.9 g/dL   Hematocrit 40.5 34.0 - 46.6 %   MCV 87 79 - 97 fL   MCH 27.4 26.6 - 33.0 pg   MCHC 31.6 31.5 - 35.7 g/dL   RDW 14.5 11.7 - 15.4 %   Platelets 216 150 - 450 x10E3/uL   Neutrophils 50 Not Estab. %   Lymphs 40 Not Estab. %   Monocytes 9 Not Estab. %   Eos 0 Not Estab. %   Basos 1 Not Estab. %   Neutrophils Absolute 1.8 1.4 - 7.0 x10E3/uL   Lymphocytes Absolute 1.4 0.7 - 3.1 x10E3/uL   Monocytes Absolute 0.3 0.1 - 0.9 x10E3/uL    EOS (ABSOLUTE) 0.0 0.0 - 0.4 x10E3/uL   Basophils Absolute 0.0 0.0 - 0.2 x10E3/uL   Immature Granulocytes 0 Not Estab. %   Immature Grans (Abs) 0.0 0.0 - 0.1 x10E3/uL  Comprehensive metabolic panel  Result Value Ref Range   Glucose 97 70 - 99 mg/dL   BUN 11 8 - 27 mg/dL   Creatinine, Ser 0.58 0.57 - 1.00 mg/dL   eGFR 96 >59 mL/min/1.73   BUN/Creatinine Ratio 19 12 - 28   Sodium 139 134 - 144 mmol/L   Potassium 4.1 3.5 - 5.2 mmol/L   Chloride 102 96 - 106 mmol/L   CO2 24 20 - 29 mmol/L   Calcium 9.4 8.7 - 10.3 mg/dL   Total Protein 7.2 6.0 - 8.5 g/dL   Albumin 4.0 3.7 - 4.7 g/dL   Globulin, Total 3.2 1.5 - 4.5 g/dL   Albumin/Globulin Ratio 1.3 1.2 - 2.2   Bilirubin Total 0.3 0.0 - 1.2 mg/dL   Alkaline Phosphatase 56 44 - 121 IU/L   AST 17 0 - 40 IU/L   ALT 19 0 - 32 IU/L  Lipid Panel w/o Chol/HDL Ratio  Result Value Ref Range   Cholesterol, Total 120 100 - 199 mg/dL   Triglycerides 62 0 - 149 mg/dL   HDL 58 >39 mg/dL   VLDL Cholesterol Cal 13 5 - 40 mg/dL   LDL Chol Calc (NIH) 49 0 - 99 mg/dL  VITAMIN D 25 Hydroxy (Vit-D Deficiency, Fractures)  Result Value Ref Range   Vit D, 25-Hydroxy 68.1 30.0 - 100.0 ng/mL      Assessment & Plan:   Problem List Items Addressed This Visit       Cardiovascular and Mediastinum   1st degree AV block    Stable, seen by cardiology 07/31/21 and tolerating Metoprolol.  Continue this medication regimen and adjust as needed.      Hypertension    Chronic, stable.  BP at goal in office and on home readings. Recommend she monitor BP at least a few mornings a week at home and document.  DASH diet at home.  Continue current  medication regimen and adjust as needed.  Labs today: CMP.  Return in 6 months.       Relevant Orders   Comprehensive metabolic panel     Respiratory   Asthma    Chronic, stable.  Minimal Albuterol use. Continue current medication regimen and adjust as needed.  Spirometry annually.  Return in 6 months.         Endocrine   Postablative hypothyroidism    Chronic, ongoing.  Followed by endo, continue this collaboration.  Recent note and labs reviewed.          Musculoskeletal and Integument   Knee osteoarthritis    Chronic, ongoing since 2018.  Worsening.  Will place referral to ortho due to known OA present and worsening symptoms.  Suspect need repeat imaging and possible PT -- in future may need replacement, for now will get her established with ortho for recommendations.      Relevant Orders   Ambulatory referral to Orthopedics   Osteopenia of neck of right femur    Chronic, ongoing.  DEXA scan 05/06/2021 reviewed, BMD measured at Femur Neck Right is 0.796 g/cm2 with a T-score of -1.7.  Continue daily supplements, check Vit D level at physical.        Other   Mixed hyperlipidemia    Chronic, ongoing. Continue Crestor as is tolerating without ADR, did not tolerate Atorvastatin in past.  Lipid panel today.  Return in 6 months.      Relevant Orders   Comprehensive metabolic panel   Lipid Panel w/o Chol/HDL Ratio   Morbid obesity with BMI of 40.0-44.9, adult (HCC) - Primary    BMI 40.27 with underlying HTN and Asthma. Recommended eating smaller high protein, low fat meals more frequently and exercising 30 mins a day 5 times a week with a goal of 10-15lb weight loss in the next 3 months. Patient voiced their understanding and motivation to adhere to these recommendations.       Other Visit Diagnoses     Flu vaccine need       Flu vaccine in office today.   Relevant Orders   Flu Vaccine QUAD High Dose(Fluad)        Follow up plan: Return in about 6 months (around 09/27/2022) for Annual physical.

## 2022-03-28 NOTE — Assessment & Plan Note (Signed)
Chronic, ongoing since 2018.  Worsening.  Will place referral to ortho due to known OA present and worsening symptoms.  Suspect need repeat imaging and possible PT -- in future may need replacement, for now will get her established with ortho for recommendations.

## 2022-03-28 NOTE — Assessment & Plan Note (Signed)
Chronic, ongoing.  Followed by endo, continue this collaboration.  Recent note and labs reviewed.

## 2022-03-28 NOTE — Assessment & Plan Note (Signed)
BMI 40.27 with underlying HTN and Asthma. Recommended eating smaller high protein, low fat meals more frequently and exercising 30 mins a day 5 times a week with a goal of 10-15lb weight loss in the next 3 months. Patient voiced their understanding and motivation to adhere to these recommendations.

## 2022-03-28 NOTE — Assessment & Plan Note (Signed)
Chronic, stable.  Minimal Albuterol use. Continue current medication regimen and adjust as needed.  Spirometry annually.  Return in 6 months.

## 2022-03-28 NOTE — Assessment & Plan Note (Signed)
Chronic, ongoing.  DEXA scan 05/06/2021 reviewed, BMD measured at Femur Neck Right is 0.796 g/cm2 with a T-score of -1.7.  Continue daily supplements, check Vit D level at physical.

## 2022-03-28 NOTE — Assessment & Plan Note (Signed)
Chronic, stable.  BP at goal in office and on home readings. Recommend she monitor BP at least a few mornings a week at home and document.  DASH diet at home.  Continue current medication regimen and adjust as needed.  Labs today: CMP.  Return in 6 months.

## 2022-03-28 NOTE — Assessment & Plan Note (Signed)
Chronic, ongoing. Continue Crestor as is tolerating without ADR, did not tolerate Atorvastatin in past.  Lipid panel today.  Return in 6 months.

## 2022-03-29 LAB — COMPREHENSIVE METABOLIC PANEL
ALT: 18 IU/L (ref 0–32)
AST: 18 IU/L (ref 0–40)
Albumin/Globulin Ratio: 1.7 (ref 1.2–2.2)
Albumin: 4.5 g/dL (ref 3.8–4.8)
Alkaline Phosphatase: 68 IU/L (ref 44–121)
BUN/Creatinine Ratio: 20 (ref 12–28)
BUN: 12 mg/dL (ref 8–27)
Bilirubin Total: 0.3 mg/dL (ref 0.0–1.2)
CO2: 25 mmol/L (ref 20–29)
Calcium: 9.5 mg/dL (ref 8.7–10.3)
Chloride: 100 mmol/L (ref 96–106)
Creatinine, Ser: 0.61 mg/dL (ref 0.57–1.00)
Globulin, Total: 2.7 g/dL (ref 1.5–4.5)
Glucose: 92 mg/dL (ref 70–99)
Potassium: 4.1 mmol/L (ref 3.5–5.2)
Sodium: 138 mmol/L (ref 134–144)
Total Protein: 7.2 g/dL (ref 6.0–8.5)
eGFR: 95 mL/min/{1.73_m2} (ref >59)

## 2022-03-29 LAB — LIPID PANEL W/O CHOL/HDL RATIO
Cholesterol, Total: 130 mg/dL (ref 100–199)
HDL: 65 mg/dL (ref >39)
LDL Chol Calc (NIH): 51 mg/dL (ref 0–99)
Triglycerides: 68 mg/dL (ref 0–149)
VLDL Cholesterol Cal: 14 mg/dL (ref 5–40)

## 2022-03-29 NOTE — Progress Notes (Signed)
Good morning, please let Corinne know her labs have returned and everything remains nice and normal.  No medication changes needed.  Cholesterol levels look great with Rosuvastatin.  Any questions? Keep being awesome!!  Thank you for allowing me to participate in your care.  I appreciate you. Kindest regards, Sharra Cayabyab

## 2022-04-03 DIAGNOSIS — Z0100 Encounter for examination of eyes and vision without abnormal findings: Secondary | ICD-10-CM | POA: Diagnosis not present

## 2022-04-14 ENCOUNTER — Other Ambulatory Visit: Payer: Self-pay | Admitting: Nurse Practitioner

## 2022-04-14 DIAGNOSIS — M1712 Unilateral primary osteoarthritis, left knee: Secondary | ICD-10-CM | POA: Diagnosis not present

## 2022-04-14 DIAGNOSIS — M1711 Unilateral primary osteoarthritis, right knee: Secondary | ICD-10-CM | POA: Diagnosis not present

## 2022-04-14 DIAGNOSIS — M170 Bilateral primary osteoarthritis of knee: Secondary | ICD-10-CM | POA: Diagnosis not present

## 2022-04-15 NOTE — Telephone Encounter (Signed)
Requested Prescriptions  Pending Prescriptions Disp Refills  . meloxicam (MOBIC) 15 MG tablet [Pharmacy Med Name: MELOXICAM 15 MG Tablet] 90 tablet 0    Sig: TAKE 1 TABLET EVERY DAY     Analgesics:  COX2 Inhibitors Failed - 04/14/2022 10:23 AM      Failed - Manual Review: Labs are only required if the patient has taken medication for more than 8 weeks.      Passed - HGB in normal range and within 360 days    Hemoglobin  Date Value Ref Range Status  08/14/2021 12.8 11.1 - 15.9 g/dL Final         Passed - Cr in normal range and within 360 days    Creatinine, Ser  Date Value Ref Range Status  03/28/2022 0.61 0.57 - 1.00 mg/dL Final         Passed - HCT in normal range and within 360 days    Hematocrit  Date Value Ref Range Status  08/14/2021 40.5 34.0 - 46.6 % Final         Passed - AST in normal range and within 360 days    AST  Date Value Ref Range Status  03/28/2022 18 0 - 40 IU/L Final         Passed - ALT in normal range and within 360 days    ALT  Date Value Ref Range Status  03/28/2022 18 0 - 32 IU/L Final         Passed - eGFR is 30 or above and within 360 days    GFR calc Af Amer  Date Value Ref Range Status  08/07/2020 102 >59 mL/min/1.73 Final    Comment:    **In accordance with recommendations from the NKF-ASN Task force,**   [facility] is in the process of updating its eGFR calculation to the   2021 CKD-EPI creatinine equation that estimates kidney function   without a race variable.    GFR, Estimated  Date Value Ref Range Status  11/16/2020 >60 >60 mL/min Final    Comment:    (NOTE) Calculated using the CKD-EPI Creatinine Equation (2021)    eGFR  Date Value Ref Range Status  03/28/2022 95 >59 mL/min/1.73 Final         Passed - Patient is not pregnant      Passed - Valid encounter within last 12 months    Recent Outpatient Visits          2 weeks ago Morbid obesity with BMI of 40.0-44.9, adult ([place])   [place], Jolene  T, NP   8 months ago Morbid obesity with BMI of 40.0-44.9, adult ([place])   [place], Barbaraann Faster, NP   8 months ago Primary hypertension   [place], [place] T, NP   1 year ago Morbid obesity with BMI of 40.0-44.9, adult ([place])   [place], Jolene T, NP   1 year ago Cerebral infarction, remote, resolved   [place], Barbaraann Faster, NP      Future Appointments            In 5 months Cannady, Barbaraann Faster, NP [facility], [facility]

## 2022-04-23 IMAGING — MR MR HEAD WO/W CM
14 series · 48 of 48 positions shown · IV contrast (gadavist)
Comparison: None.

CLINICAL DATA: Chronic left frontal headaches

EXAM:
MRI HEAD WITHOUT AND WITH CONTRAST
TECHNIQUE: Multiplanar, multiecho pulse sequences of the brain and surrounding
structures were obtained without and with intravenous contrast.
CONTRAST:  10mL GADAVIST GADOBUTROL 1 MMOL/ML IV SOLN

[Series 5: ax dwi_tracew · axial · 3.0mm · 0.65mm/px · z∈[-67,+86]mm · 4 of 48 slices shown]
[im 1/48]
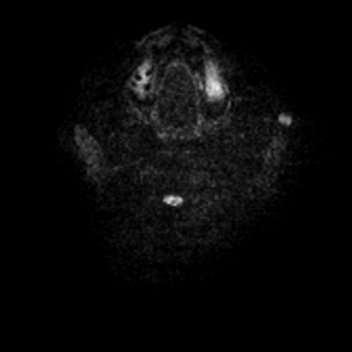
[im 16/48]
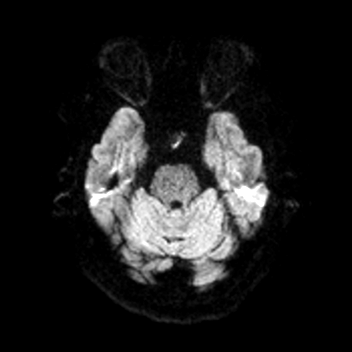
[im 32/48]
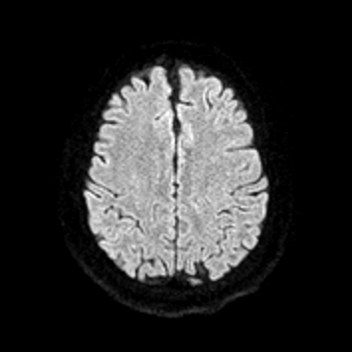
[im 48/48]
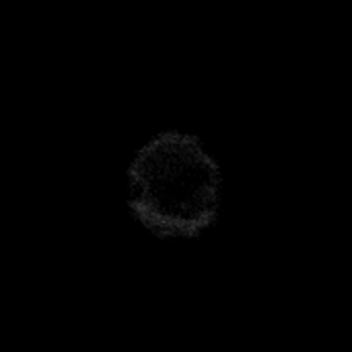

[Series 6: ax dwi_adc · axial · 3.0mm · 0.65mm/px · z∈[-67,+86]mm · 4 of 48 slices shown]
[im 1/48]
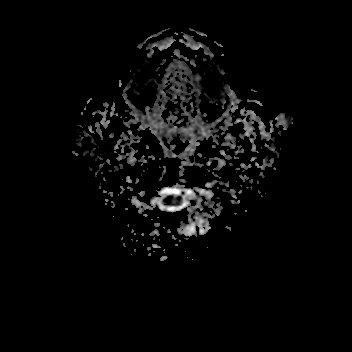
[im 16/48]
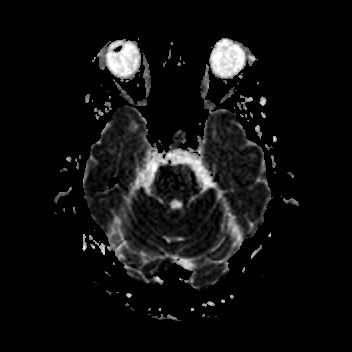
[im 32/48]
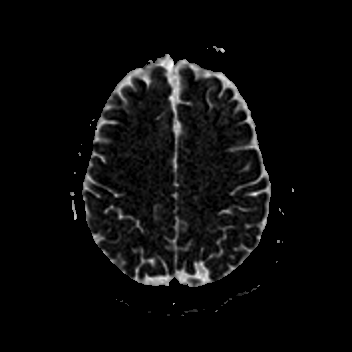
[im 48/48]
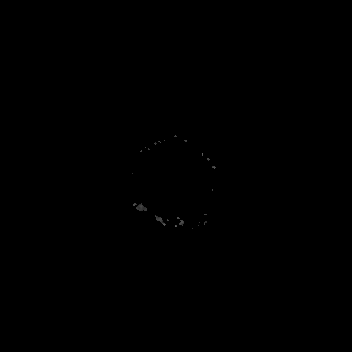

[Series 7: cor dwi_tracew · coronal · 5.0mm · 0.65mm/px · 2 of 40 slices shown]
[im 1/40]
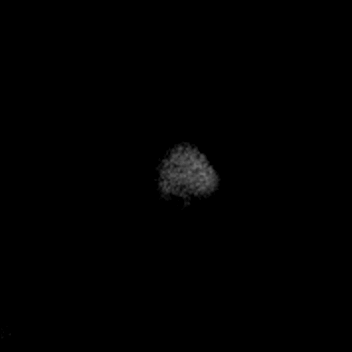
[im 40/40]
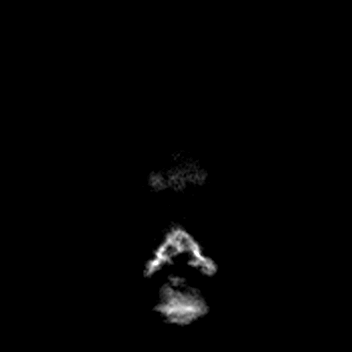

[Series 8: cor dwi_adc · coronal · 5.0mm · 0.65mm/px · 2 of 39 slices shown]
[im 1/39]
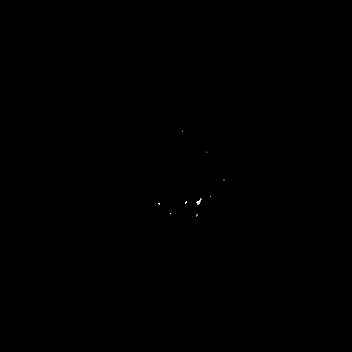
[im 39/39]
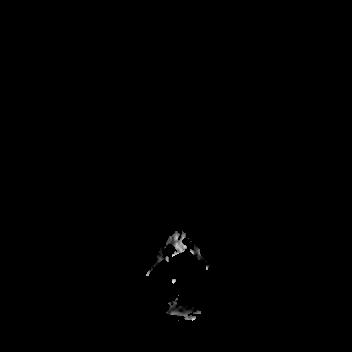

[Series 9: T1 · sagittal · 5.0mm · 0.62mm/px · 1 of 25 slices shown (1 of 2)]
[im 1/25]
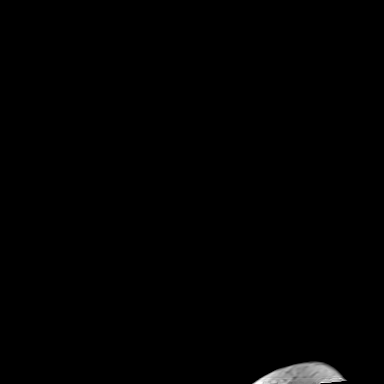

[Series 10: T2 · axial · 5.0mm · 0.53mm/px · 1 of 25 slices shown]
[im 1/25]
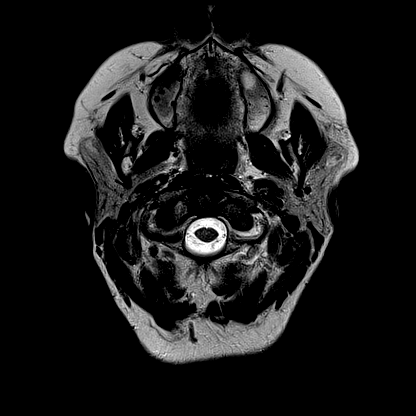

[Series 12: pha_images · axial · 3.0mm · 0.90mm/px · z∈[-79,+96]mm · 3 of 60 slices shown]
[im 1/60]
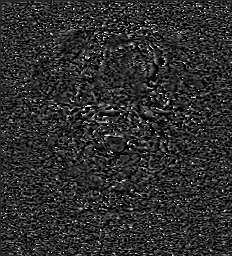
[im 30/60]
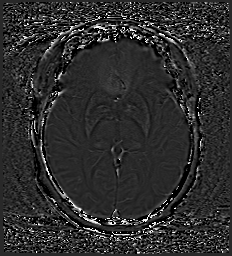
[im 60/60]
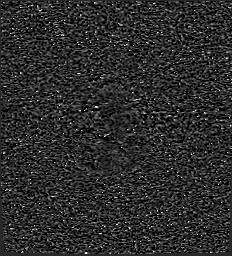

[Series 13: swi_images · axial · 3.0mm · 0.90mm/px · z∈[-79,+96]mm · 3 of 60 slices shown]
[im 1/60]
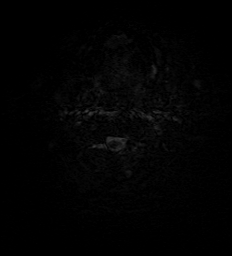
[im 30/60]
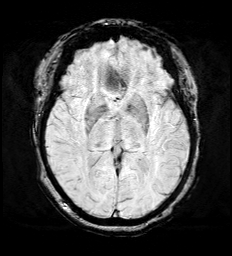
[im 60/60]
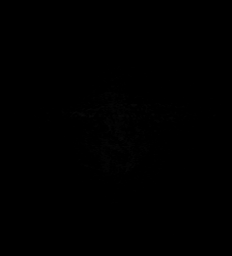

[Series 15: FLAIR · axial · 3.0mm · 0.53mm/px · z∈[-71,+88]mm · 3 of 55 slices shown]
[im 1/55]
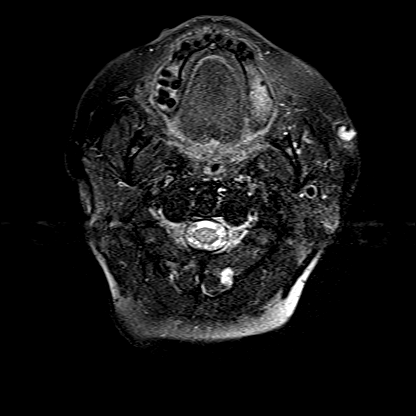
[im 28/55]
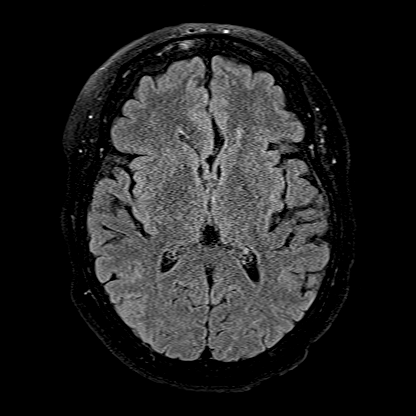
[im 55/55]
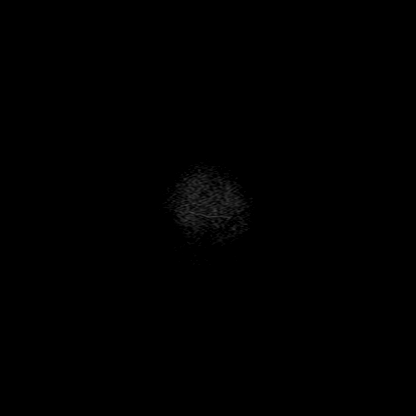

[Series 16: T1 · axial · 1.0mm · 0.98mm/px · z∈[-75,+97]mm · 10 of 175 slices shown (2 of 2)]
[im 1/175]
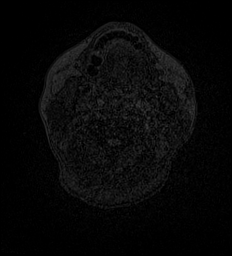
[im 20/175]
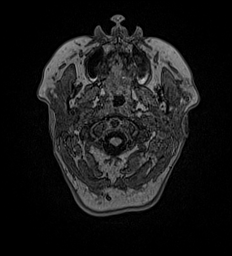
[im 39/175]
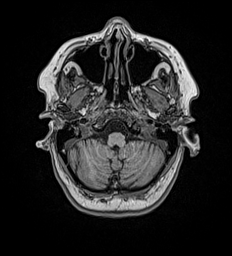
[im 59/175]
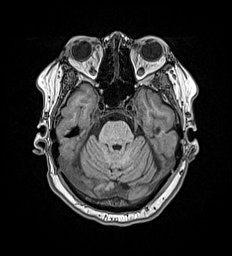
[im 78/175]
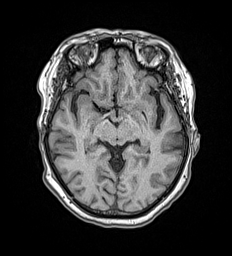
[im 97/175]
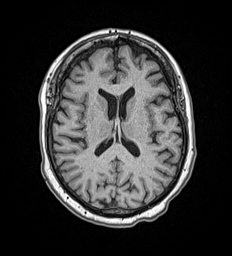
[im 117/175]
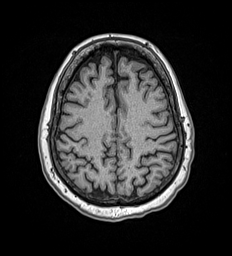
[im 136/175]
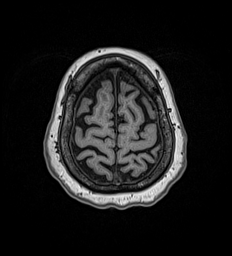
[im 155/175]
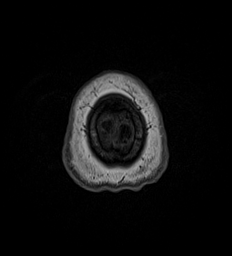
[im 175/175]
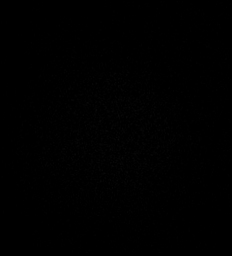

[Series 17: T2 post-contrast · coronal · 5.0mm · 0.57mm/px · 2 of 29 slices shown]
[im 1/29]
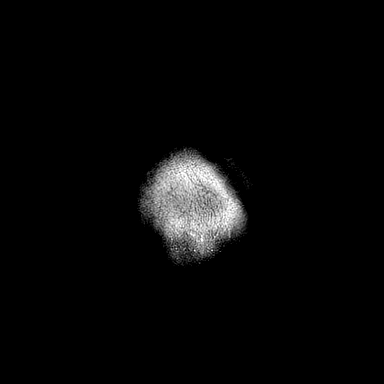
[im 29/29]
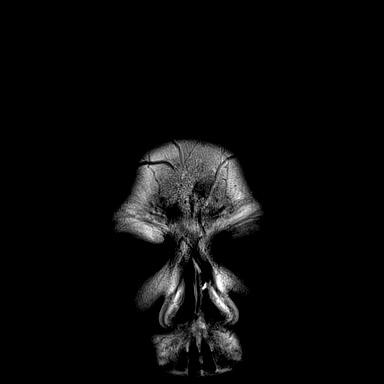

[Series 18: T1 post-contrast · axial · 1.0mm · 0.98mm/px · z∈[-75,+97]mm · 10 of 176 slices shown (1 of 3)]
[im 1/176]
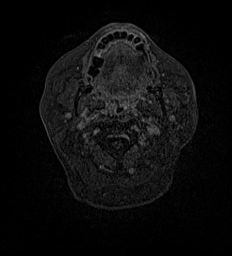
[im 20/176]
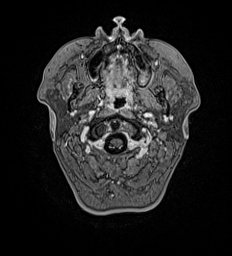
[im 39/176]
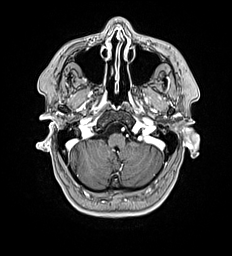
[im 59/176]
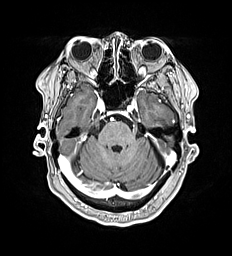
[im 78/176]
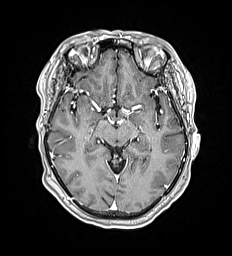
[im 98/176]
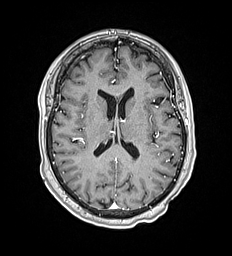
[im 117/176]
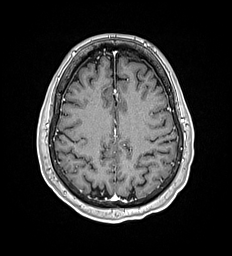
[im 137/176]
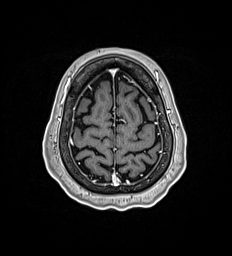
[im 156/176]
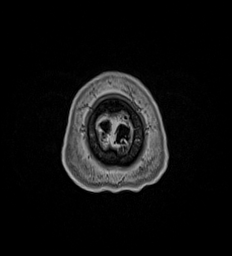
[im 176/176]
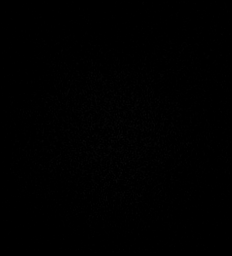

[Series 19: T1 post-contrast · coronal · 5.0mm · 0.57mm/px · 2 of 29 slices shown (2 of 3)]
[im 1/29]
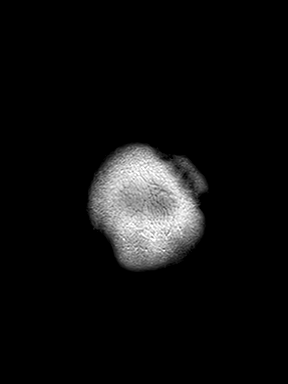
[im 29/29]
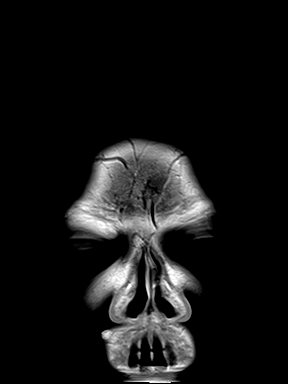

[Series 20: T1 post-contrast · sagittal · 5.0mm · 0.62mm/px · 1 of 25 slices shown (3 of 3)]
[im 1/25]
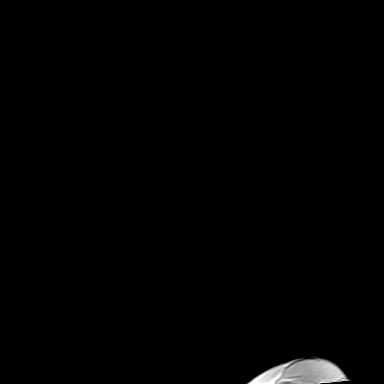

[48 of 48 positions shown; findings below may reference images not displayed]

FINDINGS: Brain: Small remote left cerebellar infarct posteriorly. No acute or
subacute infarct, hemorrhage, hydrocephalus, mass, or collection.
Brain volume is normal. No specific posttraumatic finding.

Vascular: Normal flow voids.

Skull and upper cervical spine: Normal marrow signal

Sinuses/Orbits: Left cataract resection.

Other: Cystic nodule in the subcutaneous left cheek measuring 9 mm.
IMPRESSION: 1. No specific explanation for headache.  No posttraumatic finding.
2. Small remote left cerebellar infarct.
3. Nonspecific subcutaneous nodule in left cheek, please correlate
with exam.

## 2022-04-28 DIAGNOSIS — Z03818 Encounter for observation for suspected exposure to other biological agents ruled out: Secondary | ICD-10-CM | POA: Diagnosis not present

## 2022-04-28 DIAGNOSIS — Z20822 Contact with and (suspected) exposure to covid-19: Secondary | ICD-10-CM | POA: Diagnosis not present

## 2022-04-29 ENCOUNTER — Other Ambulatory Visit: Payer: Self-pay | Admitting: Nurse Practitioner

## 2022-04-29 DIAGNOSIS — Z1231 Encounter for screening mammogram for malignant neoplasm of breast: Secondary | ICD-10-CM

## 2022-05-13 DIAGNOSIS — M25562 Pain in left knee: Secondary | ICD-10-CM | POA: Diagnosis not present

## 2022-05-13 DIAGNOSIS — M25561 Pain in right knee: Secondary | ICD-10-CM | POA: Diagnosis not present

## 2022-05-13 DIAGNOSIS — M170 Bilateral primary osteoarthritis of knee: Secondary | ICD-10-CM | POA: Diagnosis not present

## 2022-05-29 ENCOUNTER — Ambulatory Visit
Admission: RE | Admit: 2022-05-29 | Discharge: 2022-05-29 | Disposition: A | Discharge status: Home / Self Care | Payer: Medicare HMO | Source: Ambulatory Visit | Attending: Nurse Practitioner | Admitting: Nurse Practitioner

## 2022-05-29 DIAGNOSIS — Z1231 Encounter for screening mammogram for malignant neoplasm of breast: Secondary | ICD-10-CM | POA: Insufficient documentation

## 2022-06-04 ENCOUNTER — Other Ambulatory Visit: Payer: Self-pay | Admitting: Nurse Practitioner

## 2022-06-05 NOTE — Telephone Encounter (Signed)
Requested Prescriptions  Pending Prescriptions Disp Refills   lisinopril (ZESTRIL) 5 MG tablet [Pharmacy Med Name: LISINOPRIL 5 MG Tablet] 90 tablet 1    Sig: TAKE 1 TABLET EVERY DAY     Cardiovascular:  ACE Inhibitors Passed - 06/04/2022  8:04 PM      Passed - Cr in normal range and within 180 days    Creatinine, Ser  Date Value Ref Range Status  03/28/2022 0.61 0.57 - 1.00 mg/dL Final         Passed - K in normal range and within 180 days    Potassium  Date Value Ref Range Status  03/28/2022 4.1 3.5 - 5.2 mmol/L Final         Passed - Patient is not pregnant      Passed - Last BP in normal range    BP Readings from Last 1 Encounters:  03/28/22 128/78         Passed - Valid encounter within last 6 months    Recent Outpatient Visits           2 months ago Morbid obesity with BMI of 40.0-44.9, adult (HCC)   [facility] Cannady, Jolene T, NP   9 months ago Morbid obesity with BMI of 40.0-44.9, adult (HCC)   [facility] Cannady, Dorie Rank, NP   10 months ago Primary hypertension   [facility] [place], [place] T, NP   1 year ago Morbid obesity with BMI of 40.0-44.9, adult (HCC)   [facility] Cannady, Jolene T, NP   1 year ago Cerebral infarction, remote, resolved   [facility] [place], Dorie Rank, NP       Future Appointments             In 3 months Cannady, Dorie Rank, NP [facility], [facility]

## 2022-06-30 ENCOUNTER — Other Ambulatory Visit: Payer: Self-pay | Admitting: Nurse Practitioner

## 2022-07-01 NOTE — Telephone Encounter (Signed)
Requested Prescriptions  Pending Prescriptions Disp Refills   montelukast (SINGULAIR) 10 MG tablet [Pharmacy Med Name: MONTELUKAST SODIUM 10 MG Tablet] 90 tablet 0    Sig: TAKE 1 TABLET AT BEDTIME     Pulmonology:  Leukotriene Inhibitors Passed - 06/30/2022 10:53 AM      Passed - Valid encounter within last 12 months    Recent Outpatient Visits           3 months ago Morbid obesity with BMI of 40.0-44.9, adult ([place])   [place] Cannady, Jolene T, NP   10 months ago Morbid obesity with BMI of 40.0-44.9, adult ([place])   [place], Barbaraann Faster, NP   11 months ago Primary hypertension   [place], [place] T, NP   1 year ago Morbid obesity with BMI of 40.0-44.9, adult ([place])   [place], Jolene T, NP   1 year ago Cerebral infarction, remote, resolved   [place], Barbaraann Faster, NP       Future Appointments             In 3 months Cannady, Jolene T, NP [facility], [facility]             meloxicam (MOBIC) 15 MG tablet [Pharmacy Med Name: MELOXICAM 15 MG Tablet] 90 tablet 0    Sig: TAKE 1 TABLET EVERY DAY     Analgesics:  COX2 Inhibitors Failed - 06/30/2022 10:53 AM      Failed - Manual Review: Labs are only required if the patient has taken medication for more than 8 weeks.      Passed - HGB in normal range and within 360 days    Hemoglobin  Date Value Ref Range Status  08/14/2021 12.8 11.1 - 15.9 g/dL Final         Passed - Cr in normal range and within 360 days    Creatinine, Ser  Date Value Ref Range Status  03/28/2022 0.61 0.57 - 1.00 mg/dL Final         Passed - HCT in normal range and within 360 days    Hematocrit  Date Value Ref Range Status  08/14/2021 40.5 34.0 - 46.6 % Final         Passed - AST in normal range and within 360 days    AST  Date Value Ref Range Status  03/28/2022 18 0 - 40 IU/L Final         Passed - ALT in normal range and within 360  days    ALT  Date Value Ref Range Status  03/28/2022 18 0 - 32 IU/L Final         Passed - eGFR is 30 or above and within 360 days    GFR calc Af Amer  Date Value Ref Range Status  08/07/2020 102 >59 mL/min/1.73 Final    Comment:    **In accordance with recommendations from the NKF-ASN Task force,**   [facility] is in the process of updating its eGFR calculation to the   2021 CKD-EPI creatinine equation that estimates kidney function   without a race variable.    GFR, Estimated  Date Value Ref Range Status  11/16/2020 >60 >60 mL/min Final    Comment:    (NOTE) Calculated using the CKD-EPI Creatinine Equation (2021)    eGFR  Date Value Ref Range Status  03/28/2022 95 >59 mL/min/1.73 Final         Passed - Patient  is not pregnant      Passed - Valid encounter within last 12 months    Recent Outpatient Visits           3 months ago Morbid obesity with BMI of 40.0-44.9, adult ([place])   [place], Jolene T, NP   10 months ago Morbid obesity with BMI of 40.0-44.9, adult ([place])   [place], Barbaraann Faster, NP   11 months ago Primary hypertension   [place], [place] T, NP   1 year ago Morbid obesity with BMI of 40.0-44.9, adult ([place])   [place], Jolene T, NP   1 year ago Cerebral infarction, remote, resolved   [place], Barbaraann Faster, NP       Future Appointments             In 3 months Cannady, Barbaraann Faster, NP [facility], [facility]

## 2022-07-07 ENCOUNTER — Other Ambulatory Visit: Payer: Self-pay | Admitting: Nurse Practitioner

## 2022-07-08 NOTE — Telephone Encounter (Signed)
Requested medication (s) are due for refill today - yes  Requested medication (s) are on the active medication list -yes  Future visit scheduled -yes  Last refill: 01/22/22 22g 4RF  Notes to clinic: medication not assigned to a protocol- provider review    Requested Prescriptions  Pending Prescriptions Disp Refills   mupirocin ointment (BACTROBAN) 2 % [Pharmacy Med Name: MUPIROCIN 2 % Ointment] 22 g 3    Sig: APPLY TOPICALLY 2 TIMES DAILY     Off-Protocol Failed - 07/07/2022  8:12 PM      Failed - Medication not assigned to a protocol, review manually.      Passed - Valid encounter within last 12 months    Recent Outpatient Visits           3 months ago Morbid obesity with BMI of 40.0-44.9, adult ([place])   [place], Jolene T, NP   10 months ago Morbid obesity with BMI of 40.0-44.9, adult ([place])   [place], Barbaraann Faster, NP   11 months ago Primary hypertension   [place], [place] T, NP   1 year ago Morbid obesity with BMI of 40.0-44.9, adult ([place])   [place], Henrine Screws T, NP   1 year ago Cerebral infarction, remote, resolved   [place], Barbaraann Faster, NP       Future Appointments             In 2 months Cannady, Barbaraann Faster, NP [place], [facility]               Requested Prescriptions  Pending Prescriptions Disp Refills   mupirocin ointment (BACTROBAN) 2 % [Pharmacy Med Name: MUPIROCIN 2 % Ointment] 22 g 3    Sig: APPLY TOPICALLY 2 TIMES DAILY     Off-Protocol Failed - 07/07/2022  8:12 PM      Failed - Medication not assigned to a protocol, review manually.      Passed - Valid encounter within last 12 months    Recent Outpatient Visits           3 months ago Morbid obesity with BMI of 40.0-44.9, adult ([place])   [place], Jolene T, NP   10 months  ago Morbid obesity with BMI of 40.0-44.9, adult ([place])   [place], Barbaraann Faster, NP   11 months ago Primary hypertension   [place], Joseph T, NP   1 year ago Morbid obesity with BMI of 40.0-44.9, adult ([place])   [place], Henrine Screws T, NP   1 year ago Cerebral infarction, remote, resolved   [place], Barbaraann Faster, NP       Future Appointments             In 2 months Cannady, Barbaraann Faster, NP [place], [facility]

## 2022-07-18 ENCOUNTER — Ambulatory Visit: Payer: Self-pay | Admitting: *Deleted

## 2022-07-18 NOTE — Telephone Encounter (Signed)
  Chief Complaint: skin irritation under skin folds Symptoms: skin dark in color, irritated under skin folds to stomach, thighs, groin area. Reports when wiping areas brownish like skin noted. Odor noted. No bleeding skin intact per patient. Possible MASD Frequency: 4 months  Pertinent Negatives: Patient denies fever no bleeding no drainage Disposition: [] ED /[] Urgent Care (no appt availability in office) / [x] Appointment(In office/virtual)/ []  [place] [facility]/ [] Home Care/ [] Refused Recommended Disposition /[] [place] [facility]/ []  Follow-up with PCP Additional Notes:   Patient reports she has been embarrassed to call PCP. Provided emotional support. Recommended to shower and clean areas with soap and water and rinse thoroughly. Pat dry all areas. Allow to air dry if possible. Please advise . Appt schedule for 07/25/22. Recommended if worsen sx call back or go to UC/ED.     Reason for Disposition  Minor scrape (abrasion)  Answer Assessment - Initial Assessment Questions 1. APPEARANCE of INJURY: "What does the injury look like?"      No injury skin dark in color irritated under skin folds 2. SIZE: "How large is the cut?"      na 3. BLEEDING: "Is it bleeding now?" If Yes, ask: "Is it difficult to stop?"      no 4. LOCATION: "Where is the injury located?"      Moisture associated skin damage possible under skin folds under stomach, thighs, groin area 5. ONSET: "How long ago did the injury occur?"      4 months  6. MECHANISM: "Tell me how it happened."      Noted when cleaning under skin folds brownish like skin noted on wash cloth 7. TETANUS: "When was the last tetanus booster?"     na 8. PREGNANCY: "Is there any chance you are pregnant?" "When was your last menstrual period?"     na  Protocols used: Skin Injury-A-AH

## 2022-07-24 ENCOUNTER — Ambulatory Visit (INDEPENDENT_AMBULATORY_CARE_PROVIDER_SITE_OTHER): Payer: Medicare HMO | Admitting: Physician Assistant

## 2022-07-24 ENCOUNTER — Encounter: Payer: Self-pay | Admitting: Physician Assistant

## 2022-07-24 VITALS — BP 131/83 | HR 79 | Temp 98.4°F | Ht 65.0 in | Wt 241.4 lb

## 2022-07-24 DIAGNOSIS — L814 Other melanin hyperpigmentation: Secondary | ICD-10-CM | POA: Diagnosis not present

## 2022-07-24 DIAGNOSIS — N898 Other specified noninflammatory disorders of vagina: Secondary | ICD-10-CM

## 2022-07-24 NOTE — Progress Notes (Signed)
Acute Office Visit   Patient: Jessica Brooks   DOB: [birth year]   72 y.o. Female  MRN: [id number] Visit Date: 07/24/2022  Today's healthcare provider: Dani Gobble Shreyan Hinz, PA-C  Introduced myself to the patient as a Journalist, newspaper and provided education on APPs in clinical practice.    Chief Complaint  Patient presents with   Darking of skin in groin area    Has had  for over 6 months, patient was too ashamed to come earlier   Subjective    HPI HPI     Darking of skin in groin area    Additional comments: Has had  for over 6 months, patient was too ashamed to come earlier      Last edited by Eeva Schlosser, Dani Gobble, PA-C on 07/24/2022  1:51 PM.       Skin changes  Onset: gradual  Duration: about 6 months  Location: lower abdomen and groin area  Spreading: she thinks it is getting bigger  Itching: no but sometimes skin seems to tingle  Dryness: no, she states area feels more damp and constantly moist compared to the rest of her skin  Pain or tenderness: denies pain or tenderness She denies similar symptoms in the past but has had trichomonas in the past year.  Associated symptoms: reports when she wipes the area, it looks like she is wiping skin off, reports a smell while showering   Interventions: Jock itch creams, medicated powders, alcohol  Alleviating: nothing  Aggravating: nothing     Medications: Outpatient Medications Prior to Visit  Medication Sig   albuterol (VENTOLIN HFA) 108 (90 Base) MCG/ACT inhaler Inhale 2 puffs into the lungs every 6 (six) hours as needed for wheezing or shortness of breath.   amLODipine (NORVASC) 5 MG tablet Take 1 tablet (5 mg total) by mouth daily.   Cholecalciferol (VITAMIN D PO) Take 1 capsule by mouth daily.    levothyroxine (SYNTHROID) 100 MCG tablet Take 100 mcg by mouth daily before breakfast.   lisinopril (ZESTRIL) 5 MG tablet TAKE 1 TABLET EVERY DAY   meloxicam (MOBIC) 15 MG tablet TAKE 1 TABLET EVERY DAY   metoprolol succinate  (TOPROL-XL) 25 MG 24 hr tablet Take 1 tablet (25 mg total) by mouth daily.   montelukast (SINGULAIR) 10 MG tablet TAKE 1 TABLET AT BEDTIME   mupirocin ointment (BACTROBAN) 2 % APPLY TOPICALLY 2 TIMES DAILY   rosuvastatin (CRESTOR) 10 MG tablet TAKE 1 TABLET EVERY DAY   No facility-administered medications prior to visit.    Review of Systems  Skin:  Positive for color change.       Skin darkening of lower abdomen and groin area.        Objective    BP 131/83   Pulse 79   Temp 98.4 F (36.9 C) (Oral)   Ht 5' 5"$  (1.651 m)   Wt 241 lb 6.4 oz (109.5 kg)   SpO2 98%   BMI 40.17 kg/m    Physical Exam Vitals reviewed.  Constitutional:      General: She is awake.     Appearance: Normal appearance. She is well-developed and well-groomed.  HENT:     Head: Normocephalic and atraumatic.  Abdominal:     General: Abdomen is protuberant.     Palpations: Abdomen is soft.  Genitourinary:    General: Normal vulva.     Pubic Area: No rash.      Labia:  Right: No rash or lesion.        Left: No rash or lesion.      Vagina: No vaginal discharge, tenderness or lesions.     Comments: Skin darkening along labia majora and inner thighs  No apparent evidence of rash or skin breakdown to visual exam  Skin:    General: Skin is warm and moist.       Neurological:     Mental Status: She is alert.  Psychiatric:        Behavior: Behavior is cooperative.       No results found for any visits on 07/24/22.  Assessment & Plan     Problem List Items Addressed This Visit   None Visit Diagnoses     Darkening of skin    -  Primary Chronic, ongoing for about 6 months per patient Suspect skin darkening is likely secondary to skin rubbing and friction given areas No observed rash or erythema noted on exam  Reviewed this with patient and recommend she discontinue scrubbing and abrasive topical applications such as alcohol or harsh cleansers as this is likely irritating skin  further Reviewed keeping area clean and dry with gentle soap and warm water, can use a bit of corn starch or talc-free powder to assist with moisture control in areas  Will check for BV, trich, and yeast today due to concerns for odor and what sounds like changes to vaginal discharge. Results to dictate further management Follow up as needed    Problematic vaginal discharge       Relevant Orders   WET PREP FOR [place], YEAST, CLUE        No follow-ups on file.   I, Areona Homer E Mickle Campton, PA-C, have reviewed all documentation for this visit. The documentation on 07/25/22 for the exam, diagnosis, procedures, and orders are all accurate and complete.   Talitha Givens, MHS, PA-C [facility]    No follow-ups on file.

## 2022-07-24 NOTE — Patient Instructions (Signed)
At this time I recommend the following:  Please avoid using harsh cleansers or soaps in the area Do not apply alcohol or other chemicals to the skin Try to use a fragrance free, dye free soap with warm water to gently cleanse the area and keep it dry  You can use a bit of corn starch or talc-free powder to help with dryness Please try not to scrub or irritate the skin with excessive cleaning or wiping Make sure you are using natural fiber underwear ( cotton, linen, etc) as this is usually more breathable and prevents excess moisture in the area.   We will keep you updated on the results of your testing once it comes back from the lab

## 2022-07-25 ENCOUNTER — Ambulatory Visit: Payer: Medicare HMO | Admitting: Family Medicine

## 2022-07-28 LAB — SPECIMEN STATUS REPORT

## 2022-07-28 LAB — WET PREP FOR TRICH, YEAST, CLUE

## 2022-07-29 ENCOUNTER — Ambulatory Visit: Payer: Medicare HMO | Admitting: Nurse Practitioner

## 2022-07-29 NOTE — Progress Notes (Signed)
Her testing was cancelled by the lab. If she would like to come in and try to do a self swab I can put the order in and she can do that. Otherwise she should continue with the measures we discussed during her apt.

## 2022-07-30 ENCOUNTER — Telehealth: Payer: Self-pay | Admitting: Nurse Practitioner

## 2022-07-30 NOTE — Telephone Encounter (Signed)
Copied from [place]. Topic: General - Other >> Jul 30, 2022  2:47 PM Chapman Fitch wrote: Reason for CRM: Pt would like a call to go over swab results / please advise

## 2022-07-31 ENCOUNTER — Other Ambulatory Visit: Payer: Self-pay | Admitting: Nurse Practitioner

## 2022-07-31 NOTE — Telephone Encounter (Signed)
Requested Prescriptions  Pending Prescriptions Disp Refills   metoprolol succinate (TOPROL-XL) 25 MG 24 hr tablet [Pharmacy Med Name: METOPROLOL SUCCINATE ER 25 MG Tablet Extended Release 24 Hour] 90 tablet 3    Sig: TAKE 1 TABLET EVERY DAY     Cardiovascular:  Beta Blockers Passed - 07/31/2022  2:46 PM      Passed - Last BP in normal range    BP Readings from Last 1 Encounters:  07/24/22 131/83         Passed - Last Heart Rate in normal range    Pulse Readings from Last 1 Encounters:  07/24/22 79         Passed - Valid encounter within last 6 months    Recent Outpatient Visits           1 week ago Darkening of skin   [place], Erin E, PA-C   4 months ago Morbid obesity with BMI of 40.0-44.9, adult ([place])   [place], Jolene T, NP   11 months ago Morbid obesity with BMI of 40.0-44.9, adult ([place])   [place], Henrine Screws T, NP   1 year ago Primary hypertension   [place], [place]ro[place] T, NP   1 year ago Morbid obesity with BMI of 40.0-44.9, adult ([place])   [place], Barbaraann Faster, NP       Future Appointments             In 2 months Cannady, Barbaraann Faster, NP [place], [facility]

## 2022-08-01 NOTE — Telephone Encounter (Signed)
Called patient to schedule lab appointment due to test cancellation. Patient states she does not wish to come in for another swab at this time.

## 2022-08-01 NOTE — Telephone Encounter (Signed)
Her tests were cancelled by the lab. She can schedule a lab apt to repeat the swab so we can determine what is causing her symptoms.

## 2022-09-09 ENCOUNTER — Other Ambulatory Visit: Payer: Self-pay | Admitting: Nurse Practitioner

## 2022-09-09 NOTE — Telephone Encounter (Signed)
Requested Prescriptions  Pending Prescriptions Disp Refills   meloxicam (MOBIC) 15 MG tablet [Pharmacy Med Name: MELOXICAM 15 MG Tablet] 90 tablet 3    Sig: TAKE 1 TABLET EVERY DAY     Analgesics:  COX2 Inhibitors Failed - 09/09/2022  1:06 AM      Failed - Manual Review: Labs are only required if the patient has taken medication for more than 8 weeks.      Failed - HGB in normal range and within 360 days    Hemoglobin  Date Value Ref Range Status  08/14/2021 12.8 11.1 - 15.9 g/dL Final         Failed - HCT in normal range and within 360 days    Hematocrit  Date Value Ref Range Status  08/14/2021 40.5 34.0 - 46.6 % Final         Passed - Cr in normal range and within 360 days    Creatinine, Ser  Date Value Ref Range Status  03/28/2022 0.61 0.57 - 1.00 mg/dL Final         Passed - AST in normal range and within 360 days    AST  Date Value Ref Range Status  03/28/2022 18 0 - 40 IU/L Final         Passed - ALT in normal range and within 360 days    ALT  Date Value Ref Range Status  03/28/2022 18 0 - 32 IU/L Final         Passed - eGFR is 30 or above and within 360 days    GFR calc Af Amer  Date Value Ref Range Status  08/07/2020 102 >59 mL/min/1.73 Final    Comment:    **In accordance with recommendations from the NKF-ASN Task force,**   [facility] is in the process of updating its eGFR calculation to the   2021 CKD-EPI creatinine equation that estimates kidney function   without a race variable.    GFR, Estimated  Date Value Ref Range Status  11/16/2020 >60 >60 mL/min Final    Comment:    (NOTE) Calculated using the CKD-EPI Creatinine Equation (2021)    eGFR  Date Value Ref Range Status  03/28/2022 95 >59 mL/min/1.73 Final         Passed - Patient is not pregnant      Passed - Valid encounter within last 12 months    Recent Outpatient Visits           1 month ago Darkening of skin   [place], PA-C   5 months ago  Morbid obesity with BMI of 40.0-44.9, adult ([place])   [place], Henrine Screws T, NP   1 year ago Morbid obesity with BMI of 40.0-44.9, adult ([place])   [place], Henrine Screws T, NP   1 year ago Primary hypertension   [place], [place] T, NP   1 year ago Morbid obesity with BMI of 40.0-44.9, adult ([place])   [place], Barbaraann Faster, NP       Future Appointments             In 3 weeks Cannady, Barbaraann Faster, NP [place], [facility]             amLODipine (NORVASC) 5 MG tablet [Pharmacy Med Name: AMLODIPINE BESYLATE 5 MG Tablet] 90 tablet 3    Sig: TAKE 1 TABLET  EVERY DAY     Cardiovascular: Calcium Channel Blockers 2 Passed - 09/09/2022  1:06 AM      Passed - Last BP in normal range    BP Readings from Last 1 Encounters:  07/24/22 131/83         Passed - Last Heart Rate in normal range    Pulse Readings from Last 1 Encounters:  07/24/22 79         Passed - Valid encounter within last 6 months    Recent Outpatient Visits           1 month ago Darkening of skin   [place], Erin E, PA-C   5 months ago Morbid obesity with BMI of 40.0-44.9, adult ([place])   [place], Henrine Screws T, NP   1 year ago Morbid obesity with BMI of 40.0-44.9, adult ([place])   [place], Henrine Screws T, NP   1 year ago Primary hypertension   [place], [place] T, NP   1 year ago Morbid obesity with BMI of 40.0-44.9, adult ([place])   [place], Barbaraann Faster, NP       Future Appointments             In 3 weeks Cannady, Barbaraann Faster, NP [place], [facility]             montelukast (SINGULAIR) 10 MG tablet [Pharmacy Med Name: MONTELUKAST SODIUM 10 MG Tablet] 90 tablet 1    Sig: TAKE 1 TABLET AT  BEDTIME     Pulmonology:  Leukotriene Inhibitors Passed - 09/09/2022  1:06 AM      Passed - Valid encounter within last 12 months    Recent Outpatient Visits           1 month ago Darkening of skin   [place], Erin E, PA-C   5 months ago Morbid obesity with BMI of 40.0-44.9, adult ([place])   [place], [place] T, NP   1 year ago Morbid obesity with BMI of 40.0-44.9, adult ([place])   [place], Henrine Screws T, NP   1 year ago Primary hypertension   [place], [place] T, NP   1 year ago Morbid obesity with BMI of 40.0-44.9, adult ([place])   [place], Barbaraann Faster, NP       Future Appointments             In 3 weeks Cannady, Barbaraann Faster, NP [place], [facility]

## 2022-09-09 NOTE — Telephone Encounter (Signed)
Requested medication (s) are due for refill today: Due 09/30/22  Requested medication (s) are on the active medication list: yes    Last refill: 07/01/22  #90  0 refills  Future visit scheduled yes 09/30/22  Notes to clinic:Failed due to labs, please refill. Thank you  Requested Prescriptions  Pending Prescriptions Disp Refills   meloxicam (MOBIC) 15 MG tablet [Pharmacy Med Name: MELOXICAM 15 MG Tablet] 90 tablet 3    Sig: TAKE 1 TABLET EVERY DAY     Analgesics:  COX2 Inhibitors Failed - 09/09/2022  1:06 AM      Failed - Manual Review: Labs are only required if the patient has taken medication for more than 8 weeks.      Failed - HGB in normal range and within 360 days    Hemoglobin  Date Value Ref Range Status  08/14/2021 12.8 11.1 - 15.9 g/dL Final         Failed - HCT in normal range and within 360 days    Hematocrit  Date Value Ref Range Status  08/14/2021 40.5 34.0 - 46.6 % Final         Passed - Cr in normal range and within 360 days    Creatinine, Ser  Date Value Ref Range Status  03/28/2022 0.61 0.57 - 1.00 mg/dL Final         Passed - AST in normal range and within 360 days    AST  Date Value Ref Range Status  03/28/2022 18 0 - 40 IU/L Final         Passed - ALT in normal range and within 360 days    ALT  Date Value Ref Range Status  03/28/2022 18 0 - 32 IU/L Final         Passed - eGFR is 30 or above and within 360 days    GFR calc Af Amer  Date Value Ref Range Status  08/07/2020 102 >59 mL/min/1.73 Final    Comment:    **In accordance with recommendations from the NKF-ASN Task force,**   [facility] is in the process of updating its eGFR calculation to the   2021 CKD-EPI creatinine equation that estimates kidney function   without a race variable.    GFR, Estimated  Date Value Ref Range Status  11/16/2020 >60 >60 mL/min Final    Comment:    (NOTE) Calculated using the CKD-EPI Creatinine Equation (2021)    eGFR  Date Value Ref Range Status   03/28/2022 95 >59 mL/min/1.73 Final         Passed - Patient is not pregnant      Passed - Valid encounter within last 12 months    Recent Outpatient Visits           1 month ago Darkening of skin   [place], PA-C   5 months ago Morbid obesity with BMI of 40.0-44.9, adult ([place])   [place], Henrine Screws T, NP   1 year ago Morbid obesity with BMI of 40.0-44.9, adult ([place])   [place], Henrine Screws T, NP   1 year ago Primary hypertension   [place], [place] T, NP   1 year ago Morbid obesity with BMI of 40.0-44.9, adult [facility])   [place] Venita Lick, NP       Future Appointments  In 3 weeks Cannady, Henrine Screws T, NP [place], [facility]             amLODipine (NORVASC) 5 MG tablet [Pharmacy Med Name: AMLODIPINE BESYLATE 5 MG Tablet] 90 tablet 3    Sig: TAKE 1 TABLET EVERY DAY     Cardiovascular: Calcium Channel Blockers 2 Passed - 09/09/2022  1:06 AM      Passed - Last BP in normal range    BP Readings from Last 1 Encounters:  07/24/22 131/83         Passed - Last Heart Rate in normal range    Pulse Readings from Last 1 Encounters:  07/24/22 79         Passed - Valid encounter within last 6 months    Recent Outpatient Visits           1 month ago Darkening of skin   [place], Erin E, PA-C   5 months ago Morbid obesity with BMI of 40.0-44.9, adult ([place])   [place], Henrine Screws T, NP   1 year ago Morbid obesity with BMI of 40.0-44.9, adult ([place])   [place], Henrine Screws T, NP   1 year ago Primary hypertension   [place], [place] T, NP   1 year ago Morbid obesity with BMI of 40.0-44.9, adult ([place])   [place]  [place], Barbaraann Faster, NP       Future Appointments             In 3 weeks Cannady, Barbaraann Faster, NP [place], [facility]            Signed Prescriptions Disp Refills   montelukast (SINGULAIR) 10 MG tablet 90 tablet 1    Sig: TAKE 1 TABLET AT BEDTIME     Pulmonology:  Leukotriene Inhibitors Passed - 09/09/2022  1:06 AM      Passed - Valid encounter within last 12 months    Recent Outpatient Visits           1 month ago Darkening of skin   [place], Erin E, PA-C   5 months ago Morbid obesity with BMI of 40.0-44.9, adult ([place])   [place], [place] T, NP   1 year ago Morbid obesity with BMI of 40.0-44.9, adult ([place])   [place], Henrine Screws T, NP   1 year ago Primary hypertension   [place], [place] T, NP   1 year ago Morbid obesity with BMI of 40.0-44.9, adult ([place])   [place], Barbaraann Faster, NP       Future Appointments             In 3 weeks Cannady, Barbaraann Faster, NP [place], [facility]

## 2022-09-27 NOTE — Patient Instructions (Signed)
Be Involved in Your Health Care:  Taking Medications When medications are taken as directed, they can greatly improve your health. But if they are not taken as instructed, they may not work. In some cases, not taking them correctly can be harmful. To help ensure your treatment remains effective and safe, understand your medications and how to take them.  Your lab results, notes and after visit summary will be available on My Chart. We strongly encourage you to use this feature. If lab results are abnormal the clinic will contact you with the appropriate steps. If the clinic does not contact you assume the results are satisfactory. You can always see your results on My Chart. If you have questions regarding your condition, please contact the clinic during office hours. You can also ask questions on My Chart.  We at [facility] are grateful that you chose us to provide care. We strive to provide excellent and compassionate care and are always looking for feedback. If you get a survey from the clinic please complete this.   Prediabetes Eating Plan Prediabetes is a condition that causes blood sugar (glucose) levels to be higher than normal. This increases the risk for developing type 2 diabetes (type 2 diabetes mellitus). Working with a health care provider or nutrition specialist (dietitian) to make diet and lifestyle changes can help prevent the onset of diabetes. These changes may help you: Control your blood glucose levels. Improve your cholesterol levels. Manage your blood pressure. What are tips for following this plan? Reading food labels Read food labels to check the amount of fat, salt (sodium), and sugar in prepackaged foods. Avoid foods that have: Saturated fats. Trans fats. Added sugars. Avoid foods that have more than 300 milligrams (mg) of sodium per serving. Limit your sodium intake to less than 2,300 mg each day. Shopping Avoid buying pre-made and processed foods. Avoid  buying drinks with added sugar. Cooking Cook with olive oil. Do not use butter, lard, or ghee. Bake, broil, grill, steam, or boil foods. Avoid frying. Meal planning  Work with your dietitian to create an eating plan that is right for you. This may include tracking how many calories you take in each day. Use a food diary, notebook, or mobile application to track what you eat at each meal. Consider following a Mediterranean diet. This includes: Eating several servings of fresh fruits and vegetables each day. Eating fish at least twice a week. Eating one serving each day of whole grains, beans, nuts, and seeds. Using olive oil instead of other fats. Limiting alcohol. Limiting red meat. Using nonfat or low-fat dairy products. Consider following a plant-based diet. This includes dietary choices that focus on eating mostly vegetables and fruit, grains, beans, nuts, and seeds. If you have high blood pressure, you may need to limit your sodium intake or follow a diet such as the DASH (Dietary Approaches to Stop Hypertension) eating plan. The DASH diet aims to lower high blood pressure. Lifestyle Set weight loss goals with help from your health care team. It is recommended that most people with prediabetes lose 7% of their body weight. Exercise for at least 30 minutes 5 or more days a week. Attend a support group or seek support from a mental health counselor. Take over-the-counter and prescription medicines only as told by your health care provider. What foods are recommended? Fruits Berries. Bananas. Apples. Oranges. Grapes. Papaya. Mango. Pomegranate. Kiwi. Grapefruit. Cherries. Vegetables Lettuce. Spinach. Peas. Beets. Cauliflower. Cabbage. Broccoli. Carrots. Tomatoes. Squash. Eggplant. Herbs.   Peppers. Onions. Cucumbers. Brussels sprouts. Grains Whole grains, such as whole-wheat or whole-grain breads, crackers, cereals, and pasta. Unsweetened oatmeal. Bulgur. Barley. Quinoa. Brown rice. Corn  or whole-wheat flour tortillas or taco shells. Meats and other proteins Seafood. Poultry without skin. Lean cuts of pork and beef. Tofu. Eggs. Nuts. Beans. Dairy Low-fat or fat-free dairy products, such as yogurt, cottage cheese, and cheese. Beverages Water. Tea. Coffee. Sugar-free or diet soda. Seltzer water. Low-fat or nonfat milk. Milk alternatives, such as soy or almond milk. Fats and oils Olive oil. Canola oil. Sunflower oil. Grapeseed oil. Avocado. Walnuts. Sweets and desserts Sugar-free or low-fat pudding. Sugar-free or low-fat ice cream and other frozen treats. Seasonings and condiments Herbs. Sodium-free spices. Mustard. Relish. Low-salt, low-sugar ketchup. Low-salt, low-sugar barbecue sauce. Low-fat or fat-free mayonnaise. The items listed above may not be a complete list of recommended foods and beverages. Contact a dietitian for more information. What foods are not recommended? Fruits Fruits canned with syrup. Vegetables Canned vegetables. Frozen vegetables with butter or cream sauce. Grains Refined white flour and flour products, such as bread, pasta, snack foods, and cereals. Meats and other proteins Fatty cuts of meat. Poultry with skin. Breaded or fried meat. Processed meats. Dairy Full-fat yogurt, cheese, or milk. Beverages Sweetened drinks, such as iced tea and soda. Fats and oils Butter. Lard. Ghee. Sweets and desserts Baked goods, such as cake, cupcakes, pastries, cookies, and cheesecake. Seasonings and condiments Spice mixes with added salt. Ketchup. Barbecue sauce. Mayonnaise. The items listed above may not be a complete list of foods and beverages that are not recommended. Contact a dietitian for more information. Where to find more information American Diabetes Association: www.diabetes.org Summary You may need to make diet and lifestyle changes to help prevent the onset of diabetes. These changes can help you control blood sugar, improve cholesterol  levels, and manage blood pressure. Set weight loss goals with help from your health care team. It is recommended that most people with prediabetes lose 7% of their body weight. Consider following a Mediterranean diet. This includes eating plenty of fresh fruits and vegetables, whole grains, beans, nuts, seeds, fish, and low-fat dairy, and using olive oil instead of other fats. This information is not intended to replace advice given to you by your health care provider. Make sure you discuss any questions you have with your health care provider. Document Revised: 09/01/2019 Document Reviewed: 09/01/2019 Elsevier Patient Education  2023 [employer]  

## 2022-09-30 ENCOUNTER — Ambulatory Visit (INDEPENDENT_AMBULATORY_CARE_PROVIDER_SITE_OTHER): Payer: Medicare HMO | Admitting: Nurse Practitioner

## 2022-09-30 ENCOUNTER — Encounter: Payer: Self-pay | Admitting: Nurse Practitioner

## 2022-09-30 VITALS — BP 139/86 | HR 93 | Temp 98.2°F | Ht 65.0 in | Wt 238.7 lb

## 2022-09-30 DIAGNOSIS — F418 Other specified anxiety disorders: Secondary | ICD-10-CM

## 2022-09-30 DIAGNOSIS — R7301 Impaired fasting glucose: Secondary | ICD-10-CM

## 2022-09-30 DIAGNOSIS — I10 Essential (primary) hypertension: Secondary | ICD-10-CM

## 2022-09-30 DIAGNOSIS — E890 Postprocedural hypothyroidism: Secondary | ICD-10-CM | POA: Diagnosis not present

## 2022-09-30 DIAGNOSIS — Z0000 Encounter for general adult medical examination without abnormal findings: Secondary | ICD-10-CM | POA: Diagnosis not present

## 2022-09-30 DIAGNOSIS — E782 Mixed hyperlipidemia: Secondary | ICD-10-CM

## 2022-09-30 DIAGNOSIS — Z6839 Body mass index (BMI) 39.0-39.9, adult: Secondary | ICD-10-CM

## 2022-09-30 DIAGNOSIS — J301 Allergic rhinitis due to pollen: Secondary | ICD-10-CM

## 2022-09-30 DIAGNOSIS — J4520 Mild intermittent asthma, uncomplicated: Secondary | ICD-10-CM

## 2022-09-30 DIAGNOSIS — Z8673 Personal history of transient ischemic attack (TIA), and cerebral infarction without residual deficits: Secondary | ICD-10-CM

## 2022-09-30 DIAGNOSIS — M85851 Other specified disorders of bone density and structure, right thigh: Secondary | ICD-10-CM

## 2022-09-30 DIAGNOSIS — I440 Atrioventricular block, first degree: Secondary | ICD-10-CM | POA: Diagnosis not present

## 2022-09-30 LAB — MICROALBUMIN, URINE WAIVED
Creatinine, Urine Waived: 10 mg/dL (ref 10–300)
Microalb, Ur Waived: 10 mg/L (ref 0–19)

## 2022-09-30 MED ORDER — METOPROLOL SUCCINATE ER 50 MG PO TB24: 50.0000 mg | ORAL_TABLET | Freq: Every day | ORAL | 4 refills | Status: AC

## 2022-09-30 MED ORDER — MONTELUKAST SODIUM 10 MG PO TABS: 10.0000 mg | ORAL_TABLET | Freq: Every day | ORAL | 4 refills | Status: AC

## 2022-09-30 MED ORDER — AMLODIPINE BESYLATE 5 MG PO TABS: 5.0000 mg | ORAL_TABLET | Freq: Every day | ORAL | 4 refills | Status: AC

## 2022-09-30 MED ORDER — SERTRALINE HCL 25 MG PO TABS
25.0000 mg | ORAL_TABLET | Freq: Every day | ORAL | 5 refills | Status: DC
Start: 2022-09-30 — End: 2023-03-03

## 2022-09-30 MED ORDER — ROSUVASTATIN CALCIUM 10 MG PO TABS: 10.0000 mg | ORAL_TABLET | Freq: Every day | ORAL | 4 refills | Status: AC

## 2022-09-30 MED ORDER — LISINOPRIL 5 MG PO TABS: 5.0000 mg | ORAL_TABLET | Freq: Every day | ORAL | 4 refills | Status: AC

## 2022-09-30 NOTE — Assessment & Plan Note (Signed)
Chronic, ongoing.  Followed by endo, continue this collaboration.  Recent note and labs reviewed.  TSH and Free T4 today.

## 2022-09-30 NOTE — Assessment & Plan Note (Signed)
Chronic, ongoing. Continue Crestor as is tolerating without ADR, did not tolerate Atorvastatin in past.  Lipid panel today.  Return in 6 months. ?

## 2022-09-30 NOTE — Assessment & Plan Note (Signed)
New onset related to her daughter living with her, daughter recently got out of jail.  Suspect this is causing exacerbation in her tachycardia episodes.  She denies SI/HI.  Discussed at length with patient treatment options, including no treatment.  She would like to trial Sertraline, will start with 25 MG daily and adjust as needed.  Educated her on medication use and side effects + BLACK BOX warning.  Return to office in 4 weeks.

## 2022-09-30 NOTE — Assessment & Plan Note (Signed)
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  At this time recommend tight BP control and LDL control to prevent further incidents.  Recheck lipid panel today.

## 2022-09-30 NOTE — Assessment & Plan Note (Addendum)
Noted on past labs, check A1c today.  Continue diet and exercise focus.

## 2022-09-30 NOTE — Assessment & Plan Note (Signed)
Chronic, stable.  Minimal Albuterol use. Continue current medication regimen and adjust as needed.  Spirometry annually.  Return in 6 months. 

## 2022-09-30 NOTE — Assessment & Plan Note (Addendum)
Chronic, ongoing.  DEXA scan 05/06/2021 reviewed, BMD measured at Femur Neck Right is 0.796 g/cm2 with a T-score of -1.7.  Continue daily supplements, check Vit D level today.  Repeat DEXA 05/06/2026.

## 2022-09-30 NOTE — Assessment & Plan Note (Signed)
BMI 39.72 with underlying HTN and Asthma. Recommended eating smaller high protein, low fat meals more frequently and exercising 30 mins a day 5 times a week with a goal of 10-15lb weight loss in the next 3 months. Patient voiced their understanding and motivation to adhere to these recommendations.

## 2022-09-30 NOTE — Assessment & Plan Note (Addendum)
Chronic with some recent exacerbation of symptoms, ?related to stressors.  seen by cardiology 07/31/21 and tolerating Metoprolol.  Will increase this as they recommend to 50 MG XL dosing, which may offer benefit to the tachycardia episodes she is experiencing recently.  Have gotten her scheduled to follow-up with cardiology 10/06/22 for further recommendations due to recent diaphoresis and rapid HR episodes.  Return to office in 4 weeks.  If any CP or SOB presents immediately go to ER.

## 2022-09-30 NOTE — Assessment & Plan Note (Addendum)
Chronic, stable.  Continue current medication regimen and adjust as needed.  Refills on Singulair sent.

## 2022-09-30 NOTE — Progress Notes (Signed)
BP 139/86   Pulse 93   Temp 98.2 F (36.8 C) (Oral)   Ht 5\' 5"  (1.651 m)   Wt 238 lb 11.2 oz (108.3 kg)   SpO2 98%   BMI 39.72 kg/m    Subjective:    Patient ID: Jessica Brooks, female    DOB: 06-11-1951, 72 y.o.   MRN: [id number]  HPI: Jessica Brooks is a 72 y.o. female presenting on 09/30/2022 for comprehensive medical examination. Current medical complaints include:none  She currently lives with: self Menopausal Symptoms: no  Has underlying osteoarthritis knees bilateral, follows with ortho and last saw 2 weeks.    HYPERTENSION / HYPERLIPIDEMIA Continues on Amlodipine, Metoprolol, and Lisinopril.  Has 1st Degree AV Block, last saw cardiology 07/31/21 -- had 7 day monitor at time.  They recommended Metoprolol increase to 50 MG, she is still on 25 MG.    Reports having recent episodes, x 4 over the past month, of diaphoresis at night with rapid heart rate + mild headache -- denies CP or SOB with this.  Having some increased stressors as daughter recently out of jail and living with her.   Satisfied with current treatment? yes Duration of hypertension: chronic BP monitoring frequency: weekly BP range: 139/70-80  BP medication side effects: no Past BP meds:  Duration of hyperlipidemia: chronic Cholesterol medication side effects: no Cholesterol supplements: none Past cholesterol medications:  Medication compliance: excellent compliance Aspirin: no Recent stressors: no Recurrent headaches: no Visual changes: no Palpitations: yes Dyspnea: no Chest pain: no Lower extremity edema: no Dizzy/lightheaded: no   PREDIABETES A1c one year ago 5.1%. Polydipsia/polyuria: no Visual disturbance: no Chest pain: no Paresthesias: no  HYPOTHYROIDISM Last saw endocrinology -- 01/20/22.  History of ablation many years ago. Thyroid control status:stable Satisfied with current treatment? yes Medication side effects: no Medication compliance: good compliance Etiology of  hypothyroidism: postablative Recent dose adjustment:no Fatigue: a little bit due to knees Cold intolerance: no Heat intolerance: no Weight gain: no Weight loss: no Constipation: no Diarrhea/loose stools: no Palpitations: yes Lower extremity edema: no Anxiety/depressed mood: no   OSTEOPENIA She continues on Vitamin D daily and calcium supplement.  Last DEXA November 2022.  Satisfied with current treatment?: yes Medication side effects: no Adequate calcium & vitamin D: yes Weight bearing exercises: no   ASTHMA Taking Singulair every night and Albuterol as needed.   Asthma status: stable Satisfied with current treatment?: yes Albuterol/rescue inhaler frequency: once a week Dyspnea frequency: no Wheezing frequency: no Cough frequency: no Nocturnal symptom frequency: no Limitation of activity: no Current upper respiratory symptoms: no Triggers: pollen Home peak flows: none Last Spirometry: unknown Failed/intolerant to following asthma meds: none Asthma meds in past: as above Aerochamber/spacer use: no Visits to ER or Urgent Care in past year: no Pneumovax: Up to Date Influenza: Up to Date      09/30/2022   10:11 AM 07/24/2022    2:27 PM 03/28/2022    3:46 PM 03/04/2022   11:44 AM 08/14/2021    9:56 AM  Depression screen PHQ 2/9  Decreased Interest 2 0 0 0 0  Down, Depressed, Hopeless 0 0 0 0 0  PHQ - 2 Score 2 0 0 0 0  Altered sleeping 2 1 2 2 1   Tired, decreased energy 2 1 0 1 1  Change in appetite 1 0 0 0 0  Feeling bad or failure about yourself  0 0 0 0 0  Trouble concentrating 0 0 0 0 0  Moving  slowly or fidgety/restless 1 1 0 0 0  Suicidal thoughts 0 0 0 0 0  PHQ-9 Score Difficult doing work/chores Not difficult at all Not difficult at all Not difficult at all Not difficult at all        09/30/2022   10:12 AM 07/24/2022    2:27 PM 03/28/2022    3:47 PM 08/14/2021    9:56 AM  GAD 7 : Generalized Anxiety Score  Nervous, Anxious, on Edge 0 0 0 0   Control/stop worrying 0 0 1 0  Worry too much - different things 0 0 0 0  Trouble relaxing 1 1 0 0  Restless 0 0 0 0  Easily annoyed or irritable 0 0 0 0  Afraid - awful might happen 0 0 0 0  Total GAD 7 Score 0  Anxiety Difficulty Not difficult at all Not difficult at all Not difficult at all         07/24/2021   10:27 AM 10/17/2021    7:45 AM 03/04/2022   11:37 AM 03/28/2022    3:46 PM 07/24/2022    2:26 PM  Fall Risk  Falls in the past year? 0  0 0 0  Was there an injury with Fall? 0  0 0 0  Fall Risk Category Calculator 0  0 0 0  Fall Risk Category (Retired) Low  Low Low   (RETIRED) Patient Fall Risk Level Low fall risk Moderate fall risk Moderate fall risk Low fall risk   Patient at Risk for Falls Due to No Fall Risks  Impaired mobility No Fall Risks No Fall Risks  Fall risk Follow up Falls evaluation completed  Falls evaluation completed;Education provided;Falls prevention discussed Falls evaluation completed Falls evaluation completed    Functional Status Survey: Is the patient deaf or have difficulty hearing?: No Does the patient have difficulty seeing, even when wearing glasses/contacts?: No Does the patient have difficulty concentrating, remembering, or making decisions?: No Does the patient have difficulty walking or climbing stairs?: No Does the patient have difficulty dressing or bathing?: No Does the patient have difficulty doing errands alone such as visiting a doctor's office or shopping?: No    Past Medical History:  Past Medical History:  Diagnosis Date   Arthritis    Asthma    uses inhaler   Cataract    Hyperlipidemia    Hypertension    Keloid     Surgical History:  Past Surgical History:  Procedure Laterality Date   ABDOMINAL HYSTERECTOMY     BREAST BIOPSY Right    needle bx years ago unsure of date   BREAST BIOPSY Right    another bx done by [facility]    CATARACT EXTRACTION W/PHACO Left 06/11/2017   Procedure: CATARACT EXTRACTION PHACO AND  INTRAOCULAR LENS PLACEMENT (IOC)-LEFT;  Surgeon: Lockie Mola, MD;  Location: [facility] ORS;  Service: Ophthalmology;  Laterality: Left;  LOT # [id number] H [place] :34.4 AP 15.3% CDE 5.29   COLONOSCOPY WITH PROPOFOL N/A 10/17/2021   Procedure: COLONOSCOPY WITH PROPOFOL;  Surgeon: Wyline Mood, MD;  Location: [facility];  Service: Gastroenterology;  Laterality: N/A;   KELOID EXCISION N/A    KELOID REMOVAL TO NAVAL IN LATE 1980's   MULTIPLE TOOTH EXTRACTIONS      Medications:  Current Outpatient Medications on File Prior to Visit  Medication Sig   albuterol (VENTOLIN HFA) 108 (90 Base) MCG/ACT inhaler Inhale 2 puffs into the lungs every 6 (six) hours as  needed for wheezing or shortness of breath.   Cholecalciferol (VITAMIN D PO) Take 1 capsule by mouth daily.    levothyroxine (SYNTHROID) 100 MCG tablet Take 100 mcg by mouth daily before breakfast.   meloxicam (MOBIC) 15 MG tablet TAKE 1 TABLET EVERY DAY   mupirocin ointment (BACTROBAN) 2 % APPLY TOPICALLY 2 TIMES DAILY   No current facility-administered medications on file prior to visit.    Allergies:  Allergies  Allergen Reactions   Hydrochlorothiazide Palpitations    Jittery    Social History:  Social History   Socioeconomic History   Marital status: Divorced    Spouse name: Not on file   Number of children: Not on file   Years of education: Not on file   Highest education level: Not on file  Occupational History   Not on file  Tobacco Use   Smoking status: Former    Types: Cigarettes    Quit date: 06/16/1978    Years since quitting: 44.3   Smokeless tobacco: Never  Vaping Use   Vaping Use: Never used  Substance and Sexual Activity   Alcohol use: No   Drug use: No   Sexual activity: Never  Other Topics Concern   Not on file  Social History Narrative   Not on file   Social Determinants of Health   Financial Resource Strain: Low Risk  (03/04/2022)   Overall Financial Resource Strain (CARDIA)    Difficulty of  Paying Living Expenses: Not hard at all  Food Insecurity: No Food Insecurity (03/04/2022)   Hunger Vital Sign    Worried About Running Out of Food in the Last Year: Never true    Ran Out of Food in the Last Year: Never true  Transportation Needs: No Transportation Needs (03/04/2022)   PRAPARE - Administrator, Civil Service (Medical): No    Lack of Transportation (Non-Medical): No  Physical Activity: Insufficiently Active (03/04/2022)   Exercise Vital Sign    Days of Exercise per Week: 3 days    Minutes of Exercise per Session: 30 min  Stress: No Stress Concern Present (03/04/2022)   [employer] of Occupational Health - Occupational Stress Questionnaire    Feeling of Stress : Only a little  Social Connections: Moderately Integrated (03/04/2022)   Social Connection and Isolation Panel [NHANES]    Frequency of Communication with Friends and Family: More than three times a week    Frequency of Social Gatherings with Friends and Family: Once a week    Attends Religious Services: More than 4 times per year    Active Member of [employer] or Organizations: Yes    Attends Engineer, structural: More than 4 times per year    Marital Status: Divorced  Intimate Partner Violence: Not At Risk (03/04/2022)   Humiliation, Afraid, Rape, and Kick questionnaire    Fear of Current or Ex-Partner: No    Emotionally Abused: No    Physically Abused: No    Sexually Abused: No   Social History   Tobacco Use  Smoking Status Former   Types: Cigarettes   Quit date: 06/16/1978   Years since quitting: 44.3  Smokeless Tobacco Never   Social History   Substance and Sexual Activity  Alcohol Use No    Family History:  Family History  Problem Relation Age of Onset   Diabetes Mother    Hypertension Mother    Cancer Father        throat   Diabetes Sister  Kidney disease Sister    Diabetes Brother    Kidney disease Brother    Breast cancer Neg Hx     Past medical history, surgical  history, medications, allergies, family history and social history reviewed with patient today and changes made to appropriate areas of the chart.   ROS All other ROS negative except what is listed above and in the HPI.      Objective:    BP 139/86   Pulse 93   Temp 98.2 F (36.8 C) (Oral)   Ht 5\' 5"  (1.651 m)   Wt 238 lb 11.2 oz (108.3 kg)   SpO2 98%   BMI 39.72 kg/m   Wt Readings from Last 3 Encounters:  09/30/22 238 lb 11.2 oz (108.3 kg)  07/24/22 241 lb 6.4 oz (109.5 kg)  03/28/22 242 lb (109.8 kg)    Physical Exam Vitals and nursing note reviewed. Exam conducted with a chaperone present.  Constitutional:      General: She is awake. She is not in acute distress.    Appearance: She is well-developed and well-groomed. She is obese. She is not ill-appearing or toxic-appearing.  HENT:     Head: Normocephalic and atraumatic.     Right Ear: Hearing, tympanic membrane, ear canal and external ear normal. No drainage.     Left Ear: Hearing, tympanic membrane, ear canal and external ear normal. No drainage.     Nose: Nose normal.     Right Sinus: No maxillary sinus tenderness or frontal sinus tenderness.     Left Sinus: No maxillary sinus tenderness or frontal sinus tenderness.     Mouth/Throat:     Mouth: Mucous membranes are moist.     Pharynx: Oropharynx is clear. Uvula midline. No pharyngeal swelling, oropharyngeal exudate or posterior oropharyngeal erythema.  Eyes:     General: Lids are normal.        Right eye: No discharge.        Left eye: No discharge.     Extraocular Movements: Extraocular movements intact.     Conjunctiva/sclera: Conjunctivae normal.     Pupils: Pupils are equal, round, and reactive to light.     Visual Fields: Right eye visual fields normal and left eye visual fields normal.  Neck:     Thyroid: No thyromegaly.     Vascular: No carotid bruit.     Trachea: Trachea normal.  Cardiovascular:     Rate and Rhythm: Normal rate and regular rhythm.      Heart sounds: Normal heart sounds. No murmur heard.    No gallop.  Pulmonary:     Effort: Pulmonary effort is normal. No accessory muscle usage or respiratory distress.     Breath sounds: Normal breath sounds.  Chest:  Breasts:    Right: Normal.     Left: Normal.  Abdominal:     General: Bowel sounds are normal.     Palpations: Abdomen is soft. There is no hepatomegaly or splenomegaly.     Tenderness: There is no abdominal tenderness.  Musculoskeletal:        General: Normal range of motion.     Cervical back: Normal range of motion and neck supple.     Right lower leg: No edema.     Left lower leg: No edema.  Lymphadenopathy:     Head:     Right side of head: No submental, submandibular, tonsillar, preauricular or posterior auricular adenopathy.     Left side of head: No submental, submandibular, tonsillar,  preauricular or posterior auricular adenopathy.     Cervical: No cervical adenopathy.     Upper Body:     Right upper body: No supraclavicular, axillary or pectoral adenopathy.     Left upper body: No supraclavicular, axillary or pectoral adenopathy.  Skin:    General: Skin is warm and dry.     Capillary Refill: Capillary refill takes less than 2 seconds.     Findings: No rash.  Neurological:     Mental Status: She is alert and oriented to person, place, and time.     Gait: Gait is intact.     Deep Tendon Reflexes: Reflexes are normal and symmetric.     Reflex Scores:      Brachioradialis reflexes are 2+ on the right side and 2+ on the left side.      Patellar reflexes are 2+ on the right side and 2+ on the left side. Psychiatric:        Attention and Perception: Attention normal.        Mood and Affect: Mood normal.        Speech: Speech normal.        Behavior: Behavior normal. Behavior is cooperative.        Thought Content: Thought content normal.        Judgment: Judgment normal.   EKG My review and personal interpretation at Time: 1053 Indication: palpitations   Rate: 86  Rhythm: sinus Axis: normal Other: No nonspecific st abn, no stemi, no lvh  Results for orders placed or performed in visit on 07/24/22  WET PREP FOR TRICH, YEAST, CLUE   Specimen: Sterile Swab   Sterile Swab  Result Value Ref Range   Trichomonas Exam CANCELED   Specimen status report  Result Value Ref Range   specimen status report Comment       Assessment & Plan:   Problem List Items Addressed This Visit       Cardiovascular and Mediastinum   1st degree AV block    Chronic with some recent exacerbation of symptoms, ?related to stressors.  seen by cardiology 07/31/21 and tolerating Metoprolol.  Will increase this as they recommend to 50 MG XL dosing, which may offer benefit to the tachycardia episodes she is experiencing recently.  Have gotten her scheduled to follow-up with cardiology 10/06/22 for further recommendations due to recent diaphoresis and rapid HR episodes.  Return to office in 4 weeks.  If any CP or SOB presents immediately go to ER.      Relevant Medications   metoprolol succinate (TOPROL-XL) 50 MG 24 hr tablet   rosuvastatin (CRESTOR) 10 MG tablet   lisinopril (ZESTRIL) 5 MG tablet   amLODipine (NORVASC) 5 MG tablet   Other Relevant Orders   Comprehensive metabolic panel   Lipid Panel w/o Chol/HDL Ratio   EKG 12-Lead (Completed)   Hypertension    Chronic, stable.  BP at goal in office and on home readings. Recommend she monitor BP at least a few mornings a week at home and document.  DASH diet at home.  Continue current medication regimen and adjust as needed.  Labs today: CBC, CMP, TSH, urine ALB. Urine ALB 24 September 2022.  Return in 6 months.       Relevant Medications   metoprolol succinate (TOPROL-XL) 50 MG 24 hr tablet   rosuvastatin (CRESTOR) 10 MG tablet   lisinopril (ZESTRIL) 5 MG tablet   amLODipine (NORVASC) 5 MG tablet   Other Relevant Orders   Microalbumin,  Urine Waived   Comprehensive metabolic panel     Respiratory   Allergic  rhinitis    Chronic, stable.  Continue current medication regimen and adjust as needed.  Refills on Singulair sent.      Relevant Orders   CBC with Differential/Platelet   Asthma    Chronic, stable.  Minimal Albuterol use. Continue current medication regimen and adjust as needed.  Spirometry annually.  Return in 6 months.      Relevant Medications   montelukast (SINGULAIR) 10 MG tablet   Other Relevant Orders   CBC with Differential/Platelet     Endocrine   IFG (impaired fasting glucose)    Noted on past labs, check A1c today.  Continue diet and exercise focus.      Relevant Orders   Comprehensive metabolic panel   HgB A1c   Postablative hypothyroidism    Chronic, ongoing.  Followed by endo, continue this collaboration.  Recent note and labs reviewed.  TSH and Free T4 today.      Relevant Medications   metoprolol succinate (TOPROL-XL) 50 MG 24 hr tablet   Other Relevant Orders   TSH   T4, free     Nervous and Auditory   Cerebral infarction, remote, resolved    Noted cerebellar remote left infarct on MRI 11/22/19 -- patient does not recall any history of such or any symptoms.  At this time recommend tight BP control and LDL control to prevent further incidents.  Recheck lipid panel today.      Relevant Orders   Comprehensive metabolic panel   Lipid Panel w/o Chol/HDL Ratio     Musculoskeletal and Integument   Osteopenia of neck of right femur    Chronic, ongoing.  DEXA scan 05/06/2021 reviewed, BMD measured at Femur Neck Right is 0.796 g/cm2 with a T-score of -1.7.  Continue daily supplements, check Vit D level today.  Repeat DEXA 05/06/2026.      Relevant Orders   VITAMIN D 25 Hydroxy (Vit-D Deficiency, Fractures)     Other   Mixed hyperlipidemia    Chronic, ongoing. Continue Crestor as is tolerating without ADR, did not tolerate Atorvastatin in past.  Lipid panel today.  Return in 6 months.      Relevant Medications   metoprolol succinate (TOPROL-XL) 50 MG 24 hr  tablet   rosuvastatin (CRESTOR) 10 MG tablet   lisinopril (ZESTRIL) 5 MG tablet   amLODipine (NORVASC) 5 MG tablet   Other Relevant Orders   Comprehensive metabolic panel   Lipid Panel w/o Chol/HDL Ratio   Obesity - Primary    BMI 39.72 with underlying HTN and Asthma. Recommended eating smaller high protein, low fat meals more frequently and exercising 30 mins a day 5 times a week with a goal of 10-15lb weight loss in the next 3 months. Patient voiced their understanding and motivation to adhere to these recommendations.       Situational anxiety    New onset related to her daughter living with her, daughter recently got out of jail.  Suspect this is causing exacerbation in her tachycardia episodes.  She denies SI/HI.  Discussed at length with patient treatment options, including no treatment.  She would like to trial Sertraline, will start with 25 MG daily and adjust as needed.  Educated her on medication use and side effects + BLACK BOX warning.  Return to office in 4 weeks.      Relevant Medications   sertraline (ZOLOFT) 25 MG tablet   Other Visit Diagnoses  Encounter for annual physical exam       Annual physical today with labs and health maintenance reviewed, discussed with patient.        Follow up plan: Return in about 4 weeks (around 10/28/2022) for HEART CHECK AND ANXIETY -- added Sertraline and increase Metoprolol XL.   LABORATORY TESTING:  - Pap smear: not applicable  IMMUNIZATIONS:   - Tdap: Tetanus vaccination status reviewed: last tetanus booster within 10 years. - Influenza: Up to date - Pneumovax: Up to date - Prevnar: Up to date - COVID: Up to date - HPV: Not applicable - Shingrix vaccine: Up to date  SCREENING: -Mammogram: Up to date 05/29/22 - Colonoscopy: Up To Date - Bone Density: Up to date 05/06/21 every 5 years -Hearing Test:  -Spirometry: Not applicable   PATIENT COUNSELING:   Advised to take 1 mg of folate supplement per day if capable  of pregnancy.   Sexuality: Discussed sexually transmitted diseases, partner selection, use of condoms, avoidance of unintended pregnancy  and contraceptive alternatives.   Diet: Encouraged to adjust caloric intake to maintain  or achieve ideal body weight, to reduce intake of dietary saturated fat and total fat, to limit sodium intake by avoiding high sodium foods and not adding table salt, and to maintain adequate dietary potassium and calcium preferably from fresh fruits, vegetables, and low-fat dairy products.    Stressed the importance of regular exercise  Injury prevention: Discussed safety belts, safety helmets, smoke detector, smoking near bedding or upholstery.   Dental health: Discussed importance of regular tooth brushing, flossing, and dental visits.    NEXT PREVENTATIVE PHYSICAL DUE IN 1 YEAR. Return in about 4 weeks (around 10/28/2022) for HEART CHECK AND ANXIETY -- added Sertraline and increase Metoprolol XL.

## 2022-09-30 NOTE — Assessment & Plan Note (Addendum)
Chronic, stable.  BP at goal in office and on home readings. Recommend she monitor BP at least a few mornings a week at home and document.  DASH diet at home.  Continue current medication regimen and adjust as needed.  Labs today: CBC, CMP, TSH, urine ALB. Urine ALB 24 September 2022.  Return in 6 months.

## 2022-10-01 LAB — HEMOGLOBIN A1C
Est. average glucose Bld gHb Est-mCnc: 114 mg/dL
Hgb A1c MFr Bld: 5.6 % (ref 4.8–5.6)

## 2022-10-01 LAB — COMPREHENSIVE METABOLIC PANEL
ALT: 21 IU/L (ref 0–32)
AST: 17 IU/L (ref 0–40)
Albumin/Globulin Ratio: 1.4 (ref 1.2–2.2)
Albumin: 4 g/dL (ref 3.8–4.8)
Alkaline Phosphatase: 59 IU/L (ref 44–121)
BUN/Creatinine Ratio: 23 (ref 12–28)
BUN: 14 mg/dL (ref 8–27)
Bilirubin Total: 0.3 mg/dL (ref 0.0–1.2)
CO2: 23 mmol/L (ref 20–29)
Calcium: 9.4 mg/dL (ref 8.7–10.3)
Chloride: 102 mmol/L (ref 96–106)
Creatinine, Ser: 0.62 mg/dL (ref 0.57–1.00)
Globulin, Total: 2.8 g/dL (ref 1.5–4.5)
Glucose: 83 mg/dL (ref 70–99)
Potassium: 4.1 mmol/L (ref 3.5–5.2)
Sodium: 140 mmol/L (ref 134–144)
Total Protein: 6.8 g/dL (ref 6.0–8.5)
eGFR: 94 mL/min/{1.73_m2} (ref >59)

## 2022-10-01 LAB — CBC WITH DIFFERENTIAL/PLATELET
Basophils Absolute: 0 10*3/uL (ref 0.0–0.2)
Basos: 1 %
EOS (ABSOLUTE): 0 10*3/uL (ref 0.0–0.4)
Eos: 0 %
Hematocrit: 40.8 % (ref 34.0–46.6)
Hemoglobin: 13 g/dL (ref 11.1–15.9)
Immature Grans (Abs): 0 10*3/uL (ref 0.0–0.1)
Immature Granulocytes: 0 %
Lymphocytes Absolute: 1.6 10*3/uL (ref 0.7–3.1)
Lymphs: 31 %
MCH: 29 pg (ref 26.6–33.0)
MCHC: 31.9 g/dL (ref 31.5–35.7)
MCV: 91 fL (ref 79–97)
Monocytes Absolute: 0.4 10*3/uL (ref 0.1–0.9)
Monocytes: 7 %
Neutrophils Absolute: 3 10*3/uL (ref 1.4–7.0)
Neutrophils: 61 %
Platelets: 273 10*3/uL (ref 150–450)
RBC: 4.49 x10E6/uL (ref 3.77–5.28)
RDW: 13.6 % (ref 11.7–15.4)
WBC: 4.9 10*3/uL (ref 3.4–10.8)

## 2022-10-01 LAB — LIPID PANEL W/O CHOL/HDL RATIO
Cholesterol, Total: 126 mg/dL (ref 100–199)
HDL: 68 mg/dL (ref >39)
LDL Chol Calc (NIH): 45 mg/dL (ref 0–99)
Triglycerides: 63 mg/dL (ref 0–149)
VLDL Cholesterol Cal: 13 mg/dL (ref 5–40)

## 2022-10-01 LAB — VITAMIN D 25 HYDROXY (VIT D DEFICIENCY, FRACTURES): Vit D, 25-Hydroxy: 53.3 ng/mL (ref 30.0–100.0)

## 2022-10-01 LAB — TSH: TSH: 1.05 u[IU]/mL (ref 0.450–4.500)

## 2022-10-01 LAB — T4, FREE: Free T4: 1.84 ng/dL — ABNORMAL HIGH (ref 0.82–1.77)

## 2022-10-01 NOTE — Progress Notes (Signed)
Good afternoon, please let Jessica Brooks know her labs have returned and overall these look great.  Her diabetes testing is out of prediabetic range at present and normal.  Kidney and liver function are normal.  Thyroid levels show normal TSH, Free T4 a little elevated but stable.  No medication changes needed!!  Great job!! Keep being amazing!!  Thank you for allowing me to participate in your care.  I appreciate you. Kindest regards, Maico Mulvehill

## 2022-10-06 ENCOUNTER — Ambulatory Visit: Payer: Medicare HMO | Admitting: Cardiology

## 2022-10-22 ENCOUNTER — Ambulatory Visit: Payer: Medicare HMO | Admitting: Cardiology

## 2022-10-30 ENCOUNTER — Ambulatory Visit: Payer: Medicare HMO | Admitting: Nurse Practitioner

## 2023-01-09 ENCOUNTER — Emergency Department: Payer: Medicare HMO

## 2023-01-09 ENCOUNTER — Inpatient Hospital Stay
Admission: EM | Admit: 2023-01-09 | Discharge: 2023-01-13 | Disposition: A | Discharge status: Home / Self Care | Payer: Medicare HMO | Attending: Internal Medicine | Admitting: Internal Medicine

## 2023-01-09 ENCOUNTER — Other Ambulatory Visit: Payer: Self-pay

## 2023-01-09 ENCOUNTER — Inpatient Hospital Stay: Payer: Medicare HMO

## 2023-01-09 ENCOUNTER — Encounter: Payer: Self-pay | Admitting: Radiology

## 2023-01-09 DIAGNOSIS — Z841 Family history of disorders of kidney and ureter: Secondary | ICD-10-CM

## 2023-01-09 DIAGNOSIS — Z79899 Other long term (current) drug therapy: Secondary | ICD-10-CM

## 2023-01-09 DIAGNOSIS — R2689 Other abnormalities of gait and mobility: Secondary | ICD-10-CM | POA: Diagnosis present

## 2023-01-09 DIAGNOSIS — M1990 Unspecified osteoarthritis, unspecified site: Secondary | ICD-10-CM | POA: Diagnosis present

## 2023-01-09 DIAGNOSIS — Z87891 Personal history of nicotine dependence: Secondary | ICD-10-CM

## 2023-01-09 DIAGNOSIS — J45909 Unspecified asthma, uncomplicated: Secondary | ICD-10-CM | POA: Diagnosis present

## 2023-01-09 DIAGNOSIS — Z6841 Body Mass Index (BMI) 40.0 and over, adult: Secondary | ICD-10-CM

## 2023-01-09 DIAGNOSIS — Z888 Allergy status to other drugs, medicaments and biological substances status: Secondary | ICD-10-CM | POA: Diagnosis not present

## 2023-01-09 DIAGNOSIS — Z8673 Personal history of transient ischemic attack (TIA), and cerebral infarction without residual deficits: Secondary | ICD-10-CM | POA: Diagnosis not present

## 2023-01-09 DIAGNOSIS — R42 Dizziness and giddiness: Secondary | ICD-10-CM | POA: Diagnosis present

## 2023-01-09 DIAGNOSIS — Z808 Family history of malignant neoplasm of other organs or systems: Secondary | ICD-10-CM

## 2023-01-09 DIAGNOSIS — Z7901 Long term (current) use of anticoagulants: Secondary | ICD-10-CM

## 2023-01-09 DIAGNOSIS — Z1152 Encounter for screening for COVID-19: Secondary | ICD-10-CM | POA: Diagnosis not present

## 2023-01-09 DIAGNOSIS — I2699 Other pulmonary embolism without acute cor pulmonale: Principal | ICD-10-CM | POA: Diagnosis present

## 2023-01-09 DIAGNOSIS — R112 Nausea with vomiting, unspecified: Secondary | ICD-10-CM | POA: Diagnosis present

## 2023-01-09 DIAGNOSIS — J4520 Mild intermittent asthma, uncomplicated: Secondary | ICD-10-CM | POA: Diagnosis not present

## 2023-01-09 DIAGNOSIS — I82452 Acute embolism and thrombosis of left peroneal vein: Secondary | ICD-10-CM | POA: Diagnosis present

## 2023-01-09 DIAGNOSIS — E890 Postprocedural hypothyroidism: Secondary | ICD-10-CM | POA: Diagnosis present

## 2023-01-09 DIAGNOSIS — Z79890 Hormone replacement therapy (postmenopausal): Secondary | ICD-10-CM

## 2023-01-09 DIAGNOSIS — F418 Other specified anxiety disorders: Secondary | ICD-10-CM | POA: Diagnosis present

## 2023-01-09 DIAGNOSIS — E876 Hypokalemia: Secondary | ICD-10-CM | POA: Diagnosis present

## 2023-01-09 DIAGNOSIS — E66813 Obesity, class 3: Secondary | ICD-10-CM

## 2023-01-09 DIAGNOSIS — I2609 Other pulmonary embolism with acute cor pulmonale: Secondary | ICD-10-CM | POA: Diagnosis not present

## 2023-01-09 DIAGNOSIS — Z8249 Family history of ischemic heart disease and other diseases of the circulatory system: Secondary | ICD-10-CM

## 2023-01-09 DIAGNOSIS — M25571 Pain in right ankle and joints of right foot: Secondary | ICD-10-CM | POA: Diagnosis present

## 2023-01-09 DIAGNOSIS — E782 Mixed hyperlipidemia: Secondary | ICD-10-CM | POA: Diagnosis present

## 2023-01-09 DIAGNOSIS — Z833 Family history of diabetes mellitus: Secondary | ICD-10-CM

## 2023-01-09 DIAGNOSIS — I10 Essential (primary) hypertension: Secondary | ICD-10-CM | POA: Diagnosis present

## 2023-01-09 DIAGNOSIS — R7301 Impaired fasting glucose: Secondary | ICD-10-CM | POA: Diagnosis present

## 2023-01-09 DIAGNOSIS — Z90710 Acquired absence of both cervix and uterus: Secondary | ICD-10-CM

## 2023-01-09 DIAGNOSIS — Z7951 Long term (current) use of inhaled steroids: Secondary | ICD-10-CM

## 2023-01-09 DIAGNOSIS — E669 Obesity, unspecified: Secondary | ICD-10-CM | POA: Diagnosis present

## 2023-01-09 DIAGNOSIS — Z6839 Body mass index (BMI) 39.0-39.9, adult: Secondary | ICD-10-CM | POA: Diagnosis not present

## 2023-01-09 LAB — COMPREHENSIVE METABOLIC PANEL
ALT: 18 U/L (ref 0–44)
AST: 16 U/L (ref 15–41)
Albumin: 4 g/dL (ref 3.5–5.0)
Alkaline Phosphatase: 56 U/L (ref 38–126)
Anion gap: 12 (ref 5–15)
BUN: 9 mg/dL (ref 8–23)
CO2: 23 mmol/L (ref 22–32)
Calcium: 9 mg/dL (ref 8.9–10.3)
Chloride: 102 mmol/L (ref 98–111)
Creatinine, Ser: 0.63 mg/dL (ref 0.44–1.00)
GFR, Estimated: >60 mL/min (ref >60)
Glucose, Bld: 116 mg/dL — ABNORMAL HIGH (ref 70–99)
Potassium: 3.3 mmol/L — ABNORMAL LOW (ref 3.5–5.1)
Sodium: 137 mmol/L (ref 135–145)
Total Bilirubin: 0.6 mg/dL (ref 0.3–1.2)
Total Protein: 7.7 g/dL (ref 6.5–8.1)

## 2023-01-09 LAB — CBC WITH DIFFERENTIAL/PLATELET
Abs Immature Granulocytes: 0.02 10*3/uL (ref 0.00–0.07)
Basophils Absolute: 0 10*3/uL (ref 0.0–0.1)
Basophils Relative: 1 %
Eosinophils Absolute: 0 10*3/uL (ref 0.0–0.5)
Eosinophils Relative: 0 %
HCT: 43.8 % (ref 36.0–46.0)
Hemoglobin: 14 g/dL (ref 12.0–15.0)
Immature Granulocytes: 0 %
Lymphocytes Relative: 24 %
Lymphs Abs: 1.3 10*3/uL (ref 0.7–4.0)
MCH: 27.9 pg (ref 26.0–34.0)
MCHC: 32 g/dL (ref 30.0–36.0)
MCV: 87.4 fL (ref 80.0–100.0)
Monocytes Absolute: 0.4 10*3/uL (ref 0.1–1.0)
Monocytes Relative: 7 %
Neutro Abs: 3.5 10*3/uL (ref 1.7–7.7)
Neutrophils Relative %: 68 %
Platelets: 232 10*3/uL (ref 150–400)
RBC: 5.01 MIL/uL (ref 3.87–5.11)
RDW: 14.6 % (ref 11.5–15.5)
WBC: 5.2 10*3/uL (ref 4.0–10.5)
nRBC: 0 % (ref 0.0–0.2)

## 2023-01-09 LAB — URINALYSIS, W/ REFLEX TO CULTURE (INFECTION SUSPECTED)
Bilirubin Urine: NEGATIVE
Glucose, UA: NEGATIVE mg/dL
Hgb urine dipstick: NEGATIVE
Ketones, ur: 20 mg/dL — AB
Leukocytes,Ua: NEGATIVE
Nitrite: NEGATIVE
Protein, ur: 30 mg/dL — AB
Specific Gravity, Urine: 1.01 (ref 1.005–1.030)
pH: 7 (ref 5.0–8.0)

## 2023-01-09 LAB — MAGNESIUM: Magnesium: 2.1 mg/dL (ref 1.7–2.4)

## 2023-01-09 LAB — BRAIN NATRIURETIC PEPTIDE: B Natriuretic Peptide: 19.3 pg/mL (ref 0.0–100.0)

## 2023-01-09 LAB — LACTIC ACID, PLASMA: Lactic Acid, Venous: 1.1 mmol/L (ref 0.5–1.9)

## 2023-01-09 LAB — TROPONIN I (HIGH SENSITIVITY)
Troponin I (High Sensitivity): 6 ng/L (ref <18)
Troponin I (High Sensitivity): 8 ng/L (ref <18)

## 2023-01-09 LAB — LIPASE, BLOOD: Lipase: 26 U/L (ref 11–51)

## 2023-01-09 LAB — SARS CORONAVIRUS 2 BY RT PCR: SARS Coronavirus 2 by RT PCR: NEGATIVE

## 2023-01-09 LAB — CBG MONITORING, ED: Glucose-Capillary: 92 mg/dL (ref 70–99)

## 2023-01-09 MED ORDER — HEPARIN BOLUS VIA INFUSION
5500.0000 [IU] | Freq: Once | INTRAVENOUS | Status: AC
  Administered 2023-01-09: 5500 [IU] via INTRAVENOUS
  Filled 2023-01-09: qty 5500

## 2023-01-09 MED ORDER — LACTATED RINGERS IV SOLN: INTRAVENOUS | Status: DC

## 2023-01-09 MED ORDER — LORAZEPAM 2 MG/ML IJ SOLN: 0.5000 mg | Freq: Four times a day (QID) | INTRAMUSCULAR | Status: AC | PRN

## 2023-01-09 MED ORDER — ALBUTEROL SULFATE (2.5 MG/3ML) 0.083% IN NEBU: 2.5000 mg | INHALATION_SOLUTION | Freq: Four times a day (QID) | RESPIRATORY_TRACT | Status: DC | PRN

## 2023-01-09 MED ORDER — ONDANSETRON HCL 4 MG/2ML IJ SOLN
4.0000 mg | Freq: Four times a day (QID) | INTRAMUSCULAR | Status: DC | PRN
  Administered 2023-01-09 – 2023-01-10 (×2): 4 mg via INTRAVENOUS
  Filled 2023-01-09 (×2): qty 2

## 2023-01-09 MED ORDER — ONDANSETRON HCL 4 MG PO TABS: 4.0000 mg | ORAL_TABLET | Freq: Four times a day (QID) | ORAL | Status: DC | PRN

## 2023-01-09 MED ORDER — ACETAMINOPHEN 650 MG RE SUPP: 650.0000 mg | Freq: Four times a day (QID) | RECTAL | Status: DC | PRN

## 2023-01-09 MED ORDER — SODIUM CHLORIDE 0.9 % IV SOLN: INTRAVENOUS | Status: DC

## 2023-01-09 MED ORDER — MONTELUKAST SODIUM 10 MG PO TABS
10.0000 mg | ORAL_TABLET | Freq: Every day | ORAL | Status: DC
  Administered 2023-01-10 – 2023-01-12 (×3): 10 mg via ORAL
  Filled 2023-01-09 (×3): qty 1

## 2023-01-09 MED ORDER — HEPARIN (PORCINE) 25000 UT/250ML-% IV SOLN
1050.0000 [IU]/h | INTRAVENOUS | Status: DC
  Administered 2023-01-09 – 2023-01-10 (×2): 1400 [IU]/h via INTRAVENOUS
  Administered 2023-01-11 – 2023-01-12 (×2): 1150 [IU]/h via INTRAVENOUS
  Administered 2023-01-13: 1050 [IU]/h via INTRAVENOUS
  Filled 2023-01-09 (×5): qty 250

## 2023-01-09 MED ORDER — SENNOSIDES-DOCUSATE SODIUM 8.6-50 MG PO TABS: 1.0000 | ORAL_TABLET | Freq: Every evening | ORAL | Status: DC | PRN

## 2023-01-09 MED ORDER — IOHEXOL 350 MG/ML SOLN
75.0000 mL | Freq: Once | INTRAVENOUS | Status: AC | PRN
  Administered 2023-01-09: 75 mL via INTRAVENOUS

## 2023-01-09 MED ORDER — SODIUM CHLORIDE 0.9 % IV BOLUS
1000.0000 mL | Freq: Once | INTRAVENOUS | Status: AC
  Administered 2023-01-09: 1000 mL via INTRAVENOUS

## 2023-01-09 MED ORDER — AMLODIPINE BESYLATE 5 MG PO TABS
5.0000 mg | ORAL_TABLET | Freq: Every day | ORAL | Status: DC
  Administered 2023-01-10 – 2023-01-13 (×4): 5 mg via ORAL
  Filled 2023-01-09 (×4): qty 1

## 2023-01-09 MED ORDER — METOPROLOL SUCCINATE ER 50 MG PO TB24
50.0000 mg | ORAL_TABLET | Freq: Every day | ORAL | Status: DC
  Administered 2023-01-10 – 2023-01-13 (×4): 50 mg via ORAL
  Filled 2023-01-09 (×4): qty 1

## 2023-01-09 MED ORDER — ROSUVASTATIN CALCIUM 10 MG PO TABS
10.0000 mg | ORAL_TABLET | Freq: Every day | ORAL | Status: DC
  Administered 2023-01-10 – 2023-01-12 (×3): 10 mg via ORAL
  Filled 2023-01-09 (×3): qty 1

## 2023-01-09 MED ORDER — ACETAMINOPHEN 325 MG PO TABS
650.0000 mg | ORAL_TABLET | Freq: Four times a day (QID) | ORAL | Status: DC | PRN
  Administered 2023-01-10: 650 mg via ORAL
  Filled 2023-01-09: qty 2

## 2023-01-09 MED ORDER — SODIUM CHLORIDE 0.9 % IV SOLN: 12.5000 mg | Freq: Four times a day (QID) | INTRAVENOUS | Status: AC | PRN

## 2023-01-09 MED ORDER — LEVOTHYROXINE SODIUM 100 MCG PO TABS
100.0000 ug | ORAL_TABLET | Freq: Every day | ORAL | Status: DC
  Administered 2023-01-11 – 2023-01-13 (×2): 100 ug via ORAL
  Filled 2023-01-09 (×2): qty 1

## 2023-01-09 MED ORDER — POTASSIUM CHLORIDE CRYS ER 20 MEQ PO TBCR: 20.0000 meq | EXTENDED_RELEASE_TABLET | Freq: Once | ORAL | Status: DC

## 2023-01-09 MED ORDER — SERTRALINE HCL 50 MG PO TABS
25.0000 mg | ORAL_TABLET | Freq: Every day | ORAL | Status: DC
  Administered 2023-01-10 – 2023-01-13 (×4): 25 mg via ORAL
  Filled 2023-01-09 (×4): qty 1

## 2023-01-09 MED ORDER — ONDANSETRON HCL 4 MG/2ML IJ SOLN
4.0000 mg | Freq: Once | INTRAMUSCULAR | Status: AC
  Administered 2023-01-09: 4 mg via INTRAVENOUS
  Filled 2023-01-09: qty 2

## 2023-01-09 MED ORDER — HYDRALAZINE HCL 20 MG/ML IJ SOLN: 5.0000 mg | Freq: Four times a day (QID) | INTRAMUSCULAR | Status: DC | PRN

## 2023-01-09 MED ORDER — LISINOPRIL 5 MG PO TABS
5.0000 mg | ORAL_TABLET | Freq: Every day | ORAL | Status: DC
  Administered 2023-01-10 – 2023-01-13 (×4): 5 mg via ORAL
  Filled 2023-01-09 (×5): qty 1

## 2023-01-09 MED ORDER — HEPARIN SODIUM (PORCINE) 5000 UNIT/ML IJ SOLN: 5000.0000 [IU] | Freq: Three times a day (TID) | INTRAMUSCULAR | Status: DC

## 2023-01-09 NOTE — ED Provider Notes (Addendum)
[facility] Provider Note    Event Date/Time   First MD Initiated Contact with Patient 01/09/23 1216     (approximate)   History   Vomiting (Patient presents by EMS with complaints of nausea and vomiting that began yesterday morning)   HPI  Jessica Brooks is a 72 y.o. 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  States that yesterday morning she started having significant dizziness and felt like the room was spinning.  Multiple episodes of nausea and vomiting.  She is uncertain if she had any change in walking because she has been just staying in bed.  EMS went to her house yesterday and checked her glucose and her vital signs and they were within normal limits.  Glucose was normal at that time.  States that her symptoms continued and worsened today which brought her back into the emergency department.  Does endorse some ongoing dizziness.  Denies any falls or head trauma.  Not on anticoagulation.  No history of diabetes.  Denies any chest pain or shortness of breath.  Not on anticoagulation.     Physical Exam   Triage Vital Signs: ED Triage Vitals  Encounter Vitals Group     BP      Systolic BP Percentile      Diastolic BP Percentile      Pulse      Resp      Temp      Temp src      SpO2      Weight      Height      Head Circumference      Peak Flow      Pain Score      Pain Loc      Pain Education      Exclude from Growth Chart     Most recent vital signs: Vitals:   01/09/23 1505 01/09/23 1506  BP:  (!) 156/83  Pulse:  93  Resp:  15  Temp:    SpO2: 94%     Physical Exam Constitutional:      Appearance: She is well-developed.  HENT:     Head: Atraumatic.  Eyes:     Conjunctiva/sclera: Conjunctivae normal.     Pupils: Pupils are equal, round, and reactive to light.   Cardiovascular:     Rate and Rhythm: Regular rhythm. Tachycardia present.  Pulmonary:     Effort: No respiratory distress.     Breath sounds: No wheezing or rales.  Abdominal:     General: There is no distension.     Tenderness: There is no abdominal tenderness.  Musculoskeletal:        General: Normal range of motion.     Cervical back: Normal range of motion.  Skin:    General: Skin is warm.  Neurological:     Mental Status: She is alert. Mental status is at baseline.     GCS: GCS eye subscore is 4. GCS verbal subscore is 5. GCS motor subscore is 6.     Cranial Nerves: Cranial nerves 2-12 are intact.     Sensory: Sensation is intact.     Motor: Motor function is intact.     Coordination: Coordination is intact.     Comments: Attempted to ambulate the patient however felt dizzy and weak upon standing, unable to ambulate.  Psychiatric:  Mood and Affect: Mood normal.     IMPRESSION / MDM / ASSESSMENT AND PLAN / ED COURSE  I reviewed the triage vital signs and the nursing notes.  Differential diagnosis including CVA, vertebrobasilar dissection/sufficiency, peripheral vertigo, dehydration, electrolyte abnormality, gastritis, ACS, COVID, urinary tract infection  Patient outside of the window for TNK.  Will workup the patient for CVA, no focal deficits on exam, too dizzy to attempt to ambulate at this time.  Will give IV fluids and antiemetics and reevaluate.   No tachycardic or bradycardic dysrhythmias while on cardiac telemetry.  RADIOLOGY I independently reviewed imaging, my interpretation of imaging: CTA head and neck. -No signs of intracranial hemorrhage or infarction on my evaluation.  Still waiting for formal read.  CT scan read as no acute intracranial pathology, unchanged remote infarct.  Possible bilateral pulmonary emboli and recommended CTA.  Chest x-ray no signs of pneumonia.  LABS (all labs ordered are listed, but only abnormal results are displayed) Labs  interpreted as -    Labs Reviewed  COMPREHENSIVE METABOLIC PANEL - Abnormal; Notable for the following components:      Result Value   Potassium 3.3 (*)    Glucose, Bld 116 (*)    All other components within normal limits  URINALYSIS, W/ REFLEX TO CULTURE (INFECTION SUSPECTED) - Abnormal; Notable for the following components:   Color, Urine YELLOW (*)    APPearance CLEAR (*)    Ketones, ur 20 (*)    Protein, ur 30 (*)    Bacteria, UA RARE (*)    All other components within normal limits  SARS CORONAVIRUS 2 BY RT PCR  CBC WITH DIFFERENTIAL/PLATELET  LIPASE, BLOOD  LACTIC ACID, PLASMA  MAGNESIUM  BRAIN NATRIURETIC PEPTIDE  CBG MONITORING, ED  TROPONIN I (HIGH SENSITIVITY)     MDM  Mild hypokalemia 3.3.  Creatinine at baseline.  No significant significant electrolyte abnormalities.  Lipase within normal limits.  Lactic acid normal at 1.1.  COVID testing negative.  UA with no signs of urinary tract infection.  CTA is currently pending.  On reevaluation patient continues to have significant dizziness upon moving or standing.  Continues to feel nauseous with vomiting.  Concern for dehydration possible CVA, given her ongoing gait instability with dizziness will admit for further evaluation to the hospitalist.  CTA read as concern for bilateral pulmonary emboli.  Consulted hospitalist for admission, they will complete further workup for PEs.  PROCEDURES:  Critical Care performed: yes  .Critical Care  Performed by: Corena Herter, MD Authorized by: Corena Herter, MD   Critical care provider statement:    Critical care time (minutes):  30   Critical care time was exclusive of:  Separately billable procedures and treating other patients   Critical care was necessary to treat or prevent imminent or life-threatening deterioration of the following conditions:  Circulatory failure   Critical care was time spent personally by me on the following activities:  Development of treatment  plan with patient or surrogate, discussions with consultants, evaluation of patient's response to treatment, examination of patient, ordering and review of laboratory studies, ordering and review of radiographic studies, ordering and performing treatments and interventions, pulse oximetry, re-evaluation of patient's condition and review of old charts   Patient's presentation is most consistent with acute presentation with potential threat to life or bodily function.   MEDICATIONS ORDERED IN ED: Medications  acetaminophen (TYLENOL) tablet 650 mg (has no administration in time range)    Or  acetaminophen (TYLENOL) suppository 650  mg (has no administration in time range)  ondansetron (ZOFRAN) tablet 4 mg (has no administration in time range)    Or  ondansetron (ZOFRAN) injection 4 mg (has no administration in time range)  heparin injection 5,000 Units (has no administration in time range)  senna-docusate (Senokot-S) tablet 1 tablet (has no administration in time range)  lactated ringers infusion (has no administration in time range)  ondansetron (ZOFRAN) injection 4 mg (4 mg Intravenous Given 01/09/23 1255)  sodium chloride 0.9 % bolus 1,000 mL (1,000 mLs Intravenous New Bag/Given 01/09/23 1255)  iohexol (OMNIPAQUE) 350 MG/ML injection 75 mL (75 mLs Intravenous Contrast Given 01/09/23 1329)    FINAL CLINICAL IMPRESSION(S) / ED DIAGNOSES   Final diagnoses:  Nausea and vomiting, unspecified vomiting type  Dizzy     Rx / DC Orders   ED Discharge Orders     None        Note:  This document was prepared using Dragon voice recognition software and may include unintentional dictation errors.   Corena Herter, MD 01/09/23 1511    Corena Herter, MD 01/09/23 1530

## 2023-01-09 NOTE — Progress Notes (Signed)
ANTICOAGULATION CONSULT NOTE - Initial Consult  Pharmacy Consult for heparin Indication: pulmonary embolus  Allergies  Allergen Reactions   Hydrochlorothiazide Palpitations    Jittery    Patient Measurements: Height: 5\' 5"  (165.1 cm) Weight: 112.9 kg (249 lb) IBW/kg (Calculated) : 57 Heparin Dosing Weight: 83.8 kg  Vital Signs: Temp: 98.6 F (37 C) (07/26 1657) Temp Source: Oral (07/26 1657) BP: 152/78 (07/26 1657) Pulse Rate: 97 (07/26 1657)  Labs: Recent Labs    01/09/23 1245 01/09/23 1600  HGB 14.0  --   HCT 43.8  --   PLT 232  --   CREATININE 0.63  --   TROPONINIHS  --  6   Estimated Creatinine Clearance: 78.5 mL/min (by C-G formula based on SCr of 0.63 mg/dL).  Medical History: Past Medical History:  Diagnosis Date   Arthritis    Asthma    uses inhaler   Cataract    Hyperlipidemia    Hypertension    Keloid    Assessment: 72 y/o female presenting with nausea, vomiting, and dizziness. CTA of chest shows bilateral pulmonary emboli. Pharmacy has been consulted to dose heparin. Patient is not on anticoagulation prior to admission.  Goal of Therapy:  Heparin level 0.3-0.7 units/ml Monitor platelets by anticoagulation protocol: Yes   Plan:  Give heparin bolus of 5500 units x1 Start heparin infusion at 1400 units/hour Check Atni-Xa level in 8 hours Monitor daily Anti-Xa levels while on heparin Monitor daily CBC and signs/symptoms of bleeding  Thank you for involving pharmacy in this patient's care.   Rockwell Alexandria, PharmD Clinical Pharmacist 01/09/2023 5:14 PM

## 2023-01-09 NOTE — Hospital Course (Addendum)
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  Vitals in the emergency department showed temperature of 98.8, respiration rate of 16, heart rate of 101, blood pressure 159/91, SpO2 of 95% on room air.  Serum sodium is 137, potassium 3.3, chloride 102, bicarb 23, BUN of 9, serum creatinine is 2.63, EGFR greater than 60, nonfasting blood glucose 116, WBC 5.2, hemoglobin 14, platelets of 232.  Lactic acid was 1.1.  UA was negative for leukocytes and nitrates.  CTA head and neck w and w/o CM: No acute intracranial pathology. 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 Possible bilateral pulmonary emboli. Recommend dedicated CTA chest for further evaluation   ED treatment: Ondansetron 4 mg IV one-time dose, sodium chloride 1 L bolus.  7/27: Vital stable, on room air, CTA chest with large volume PE bilaterally with a concern of right heart strain. Also noted to have a 9.6 cm cystic lesion below the spleen, likely left kidney but it was not visualized properly.  Will get benefit from renal ultrasound or CT as nonemergent basis for further evaluation. Lower extremity venous Doppler positive for left peroneal thrombus. Vascular surgery was consulted and patient was started on heparin infusion. Vascular surgery planning thrombectomy on Monday.  7/28: Hemodynamically stable, thrombectomy likely tomorrow. 7/29: Remained stable.  Awaiting thrombectomy later today. 7/30: Patient remained hemodynamically stable.  S/p bilateral mechanical thrombectomy with vascular surgery yesterday.  They were able to remove extensive clots.  Patient  tolerated the procedure well.  Some decrease in hemoglobin to 11.8 after the procedure due to some blood loss during surgery.  Patient was started on some supplement.  Her heparin is being switched with Eliquis.  Patient is being given 2 prescriptions 1 for starter pack and second for Eliquis 5 mg twice daily.  Patient need to finish starter pack first before starting 5 mg twice daily.  She will need at least 3 months of anticoagulation and need to have a close follow-up with PCP.  They can determine the further need.  Her home meloxicam was discontinued to decrease the risk of bleeding with anticoagulation.  Patient will continue rest of her home medications, along with addition of Eliquis and need to have a close follow-up with her providers for further recommendations.

## 2023-01-09 NOTE — Assessment & Plan Note (Signed)
Levothyroxine 100 mcg daily before breakfast

## 2023-01-09 NOTE — Assessment & Plan Note (Signed)
Rosuvastatin 10 mg daily

## 2023-01-09 NOTE — Assessment & Plan Note (Signed)
Check magnesium level on admission was within normal limits Potassium chloride 20 mill equivalent p.o. one-time dose ordered Recheck BMP in the a.m.

## 2023-01-09 NOTE — H&P (View-Only) (Signed)
[facility] Vascular Consult Note  MRN : [id number]  Jessica Brooks is a 72 y.o. (1951/05/07) female who presents with chief complaint of  Chief Complaint  Patient presents with   Vomiting    Patient presents by EMS with complaints of nausea and vomiting that began yesterday morning  .  History of Present Illness:  Pt c/o general sense of unwellness.  She denies any SOB, tachypneic, pleuritic pain or hemoptysis.  She does note some swelling pain in R ankle.  Current Facility-Administered Medications  Medication Dose Route Frequency Provider Last Rate Last Admin   0.9 %  sodium chloride infusion   Intravenous Continuous Cox, Amy N, DO       acetaminophen (TYLENOL) tablet 650 mg  650 mg Oral Q6H PRN Cox, Amy N, DO       Or   acetaminophen (TYLENOL) suppository 650 mg  650 mg Rectal Q6H PRN Cox, Amy N, DO       albuterol (PROVENTIL) (2.5 MG/3ML) 0.083% nebulizer solution 2.5 mg  2.5 mg Nebulization Q6H PRN Cox, Amy N, DO       [START ON 01/10/2023] amLODipine (NORVASC) tablet 5 mg  5 mg Oral Daily Cox, Amy N, DO       heparin ADULT infusion 100 units/mL (25000 units/283mL)  1,400 Units/hr Intravenous Continuous Cox, Amy N, DO 14 mL/hr at 01/09/23 1830 1,400 Units/hr at 01/09/23 1830   hydrALAZINE (APRESOLINE) injection 5 mg  5 mg Intravenous Q6H PRN Cox, Amy N, DO       [START ON 01/10/2023] levothyroxine (SYNTHROID) tablet 100 mcg  100 mcg Oral QAC breakfast Cox, Amy N, DO       [START ON 01/10/2023] lisinopril (ZESTRIL) tablet 5 mg  5 mg Oral Daily Cox, Amy N, DO       LORazepam (ATIVAN) injection 0.5 mg  0.5 mg Intravenous Q6H PRN Cox, Amy N, DO       [START ON 01/10/2023] metoprolol succinate (TOPROL-XL) 24 hr tablet 50 mg  50 mg Oral Daily Cox, Amy N, DO       montelukast (SINGULAIR) tablet 10 mg  10 mg Oral QHS Cox, Amy N, DO       ondansetron (ZOFRAN) tablet 4 mg  4 mg Oral Q6H PRN Cox, Amy N, DO       Or   ondansetron (ZOFRAN) injection 4 mg  4 mg  Intravenous Q6H PRN Cox, Amy N, DO   4 mg at 01/09/23 1708   potassium chloride SA (KLOR-CON M) CR tablet 20 mEq  20 mEq Oral Once Cox, Amy N, DO       promethazine (PHENERGAN) 12.5 mg in sodium chloride 0.9 % 50 mL IVPB  12.5 mg Intravenous Q6H PRN Cox, Amy N, DO       [START ON 01/10/2023] rosuvastatin (CRESTOR) tablet 10 mg  10 mg Oral QHS Cox, Amy N, DO       senna-docusate (Senokot-S) tablet 1 tablet  1 tablet Oral QHS PRN Cox, Amy N, DO       [START ON 01/10/2023] sertraline (ZOLOFT) tablet 25 mg  25 mg Oral Daily Cox, Amy N, DO        Past Medical History:  Diagnosis Date   Arthritis    Asthma    uses inhaler   Cataract    Hyperlipidemia    Hypertension    Keloid     Past Surgical History:  Procedure Laterality Date   ABDOMINAL HYSTERECTOMY  BREAST BIOPSY Right    needle bx years ago unsure of date   BREAST BIOPSY Right    another bx done by [facility]    CATARACT EXTRACTION W/PHACO Left 06/11/2017   Procedure: CATARACT EXTRACTION PHACO AND INTRAOCULAR LENS PLACEMENT (IOC)-LEFT;  Surgeon: Lockie Mola, MD;  Location: [facility] ORS;  Service: Ophthalmology;  Laterality: Left;  LOT # [id number] H [place] :34.4 AP 15.3% CDE 5.29   COLONOSCOPY WITH PROPOFOL N/A 10/17/2021   Procedure: COLONOSCOPY WITH PROPOFOL;  Surgeon: Wyline Mood, MD;  Location: [facility] ENDOSCOPY;  Service: Gastroenterology;  Laterality: N/A;   KELOID EXCISION N/A    KELOID REMOVAL TO NAVAL IN LATE 1980's   MULTIPLE TOOTH EXTRACTIONS      Social History Social History   Tobacco Use   Smoking status: Former    Current packs/day: 0.00    Types: Cigarettes    Quit date: 06/16/1978    Years since quitting: 44.5   Smokeless tobacco: Never  Vaping Use   Vaping status: Never Used  Substance Use Topics   Alcohol use: No   Drug use: No    Family History Family History  Problem Relation Age of Onset   Diabetes Mother    Hypertension Mother    Cancer Father        throat   Diabetes Sister    Kidney disease  Sister    Diabetes Brother    Kidney disease Brother    Breast cancer Neg Hx     Allergies  Allergen Reactions   Hydrochlorothiazide Palpitations    Jittery     REVIEW OF SYSTEMS (Negative unless checked)  Constitutional: [] Weight loss  [] Fever  [] Chills Cardiac: [] Chest pain   [] Chest pressure   [] Palpitations   [] Shortness of breath when laying flat   [] Shortness of breath at rest   [] Shortness of breath with exertion. Vascular:  [] Pain in legs with walking   [] Pain in legs at rest   [] Pain in legs when laying flat   [] Claudication   [] Pain in feet when walking  [] Pain in feet at rest  [] Pain in feet when laying flat   [] History of DVT   [] Phlebitis   [] Swelling in legs   [] Varicose veins   [] Non-healing ulcers Pulmonary:   [] Uses home oxygen   [] Productive cough   [] Hemoptysis   [] Wheeze  [] COPD   [] Asthma Neurologic:  [] Dizziness  [] Blackouts   [] Seizures   [] History of stroke   [] History of TIA  [] Aphasia   [] Temporary blindness   [] Dysphagia   [] Weakness or numbness in arms   [] Weakness or numbness in legs Musculoskeletal:  [] Arthritis   [] Joint swelling   [] Joint pain   [] Low back pain Hematologic:  [] Easy bruising  [] Easy bleeding   [] Hypercoagulable state   [] Anemic  [] Hepatitis Gastrointestinal:  [] Blood in stool   [] Vomiting blood  [] Gastroesophageal reflux/heartburn   [] Difficulty swallowing. Genitourinary:  [] Chronic kidney disease   [] Difficult urination  [] Frequent urination  [] Burning with urination   [] Blood in urine Skin:  [] Rashes   [] Ulcers   [] Wounds Psychological:  [] History of anxiety   []  History of major depression.  Physical Examination  Vitals:   01/09/23 1505 01/09/23 1506 01/09/23 1657 01/09/23 1930  BP:  (!) 156/83 (!) 152/78 (!) 150/86  Pulse:  93 97 (!) 108  Resp:  15 16 18   Temp:   98.6 F (37 C) 98.3 F (36.8 C)  TempSrc:   Oral   SpO2: 94%  98% 95%  Weight:  Height:       Body mass index is 41.44 kg/m. Gen:  WD/WN, NAD Pulmonary:   Good air movement, respirations not labored, equal bilaterally, no nasal cannula oxygen needed  Cardiac: RR, normal S1, S2, tachycardiac   CBC Lab Results  Component Value Date   WBC 5.2 01/09/2023   HGB 14.0 01/09/2023   HCT 43.8 01/09/2023   MCV 87.4 01/09/2023   PLT 232 01/09/2023    BMET    Component Value Date/Time   NA 137 01/09/2023 1245   NA 140 09/30/2022 0953   K 3.3 (L) 01/09/2023 1245   CL 102 01/09/2023 1245   CO2 23 01/09/2023 1245   GLUCOSE 116 (H) 01/09/2023 1245   BUN 9 01/09/2023 1245   BUN 14 09/30/2022 0953   CREATININE 0.63 01/09/2023 1245   CALCIUM 9.0 01/09/2023 1245   GFRNONAA >60 01/09/2023 1245   GFRAA 102 08/07/2020 1114   Estimated Creatinine Clearance: 78.5 mL/min (by C-G formula based on SCr of 0.63 mg/dL).  COAG No results found for: "INR", "PROTIME"  Radiology US Venous Img Lower Bilateral (DVT)  Result Date: 01/09/2023 CLINICAL DATA:  Bilateral pulmonary emboli EXAM: BILATERAL LOWER EXTREMITY VENOUS DOPPLER ULTRASOUND TECHNIQUE: Gray-scale sonography with graded compression, as well as color Doppler and duplex ultrasound were performed to evaluate the lower extremity deep venous systems from the level of the common femoral vein and including the common femoral, femoral, profunda femoral, popliteal and calf veins including the posterior tibial, peroneal and gastrocnemius veins when visible. The superficial great saphenous vein was also interrogated. Spectral Doppler was utilized to evaluate flow at rest and with distal augmentation maneuvers in the common femoral, femoral and popliteal veins. COMPARISON:  None Available. FINDINGS: RIGHT LOWER EXTREMITY Common Femoral Vein: No evidence of thrombus. Normal compressibility, respiratory phasicity and response to augmentation. Saphenofemoral Junction: No evidence of thrombus. Normal compressibility and flow on color Doppler imaging. Profunda Femoral Vein: No evidence of thrombus. Normal compressibility  and flow on color Doppler imaging. Femoral Vein: No evidence of thrombus. Normal compressibility, respiratory phasicity and response to augmentation. Popliteal Vein: No evidence of thrombus. Normal compressibility, respiratory phasicity and response to augmentation. Calf Veins: No evidence of thrombus. Normal compressibility and flow on color Doppler imaging. Superficial Great Saphenous Vein: No evidence of thrombus. Normal compressibility. Venous Reflux:  None. Other Findings:  None. LEFT LOWER EXTREMITY Common Femoral Vein: No evidence of thrombus. Normal compressibility, respiratory phasicity and response to augmentation. Saphenofemoral Junction: No evidence of thrombus. Normal compressibility and flow on color Doppler imaging. Profunda Femoral Vein: No evidence of thrombus. Normal compressibility and flow on color Doppler imaging. Femoral Vein: No evidence of thrombus. Normal compressibility, respiratory phasicity and response to augmentation. Popliteal Vein: No evidence of thrombus. Normal compressibility, respiratory phasicity and response to augmentation. Calf Veins: Thrombus is noted in the peroneal vein with decreased compressibility. Superficial Great Saphenous Vein: No evidence of thrombus. Normal compressibility. Venous Reflux:  None. Other Findings:  None. IMPRESSION: Left peroneal thrombus. Remainder of the lower extremity veins appear within normal limits. Electronically Signed   By: Alcide Clever M.D.   On: 01/09/2023 18:09   CT Angio Chest Pulmonary Embolism (PE) W or WO Contrast  Result Date: 01/09/2023 CLINICAL DATA:  Pulmonary embolism suspected, high probability. Abnormal CTA of the head and neck. EXAM: CT ANGIOGRAPHY CHEST WITH CONTRAST TECHNIQUE: Multidetector CT imaging of the chest was performed using the standard protocol during bolus administration of intravenous contrast. Multiplanar CT image reconstructions and MIPs were  obtained to evaluate the vascular anatomy. RADIATION DOSE  REDUCTION: This exam was performed according to the departmental dose-optimization program which includes automated exposure control, adjustment of the mA and/or kV according to patient size and/or use of iterative reconstruction technique. CONTRAST:  75mL OMNIPAQUE IOHEXOL 350 MG/ML SOLN COMPARISON:  None Available. FINDINGS: Cardiovascular: The heart size is normal. Right ventricular measurement is 4.6 cm. Maximal left ventricular measurement is 4.4 cm. Minimal calcifications are present in the distal aortic arch. Great vessel origins are within limits. Pulmonary artery opacification is excellent. Extensive filling defects are present in the right upper lobe are pulmonary arteries and branch vessels. Occlusive embolus is present in the anterior right middle lobar artery. Extensive emboli are present in the inter lobar artery and extending into segmental branches in the right lower lobe. Embolus is present in the superior segment of the right lower lobe. Scattered segmental emboli are present in the left. Mediastinum/Nodes: No enlarged mediastinal, hilar, or axillary lymph nodes. Thyroid gland, trachea, and esophagus demonstrate no significant findings. Lungs/Pleura: Mild dependent atelectasis is present. No focal airspace disease is present. The airways are patent. Upper Abdomen: Multiple hepatic cysts are present. The largest is in the left lobe measuring 3.7 cm. A cystic lesion below the spleen measures up to 9.6 cm, likely related to the left kidney. No other focal lesions are present. No significant adenopathy is present. Musculoskeletal: The vertebral body heights are normal. Thoracic kyphosis is within normal limits. Fused anterior osteophytes are present throughout the thoracic spine. Ribs are unremarkable. Review of the MIP images confirms the above findings. IMPRESSION: 1. Large volume of pulmonary emboli in the lungs bilaterally, with dilatation of the right atrium and right ventricle (RV to LV ratio of  1.05) indicative of elevated right-sided heart pressures and right heart strain. These findings have been shown to be associated with a increased morbidity and mortality in the setting pulmonary embolism. 2. Multiple hepatic cysts. 3. 9.6 cm cystic lesion below the spleen, likely related to the left kidney. Lesion is incompletely imaged. Renal ultrasound or CT of the abdomen could be used for further evaluation. Critical Value/emergent results were called by telephone at the time of interpretation on 01/09/2023 at 4:57 pm to provider Dr. Derrill Kay, Who verbally acknowledged these results. Electronically Signed   By: Marin Roberts M.D.   On: 01/09/2023 17:00   CT Angio Head Neck W WO CM  Result Date: 01/09/2023 CLINICAL DATA:  Nausea and vomiting. EXAM: CT ANGIOGRAPHY HEAD AND NECK WITH AND WITHOUT CONTRAST TECHNIQUE: Multidetector CT imaging of the head and neck was performed using the standard protocol during bolus administration of intravenous contrast. Multiplanar CT image reconstructions and MIPs were obtained to evaluate the vascular anatomy. Carotid stenosis measurements (when applicable) are obtained utilizing NASCET criteria, using the distal internal carotid diameter as the denominator. RADIATION DOSE REDUCTION: This exam was performed according to the departmental dose-optimization program which includes automated exposure control, adjustment of the mA and/or kV according to patient size and/or use of iterative reconstruction technique. CONTRAST:  75mL OMNIPAQUE IOHEXOL 350 MG/ML SOLN COMPARISON:  Brain MRI 11/21/2020 FINDINGS: CT HEAD FINDINGS Brain: There is no acute intracranial hemorrhage, extra-axial fluid collection, or acute infarct. Parenchymal volume is normal. The ventricles are normal in size. Gray-white differentiation is preserved. 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 The pituitary and suprasellar region are normal. There is no mass lesion. There is no  mass effect or midline shift. Vascular: See below. Skull: Normal.  Negative for fracture or focal lesion. Sinuses/Orbits: The paranasal sinuses are clear. A left lens implant is noted. The globes and orbits are otherwise unremarkable. Other: The mastoid air cells and middle ear cavities are clear. A soft tissue nodule in the left cheek is unchanged since 2022, likely a sebaceous cyst or similar. Review of the MIP images confirms the above findings CTA NECK FINDINGS Aortic arch: There is a minimal calcified plaque in the aortic arch. The origins of the major branch vessels are patent. The subclavian arteries are patent to the level imaged. Right carotid system: The right common, internal, and external carotid arteries are patent, without significant stenosis or occlusion. There is mild fusiform dilation of the high cervical right internal carotid artery measuring up 2 5 mm in diameter. There is no saccular aneurysm/pseudoaneurysm or dissection flap. Left carotid system: The left common, internal, and external carotid arteries are patent, without significant stenosis or occlusion. There is fusiform dilation of the high cervical left internal carotid artery measuring up to 7 mm proximal to the skull base. There is no saccular aneurysm/pseudoaneurysm or dissection flap. Vertebral arteries: The vertebral arteries are patent, without hemodynamically significant stenosis or occlusion. There is no evidence of dissection or aneurysm. Skeleton: There is no acute osseous abnormality or suspicious osseous lesion. There is no visible canal hematoma. Other neck: The soft tissues of the neck are unremarkable. Upper chest: The imaged lung apices are clear. There are possible pulmonary emboli at the lobar level on the right and the segmental level on the left, predominantly nonocclusive. Review of the MIP images confirms the above findings CTA HEAD FINDINGS Anterior circulation: There is calcified plaque in the intracranial ICAs  resulting in up to mild stenosis on the left and no significant stenosis on the right. The bilateral MCAs and ACAS are patent, without proximal stenosis or occlusion. The anterior communicating artery is not definitely seen. There is no aneurysm or AVM. Posterior circulation: The bilateral V4 segments are patent. The basilar artery is patent. The major cerebellar arteries appear patent. The bilateral PCAs are patent, without proximal stenosis or occlusion. Posterior communicating arteries are not identified. There is no aneurysm or AVM. Venous sinuses: Patent. Anatomic variants: None. Review of the MIP images confirms the above findings IMPRESSION: 1. No acute intracranial pathology. 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 2. Patent vasculature of the head and neck with no significant stenosis or occlusion. 3. 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 4. Possible bilateral pulmonary emboli. Recommend dedicated CTA chest for further evaluation. Electronically Signed   By: Lesia Hausen M.D.   On: 01/09/2023 14:25   DG Chest 2 View  Result Date: 01/09/2023 CLINICAL DATA:  cough EXAM: CHEST - 2 VIEW COMPARISON:  11/16/2020. FINDINGS: Bilateral lung fields are clear. Redemonstration of elevated right hemidiaphragm. Bilateral costophrenic angles are clear. No acute osseous abnormalities. The soft tissues are within normal limits. IMPRESSION: No active cardiopulmonary disease. Electronically Signed   By: Jules Schick M.D.   On: 01/09/2023 14:23      Assessment/Plan 1. BLE PE  - Despite CTA findings, pt is maintaining oxygen saturation >90% without any oxygen supplementation so no immediate interventions needed - If pt's pulmonary status deteriorate will consider more urgent intervention - Anticoagulate on Heparin drip - Case d/w Dr. Wyn Quaker: planning on PE thrombectomy on  Monday   Leonides Sake, MD, [place], [facility] [phone])  covering for Festus Barren, MD  01/09/2023 9:41 PM    This note was created with Dragon medical transcription system.  Any error is purely unintentional

## 2023-01-09 NOTE — Plan of Care (Signed)
  Problem: Education: Goal: Knowledge of General Education information will improve Description: Including pain rating scale, medication(s)/side effects and non-pharmacologic comfort measures Outcome: Progressing   Problem: Activity: Goal: Risk for activity intolerance will decrease Outcome: Progressing   Problem: Clinical Measurements: Goal: Ability to maintain clinical measurements within normal limits will improve Outcome: Progressing Goal: Will remain free from infection Outcome: Progressing Goal: Diagnostic test results will improve Outcome: Progressing Goal: Respiratory complications will improve Outcome: Progressing Goal: Cardiovascular complication will be avoided Outcome: Progressing   Problem: Activity: Goal: Risk for activity intolerance will decrease Outcome: Progressing   Problem: Safety: Goal: Ability to remain free from injury will improve Outcome: Progressing

## 2023-01-09 NOTE — Assessment & Plan Note (Addendum)
Home antihypertensive medications include amlodipine 5 mg daily, lisinopril 5 mg daily Hydralazine 5 mg IV every 6 hours as needed for SBP greater 175, 5 days with

## 2023-01-09 NOTE — Assessment & Plan Note (Signed)
Ativan 0.5 mg IV every 6 hours as needed for anxiety, 1 day ordered

## 2023-01-09 NOTE — Assessment & Plan Note (Signed)
Does not appear to be in acute exacerbation Albuterol nebulizer every 6 hours as needed for shortness of breath and wheezing, 5 days ordered

## 2023-01-09 NOTE — Assessment & Plan Note (Signed)
-   This complicates overall care and prognosis.  

## 2023-01-09 NOTE — Assessment & Plan Note (Signed)
Status post CT a of the chest for PE, per radiology with CT evidence of right heart strain.  Echocardiogram 49-month with normal biventricular function.  Lower extremity venous Doppler positive for left peroneus thrombus. Patient is little sedentary, no recent travel or history of malignancy.  Colonoscopy done last year was normal except some internal hemorrhoids. Vascular surgery was consulted and patient will be going for mechanical thrombectomy on Monday.  Clinically stable. -Continue with heparin infusion -Continue to monitor
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
## 2023-01-09 NOTE — ED Triage Notes (Signed)
Patient presents by EMS with complaints of nausea and vomiting that began yesterday morning

## 2023-01-09 NOTE — Consult Note (Signed)
[facility] Vascular Consult Note  MRN : [id number]  Jessica Brooks is a 72 y.o. (1951/05/07) female who presents with chief complaint of  Chief Complaint  Patient presents with   Vomiting    Patient presents by EMS with complaints of nausea and vomiting that began yesterday morning  .  History of Present Illness:  Pt c/o general sense of unwellness.  She denies any SOB, tachypneic, pleuritic pain or hemoptysis.  She does note some swelling pain in R ankle.  Current Facility-Administered Medications  Medication Dose Route Frequency Provider Last Rate Last Admin   0.9 %  sodium chloride infusion   Intravenous Continuous Cox, Amy N, DO       acetaminophen (TYLENOL) tablet 650 mg  650 mg Oral Q6H PRN Cox, Amy N, DO       Or   acetaminophen (TYLENOL) suppository 650 mg  650 mg Rectal Q6H PRN Cox, Amy N, DO       albuterol (PROVENTIL) (2.5 MG/3ML) 0.083% nebulizer solution 2.5 mg  2.5 mg Nebulization Q6H PRN Cox, Amy N, DO       [START ON 01/10/2023] amLODipine (NORVASC) tablet 5 mg  5 mg Oral Daily Cox, Amy N, DO       heparin ADULT infusion 100 units/mL (25000 units/283mL)  1,400 Units/hr Intravenous Continuous Cox, Amy N, DO 14 mL/hr at 01/09/23 1830 1,400 Units/hr at 01/09/23 1830   hydrALAZINE (APRESOLINE) injection 5 mg  5 mg Intravenous Q6H PRN Cox, Amy N, DO       [START ON 01/10/2023] levothyroxine (SYNTHROID) tablet 100 mcg  100 mcg Oral QAC breakfast Cox, Amy N, DO       [START ON 01/10/2023] lisinopril (ZESTRIL) tablet 5 mg  5 mg Oral Daily Cox, Amy N, DO       LORazepam (ATIVAN) injection 0.5 mg  0.5 mg Intravenous Q6H PRN Cox, Amy N, DO       [START ON 01/10/2023] metoprolol succinate (TOPROL-XL) 24 hr tablet 50 mg  50 mg Oral Daily Cox, Amy N, DO       montelukast (SINGULAIR) tablet 10 mg  10 mg Oral QHS Cox, Amy N, DO       ondansetron (ZOFRAN) tablet 4 mg  4 mg Oral Q6H PRN Cox, Amy N, DO       Or   ondansetron (ZOFRAN) injection 4 mg  4 mg  Intravenous Q6H PRN Cox, Amy N, DO   4 mg at 01/09/23 1708   potassium chloride SA (KLOR-CON M) CR tablet 20 mEq  20 mEq Oral Once Cox, Amy N, DO       promethazine (PHENERGAN) 12.5 mg in sodium chloride 0.9 % 50 mL IVPB  12.5 mg Intravenous Q6H PRN Cox, Amy N, DO       [START ON 01/10/2023] rosuvastatin (CRESTOR) tablet 10 mg  10 mg Oral QHS Cox, Amy N, DO       senna-docusate (Senokot-S) tablet 1 tablet  1 tablet Oral QHS PRN Cox, Amy N, DO       [START ON 01/10/2023] sertraline (ZOLOFT) tablet 25 mg  25 mg Oral Daily Cox, Amy N, DO        Past Medical History:  Diagnosis Date   Arthritis    Asthma    uses inhaler   Cataract    Hyperlipidemia    Hypertension    Keloid     Past Surgical History:  Procedure Laterality Date   ABDOMINAL HYSTERECTOMY  BREAST BIOPSY Right    needle bx years ago unsure of date   BREAST BIOPSY Right    another bx done by [facility]    CATARACT EXTRACTION W/PHACO Left 06/11/2017   Procedure: CATARACT EXTRACTION PHACO AND INTRAOCULAR LENS PLACEMENT (IOC)-LEFT;  Surgeon: Lockie Mola, MD;  Location: [facility] ORS;  Service: Ophthalmology;  Laterality: Left;  LOT # [id number] H [place] :34.4 AP 15.3% CDE 5.29   COLONOSCOPY WITH PROPOFOL N/A 10/17/2021   Procedure: COLONOSCOPY WITH PROPOFOL;  Surgeon: Wyline Mood, MD;  Location: [facility] ENDOSCOPY;  Service: Gastroenterology;  Laterality: N/A;   KELOID EXCISION N/A    KELOID REMOVAL TO NAVAL IN LATE 1980's   MULTIPLE TOOTH EXTRACTIONS      Social History Social History   Tobacco Use   Smoking status: Former    Current packs/day: 0.00    Types: Cigarettes    Quit date: 06/16/1978    Years since quitting: 44.5   Smokeless tobacco: Never  Vaping Use   Vaping status: Never Used  Substance Use Topics   Alcohol use: No   Drug use: No    Family History Family History  Problem Relation Age of Onset   Diabetes Mother    Hypertension Mother    Cancer Father        throat   Diabetes Sister    Kidney disease  Sister    Diabetes Brother    Kidney disease Brother    Breast cancer Neg Hx     Allergies  Allergen Reactions   Hydrochlorothiazide Palpitations    Jittery     REVIEW OF SYSTEMS (Negative unless checked)  Constitutional: [] Weight loss  [] Fever  [] Chills Cardiac: [] Chest pain   [] Chest pressure   [] Palpitations   [] Shortness of breath when laying flat   [] Shortness of breath at rest   [] Shortness of breath with exertion. Vascular:  [] Pain in legs with walking   [] Pain in legs at rest   [] Pain in legs when laying flat   [] Claudication   [] Pain in feet when walking  [] Pain in feet at rest  [] Pain in feet when laying flat   [] History of DVT   [] Phlebitis   [] Swelling in legs   [] Varicose veins   [] Non-healing ulcers Pulmonary:   [] Uses home oxygen   [] Productive cough   [] Hemoptysis   [] Wheeze  [] COPD   [] Asthma Neurologic:  [] Dizziness  [] Blackouts   [] Seizures   [] History of stroke   [] History of TIA  [] Aphasia   [] Temporary blindness   [] Dysphagia   [] Weakness or numbness in arms   [] Weakness or numbness in legs Musculoskeletal:  [] Arthritis   [] Joint swelling   [] Joint pain   [] Low back pain Hematologic:  [] Easy bruising  [] Easy bleeding   [] Hypercoagulable state   [] Anemic  [] Hepatitis Gastrointestinal:  [] Blood in stool   [] Vomiting blood  [] Gastroesophageal reflux/heartburn   [] Difficulty swallowing. Genitourinary:  [] Chronic kidney disease   [] Difficult urination  [] Frequent urination  [] Burning with urination   [] Blood in urine Skin:  [] Rashes   [] Ulcers   [] Wounds Psychological:  [] History of anxiety   []  History of major depression.  Physical Examination  Vitals:   01/09/23 1505 01/09/23 1506 01/09/23 1657 01/09/23 1930  BP:  (!) 156/83 (!) 152/78 (!) 150/86  Pulse:  93 97 (!) 108  Resp:  15 16 18   Temp:   98.6 F (37 C) 98.3 F (36.8 C)  TempSrc:   Oral   SpO2: 94%  98% 95%  Weight:  Height:       Body mass index is 41.44 kg/m. Gen:  WD/WN, NAD Pulmonary:   Good air movement, respirations not labored, equal bilaterally, no nasal cannula oxygen needed  Cardiac: RR, normal S1, S2, tachycardiac   CBC Lab Results  Component Value Date   WBC 5.2 01/09/2023   HGB 14.0 01/09/2023   HCT 43.8 01/09/2023   MCV 87.4 01/09/2023   PLT 232 01/09/2023    BMET    Component Value Date/Time   NA 137 01/09/2023 1245   NA 140 09/30/2022 0953   K 3.3 (L) 01/09/2023 1245   CL 102 01/09/2023 1245   CO2 23 01/09/2023 1245   GLUCOSE 116 (H) 01/09/2023 1245   BUN 9 01/09/2023 1245   BUN 14 09/30/2022 0953   CREATININE 0.63 01/09/2023 1245   CALCIUM 9.0 01/09/2023 1245   GFRNONAA >60 01/09/2023 1245   GFRAA 102 08/07/2020 1114   Estimated Creatinine Clearance: 78.5 mL/min (by C-G formula based on SCr of 0.63 mg/dL).  COAG No results found for: "INR", "PROTIME"  Radiology US Venous Img Lower Bilateral (DVT)  Result Date: 01/09/2023 CLINICAL DATA:  Bilateral pulmonary emboli EXAM: BILATERAL LOWER EXTREMITY VENOUS DOPPLER ULTRASOUND TECHNIQUE: Gray-scale sonography with graded compression, as well as color Doppler and duplex ultrasound were performed to evaluate the lower extremity deep venous systems from the level of the common femoral vein and including the common femoral, femoral, profunda femoral, popliteal and calf veins including the posterior tibial, peroneal and gastrocnemius veins when visible. The superficial great saphenous vein was also interrogated. Spectral Doppler was utilized to evaluate flow at rest and with distal augmentation maneuvers in the common femoral, femoral and popliteal veins. COMPARISON:  None Available. FINDINGS: RIGHT LOWER EXTREMITY Common Femoral Vein: No evidence of thrombus. Normal compressibility, respiratory phasicity and response to augmentation. Saphenofemoral Junction: No evidence of thrombus. Normal compressibility and flow on color Doppler imaging. Profunda Femoral Vein: No evidence of thrombus. Normal compressibility  and flow on color Doppler imaging. Femoral Vein: No evidence of thrombus. Normal compressibility, respiratory phasicity and response to augmentation. Popliteal Vein: No evidence of thrombus. Normal compressibility, respiratory phasicity and response to augmentation. Calf Veins: No evidence of thrombus. Normal compressibility and flow on color Doppler imaging. Superficial Great Saphenous Vein: No evidence of thrombus. Normal compressibility. Venous Reflux:  None. Other Findings:  None. LEFT LOWER EXTREMITY Common Femoral Vein: No evidence of thrombus. Normal compressibility, respiratory phasicity and response to augmentation. Saphenofemoral Junction: No evidence of thrombus. Normal compressibility and flow on color Doppler imaging. Profunda Femoral Vein: No evidence of thrombus. Normal compressibility and flow on color Doppler imaging. Femoral Vein: No evidence of thrombus. Normal compressibility, respiratory phasicity and response to augmentation. Popliteal Vein: No evidence of thrombus. Normal compressibility, respiratory phasicity and response to augmentation. Calf Veins: Thrombus is noted in the peroneal vein with decreased compressibility. Superficial Great Saphenous Vein: No evidence of thrombus. Normal compressibility. Venous Reflux:  None. Other Findings:  None. IMPRESSION: Left peroneal thrombus. Remainder of the lower extremity veins appear within normal limits. Electronically Signed   By: Alcide Clever M.D.   On: 01/09/2023 18:09   CT Angio Chest Pulmonary Embolism (PE) W or WO Contrast  Result Date: 01/09/2023 CLINICAL DATA:  Pulmonary embolism suspected, high probability. Abnormal CTA of the head and neck. EXAM: CT ANGIOGRAPHY CHEST WITH CONTRAST TECHNIQUE: Multidetector CT imaging of the chest was performed using the standard protocol during bolus administration of intravenous contrast. Multiplanar CT image reconstructions and MIPs were  obtained to evaluate the vascular anatomy. RADIATION DOSE  REDUCTION: This exam was performed according to the departmental dose-optimization program which includes automated exposure control, adjustment of the mA and/or kV according to patient size and/or use of iterative reconstruction technique. CONTRAST:  75mL OMNIPAQUE IOHEXOL 350 MG/ML SOLN COMPARISON:  None Available. FINDINGS: Cardiovascular: The heart size is normal. Right ventricular measurement is 4.6 cm. Maximal left ventricular measurement is 4.4 cm. Minimal calcifications are present in the distal aortic arch. Great vessel origins are within limits. Pulmonary artery opacification is excellent. Extensive filling defects are present in the right upper lobe are pulmonary arteries and branch vessels. Occlusive embolus is present in the anterior right middle lobar artery. Extensive emboli are present in the inter lobar artery and extending into segmental branches in the right lower lobe. Embolus is present in the superior segment of the right lower lobe. Scattered segmental emboli are present in the left. Mediastinum/Nodes: No enlarged mediastinal, hilar, or axillary lymph nodes. Thyroid gland, trachea, and esophagus demonstrate no significant findings. Lungs/Pleura: Mild dependent atelectasis is present. No focal airspace disease is present. The airways are patent. Upper Abdomen: Multiple hepatic cysts are present. The largest is in the left lobe measuring 3.7 cm. A cystic lesion below the spleen measures up to 9.6 cm, likely related to the left kidney. No other focal lesions are present. No significant adenopathy is present. Musculoskeletal: The vertebral body heights are normal. Thoracic kyphosis is within normal limits. Fused anterior osteophytes are present throughout the thoracic spine. Ribs are unremarkable. Review of the MIP images confirms the above findings. IMPRESSION: 1. Large volume of pulmonary emboli in the lungs bilaterally, with dilatation of the right atrium and right ventricle (RV to LV ratio of  1.05) indicative of elevated right-sided heart pressures and right heart strain. These findings have been shown to be associated with a increased morbidity and mortality in the setting pulmonary embolism. 2. Multiple hepatic cysts. 3. 9.6 cm cystic lesion below the spleen, likely related to the left kidney. Lesion is incompletely imaged. Renal ultrasound or CT of the abdomen could be used for further evaluation. Critical Value/emergent results were called by telephone at the time of interpretation on 01/09/2023 at 4:57 pm to provider Dr. Derrill Kay, Who verbally acknowledged these results. Electronically Signed   By: Marin Roberts M.D.   On: 01/09/2023 17:00   CT Angio Head Neck W WO CM  Result Date: 01/09/2023 CLINICAL DATA:  Nausea and vomiting. EXAM: CT ANGIOGRAPHY HEAD AND NECK WITH AND WITHOUT CONTRAST TECHNIQUE: Multidetector CT imaging of the head and neck was performed using the standard protocol during bolus administration of intravenous contrast. Multiplanar CT image reconstructions and MIPs were obtained to evaluate the vascular anatomy. Carotid stenosis measurements (when applicable) are obtained utilizing NASCET criteria, using the distal internal carotid diameter as the denominator. RADIATION DOSE REDUCTION: This exam was performed according to the departmental dose-optimization program which includes automated exposure control, adjustment of the mA and/or kV according to patient size and/or use of iterative reconstruction technique. CONTRAST:  75mL OMNIPAQUE IOHEXOL 350 MG/ML SOLN COMPARISON:  Brain MRI 11/21/2020 FINDINGS: CT HEAD FINDINGS Brain: There is no acute intracranial hemorrhage, extra-axial fluid collection, or acute infarct. Parenchymal volume is normal. The ventricles are normal in size. Gray-white differentiation is preserved. 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 The pituitary and suprasellar region are normal. There is no mass lesion. There is no  mass effect or midline shift. Vascular: See below. Skull: Normal.  Negative for fracture or focal lesion. Sinuses/Orbits: The paranasal sinuses are clear. A left lens implant is noted. The globes and orbits are otherwise unremarkable. Other: The mastoid air cells and middle ear cavities are clear. A soft tissue nodule in the left cheek is unchanged since 2022, likely a sebaceous cyst or similar. Review of the MIP images confirms the above findings CTA NECK FINDINGS Aortic arch: There is a minimal calcified plaque in the aortic arch. The origins of the major branch vessels are patent. The subclavian arteries are patent to the level imaged. Right carotid system: The right common, internal, and external carotid arteries are patent, without significant stenosis or occlusion. There is mild fusiform dilation of the high cervical right internal carotid artery measuring up 2 5 mm in diameter. There is no saccular aneurysm/pseudoaneurysm or dissection flap. Left carotid system: The left common, internal, and external carotid arteries are patent, without significant stenosis or occlusion. There is fusiform dilation of the high cervical left internal carotid artery measuring up to 7 mm proximal to the skull base. There is no saccular aneurysm/pseudoaneurysm or dissection flap. Vertebral arteries: The vertebral arteries are patent, without hemodynamically significant stenosis or occlusion. There is no evidence of dissection or aneurysm. Skeleton: There is no acute osseous abnormality or suspicious osseous lesion. There is no visible canal hematoma. Other neck: The soft tissues of the neck are unremarkable. Upper chest: The imaged lung apices are clear. There are possible pulmonary emboli at the lobar level on the right and the segmental level on the left, predominantly nonocclusive. Review of the MIP images confirms the above findings CTA HEAD FINDINGS Anterior circulation: There is calcified plaque in the intracranial ICAs  resulting in up to mild stenosis on the left and no significant stenosis on the right. The bilateral MCAs and ACAS are patent, without proximal stenosis or occlusion. The anterior communicating artery is not definitely seen. There is no aneurysm or AVM. Posterior circulation: The bilateral V4 segments are patent. The basilar artery is patent. The major cerebellar arteries appear patent. The bilateral PCAs are patent, without proximal stenosis or occlusion. Posterior communicating arteries are not identified. There is no aneurysm or AVM. Venous sinuses: Patent. Anatomic variants: None. Review of the MIP images confirms the above findings IMPRESSION: 1. No acute intracranial pathology. 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 2. Patent vasculature of the head and neck with no significant stenosis or occlusion. 3. 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 4. Possible bilateral pulmonary emboli. Recommend dedicated CTA chest for further evaluation. Electronically Signed   By: Lesia Hausen M.D.   On: 01/09/2023 14:25   DG Chest 2 View  Result Date: 01/09/2023 CLINICAL DATA:  cough EXAM: CHEST - 2 VIEW COMPARISON:  11/16/2020. FINDINGS: Bilateral lung fields are clear. Redemonstration of elevated right hemidiaphragm. Bilateral costophrenic angles are clear. No acute osseous abnormalities. The soft tissues are within normal limits. IMPRESSION: No active cardiopulmonary disease. Electronically Signed   By: Jules Schick M.D.   On: 01/09/2023 14:23      Assessment/Plan 1. BLE PE  - Despite CTA findings, pt is maintaining oxygen saturation >90% without any oxygen supplementation so no immediate interventions needed - If pt's pulmonary status deteriorate will consider more urgent intervention - Anticoagulate on Heparin drip - Case d/w Dr. Wyn Quaker: planning on PE thrombectomy on  Monday   Leonides Sake, MD, [place], FSVS [phone])  covering for Festus Barren, MD  01/09/2023 9:41 PM    This note was created with Dragon medical transcription system.  Any error is purely unintentional

## 2023-01-10 ENCOUNTER — Inpatient Hospital Stay (HOSPITAL_COMMUNITY)
Admit: 2023-01-10 | Discharge: 2023-01-10 | Disposition: A | Discharge status: Home / Self Care | Payer: Medicare HMO | Attending: Internal Medicine | Admitting: Internal Medicine

## 2023-01-10 DIAGNOSIS — E782 Mixed hyperlipidemia: Secondary | ICD-10-CM

## 2023-01-10 DIAGNOSIS — I2699 Other pulmonary embolism without acute cor pulmonale: Secondary | ICD-10-CM | POA: Diagnosis not present

## 2023-01-10 DIAGNOSIS — E876 Hypokalemia: Secondary | ICD-10-CM | POA: Diagnosis not present

## 2023-01-10 DIAGNOSIS — I2609 Other pulmonary embolism with acute cor pulmonale: Secondary | ICD-10-CM

## 2023-01-10 DIAGNOSIS — E890 Postprocedural hypothyroidism: Secondary | ICD-10-CM

## 2023-01-10 DIAGNOSIS — I10 Essential (primary) hypertension: Secondary | ICD-10-CM

## 2023-01-10 DIAGNOSIS — Z6839 Body mass index (BMI) 39.0-39.9, adult: Secondary | ICD-10-CM

## 2023-01-10 LAB — HEPARIN LEVEL (UNFRACTIONATED)
Heparin Unfractionated: 0.8 IU/mL — ABNORMAL HIGH (ref 0.30–0.70)
Heparin Unfractionated: 0.91 IU/mL — ABNORMAL HIGH (ref 0.30–0.70)

## 2023-01-10 MED ORDER — POTASSIUM CHLORIDE CRYS ER 20 MEQ PO TBCR
40.0000 meq | EXTENDED_RELEASE_TABLET | Freq: Once | ORAL | Status: AC
  Administered 2023-01-10: 40 meq via ORAL
  Filled 2023-01-10: qty 2

## 2023-01-10 NOTE — Progress Notes (Signed)
ANTICOAGULATION CONSULT NOTE  Pharmacy Consult for heparin Indication: pulmonary embolus  Allergies  Allergen Reactions   Hydrochlorothiazide Palpitations    Jittery    Patient Measurements: Height: 5\' 5"  (165.1 cm) Weight: 112.9 kg (249 lb) IBW/kg (Calculated) : 57 Heparin Dosing Weight: 83.8 kg  Vital Signs: Temp: 97.5 F (36.4 C) (07/27 1239) Temp Source: Oral (07/27 0407) BP: 152/74 (07/27 1239) Pulse Rate: 87 (07/27 1239)  Labs: Recent Labs    01/09/23 1245 01/09/23 1600 01/09/23 1833 01/10/23 0334 01/10/23 1149  HGB 14.0  --   --  13.4  --   HCT 43.8  --   --  41.2  --   PLT 232  --   --  208  --   HEPARINUNFRC  --   --   --  0.40 0.80*  CREATININE 0.63  --   --  0.60  --   TROPONINIHS  --  6 8  --   --    Estimated Creatinine Clearance: 78.5 mL/min (by C-G formula based on SCr of 0.6 mg/dL).  Medical History: Past Medical History:  Diagnosis Date   Arthritis    Asthma    uses inhaler   Cataract    Hyperlipidemia    Hypertension    Keloid    Assessment: 72 y/o female presenting with nausea, vomiting, and dizziness. CTA of chest shows bilateral pulmonary emboli. Pharmacy has been consulted to dose heparin. Patient is not on anticoagulation prior to admission.  Goal of Therapy:  Heparin level 0.3-0.7 units/ml Monitor platelets by anticoagulation protocol: Yes   7/27 0334 HL 0.40, therapeutic x 1 7/27 1149 HL 0.80  Plan:  Heparin supratherapeutic, after therapeutic last draw with no dose adjustments, suspect falsely elevated. Only 1 IV access site for lab draws Will reduce heparin infusion slightly to 1350 units/hour Recheck Atni-Xa level in 8 hours  Monitor daily Anti-Xa levels while on heparin Monitor daily CBC and signs/symptoms of bleeding  Thank you for involving pharmacy in this patient's care.   Sharen Hones, PharmD, BCPS Clinical Pharmacist   01/10/2023 12:46 PM

## 2023-01-10 NOTE — Progress Notes (Signed)
Progress Note   Patient: Jessica Brooks ZOX:[id number] DOB: 1950-12-06 DOA: 01/09/2023     1 DOS: the patient was seen and examined on 01/10/2023   Brief hospital course: 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  Vitals in the emergency department showed temperature of 98.8, respiration rate of 16, heart rate of 101, blood pressure 159/91, SpO2 of 95% on room air.  Serum sodium is 137, potassium 3.3, chloride 102, bicarb 23, BUN of 9, serum creatinine is 2.63, EGFR greater than 60, nonfasting blood glucose 116, WBC 5.2, hemoglobin 14, platelets of 232.  Lactic acid was 1.1.  UA was negative for leukocytes and nitrates.  CTA head and neck w and w/o CM: No acute intracranial pathology. 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 Possible bilateral pulmonary emboli. Recommend dedicated CTA chest for further evaluation   ED treatment: Ondansetron 4 mg IV one-time dose, sodium chloride 1 L bolus.  7/27: Vital stable, on room air, CTA chest with large volume PE bilaterally with a concern of right heart strain. Also noted to have a 9.6 cm cystic lesion below the spleen, likely left kidney but it was not visualized properly.  Will get benefit from renal ultrasound or CT as nonemergent basis for further evaluation. Lower extremity venous Doppler positive for left peroneal thrombus. Vascular surgery was consulted and patient was started on heparin infusion. Vascular surgery planning thrombectomy on Monday.  Assessment and Plan: * Bilateral pulmonary embolism (HCC) Status post CT a of the chest for PE, per  radiology with CT evidence of right heart strain.  Echocardiogram 53-month with normal biventricular function.  Lower extremity venous Doppler positive for left peroneus thrombus. Patient is little sedentary, no recent travel or history of malignancy.  Colonoscopy done last year was normal except some internal hemorrhoids. Vascular surgery was consulted and patient will be going for mechanical thrombectomy on Monday.  Clinically stable. -Continue with heparin infusion -Continue to monitor  Hypokalemia Check magnesium level on admission was within normal limits Potassium at 3 this morning. 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    Subjective: Per patient she is not feeling well but unable to explain any symptoms.  Denies any chest pain or shortness of breath.  Mild nausea.  Physical Exam: Vitals:   01/10/23 0407 01/10/23 0730 01/10/23 0900 01/10/23 1239  BP: (!) 147/76 (!) 151/80  (!) 152/74  Pulse: 99 (!) 104  87  Resp: 18 18 17  (!) 22  Temp: 98.9 F (37.2 C) 98.3 F (36.8 C)  (!) 97.5 F (36.4 C)  TempSrc: Oral     SpO2: 93% 94%  96%  Weight:      Height:       General.  Morbidly obese lady, in no acute distress. Pulmonary.  Lungs clear bilaterally, normal respiratory effort. CV.  Regular rate and rhythm, no JVD, rub or murmur. Abdomen.  Soft,  nontender, nondistended, BS positive. CNS.  Alert and oriented .  No focal neurologic deficit. Extremities.  No edema, no cyanosis, pulses intact and symmetrical. Psychiatry.  Judgment and insight appears normal.   Data Reviewed: Prior records reviewed  Family Communication: Talked with daughter on phone.  Disposition: Status is: Inpatient Remains inpatient appropriate because: Severity of illness  Planned Discharge Destination: Home  Time spent: 50 minutes  This record has been created using Conservation officer, historic buildings. Errors have been sought and corrected,but may not always be located. Such creation errors do not reflect on the standard of care.   Author: Arnetha Courser, MD 01/10/2023 2:33 PM  For on call review [email or web address].

## 2023-01-10 NOTE — Plan of Care (Signed)
  Problem: Clinical Measurements: Goal: Ability to maintain clinical measurements within normal limits will improve Outcome: Progressing   Problem: Clinical Measurements: Goal: Respiratory complications will improve Outcome: Progressing   Problem: Clinical Measurements: Goal: Cardiovascular complication will be avoided Outcome: Progressing   Problem: Safety: Goal: Ability to remain free from injury will improve Outcome: Progressing   Problem: Pain Managment: Goal: General experience of comfort will improve Outcome: Progressing

## 2023-01-10 NOTE — Progress Notes (Signed)
ANTICOAGULATION CONSULT NOTE  Pharmacy Consult for Heparin Infusion Indication: pulmonary embolus  Allergies  Allergen Reactions   Hydrochlorothiazide Palpitations    Jittery    Patient Measurements: Height: 5\' 5"  (165.1 cm) Weight: 112.9 kg (249 lb) IBW/kg (Calculated) : 57 Heparin Dosing Weight: 83.8 kg  Vital Signs: Temp: 98 F (36.7 C) (07/27 1944) BP: 138/82 (07/27 1944) Pulse Rate: 95 (07/27 1944)  Labs: Recent Labs    01/09/23 1245 01/09/23 1600 01/09/23 1833 01/10/23 0334 01/10/23 1149 01/10/23 2030  HGB 14.0  --   --  13.4  --   --   HCT 43.8  --   --  41.2  --   --   PLT 232  --   --  208  --   --   HEPARINUNFRC  --   --   --  0.40 0.80* 0.91*  CREATININE 0.63  --   --  0.60  --   --   TROPONINIHS  --  6 8  --   --   --    Estimated Creatinine Clearance: 78.5 mL/min (by C-G formula based on SCr of 0.6 mg/dL).  Medical History: Past Medical History:  Diagnosis Date   Arthritis    Asthma    uses inhaler   Cataract    Hyperlipidemia    Hypertension    Keloid    Assessment: Jessica Brooks is a 72 y.o. female presenting with nausea, vomiting, and dizziness. Patient was not on anticoagulation prior to admission per chart review. CTA of chest shows bilateral pulmonary emboli. Pharmacy has been consulted to dose heparin.   Baseline Labs: Hgb 14.0, Hct 43.8, Plt 232   Goal of Therapy:  Heparin level 0.3-0.7 units/ml Monitor platelets by anticoagulation protocol: Yes    Date Time HL Rate/Comment  7/27 0334 0.40 1400/therapeutic x1 7/27 1149 0.80 1400/supratherapeutic 7/27 2030 0.91 1350/supratherapeutic  Plan:  Reduce heparin infusion to 1150 units/hr Check HL in 8 hours  Continue to monitor H&H and platelets daily while on heparin infusion   Thank you for involving pharmacy in this patient's care.   Celene Squibb, PharmD Clinical Pharmacist 01/10/2023 8:59 PM

## 2023-01-10 NOTE — Progress Notes (Signed)
[facility]  Daily Progress Note   Subjective  -   No chest pain, no SOB  Objective Vitals:   01/09/23 1930 01/09/23 2333 01/10/23 0407 01/10/23 0730  BP: (!) 150/86 137/71 (!) 147/76 (!) 151/80  Pulse: (!) 108 100 99 (!) 104  Resp: 18 20 18 18   Temp: 98.3 F (36.8 C) 98.7 F (37.1 C) 98.9 F (37.2 C) 98.3 F (36.8 C)  TempSrc:  Oral Oral   SpO2: 95% 97% 93% 94%  Weight:      Height:        Intake/Output Summary (Last 24 hours) at 01/10/2023 0903 Last data filed at 01/09/2023 1925 Gross per 24 hour  Intake 124.5 ml  Output --  Net 124.5 ml    PULM  CTAB CV  RRR VASC  No leg swelling  Laboratory CBC    Component Value Date/Time   WBC 5.3 01/10/2023 0334   HGB 13.4 01/10/2023 0334   HGB 13.0 09/30/2022 0953   HCT 41.2 01/10/2023 0334   HCT 40.8 09/30/2022 0953   PLT 208 01/10/2023 0334   PLT 273 09/30/2022 0953    BMET    Component Value Date/Time   NA 137 01/10/2023 0334   NA 140 09/30/2022 0953   K 3.0 (L) 01/10/2023 0334   CL 106 01/10/2023 0334   CO2 21 (L) 01/10/2023 0334   GLUCOSE 95 01/10/2023 0334   BUN 8 01/10/2023 0334   BUN 14 09/30/2022 0953   CREATININE 0.60 01/10/2023 0334   CALCIUM 8.5 (L) 01/10/2023 0334   GFRNONAA >60 01/10/2023 0334   GFRAA 102 08/07/2020 1114    Assessment/Planning: Asx B PE  Despite CTA Chest findings, pt is relatively asx currently Continue on Heparin drip Reportedly Dr. Wyn Quaker planning on PE thrombectomy on Monday Will preop tomorrow  Leonides Sake, MD, FACS, FSVS covering for [facility]  01/10/2023, 9:03 AM

## 2023-01-10 NOTE — Progress Notes (Signed)
  Echocardiogram 2D Echocardiogram has been performed.  Jessica Brooks 01/10/2023, 9:09 AM

## 2023-01-10 NOTE — Progress Notes (Signed)
ANTICOAGULATION CONSULT NOTE  Pharmacy Consult for heparin Indication: pulmonary embolus  Allergies  Allergen Reactions   Hydrochlorothiazide Palpitations    Jittery    Patient Measurements: Height: 5\' 5"  (165.1 cm) Weight: 112.9 kg (249 lb) IBW/kg (Calculated) : 57 Heparin Dosing Weight: 83.8 kg  Vital Signs: Temp: 98.7 F (37.1 C) (07/26 2333) Temp Source: Oral (07/26 2333) BP: 137/71 (07/26 2333) Pulse Rate: 100 (07/26 2333)  Labs: Recent Labs    01/09/23 1245 01/09/23 1600 01/09/23 1833 01/10/23 0334  HGB 14.0  --   --  13.4  HCT 43.8  --   --  41.2  PLT 232  --   --  208  HEPARINUNFRC  --   --   --  0.40  CREATININE 0.63  --   --  0.60  TROPONINIHS  --  6 8  --    Estimated Creatinine Clearance: 78.5 mL/min (by C-G formula based on SCr of 0.6 mg/dL).  Medical History: Past Medical History:  Diagnosis Date   Arthritis    Asthma    uses inhaler   Cataract    Hyperlipidemia    Hypertension    Keloid    Assessment: 72 y/o female presenting with nausea, vomiting, and dizziness. CTA of chest shows bilateral pulmonary emboli. Pharmacy has been consulted to dose heparin. Patient is not on anticoagulation prior to admission.  Goal of Therapy:  Heparin level 0.3-0.7 units/ml Monitor platelets by anticoagulation protocol: Yes   07/27 0334 HL 0.40, therapeutic x 1  Plan:  Continue heparin infusion at 1400 units/hour Recheck Atni-Xa level in 8 hours to confirm Monitor daily Anti-Xa levels while on heparin Monitor daily CBC and signs/symptoms of bleeding  Thank you for involving pharmacy in this patient's care.   Otelia Sergeant, PharmD, [facility] 01/10/2023 4:07 AM

## 2023-01-10 NOTE — Plan of Care (Signed)
  Problem: Education: Goal: Knowledge of General Education information will improve Description Including pain rating scale, medication(s)/side effects and non-pharmacologic comfort measures Outcome: Progressing   Problem: Nutrition: Goal: Adequate nutrition will be maintained Outcome: Progressing   Problem: Coping: Goal: Level of anxiety will decrease Outcome: Progressing   Problem: Safety: Goal: Ability to remain free from injury will improve Outcome: Progressing   Problem: Pain Managment: Goal: General experience of comfort will improve Outcome: Progressing

## 2023-01-11 DIAGNOSIS — I10 Essential (primary) hypertension: Secondary | ICD-10-CM | POA: Diagnosis not present

## 2023-01-11 DIAGNOSIS — J4520 Mild intermittent asthma, uncomplicated: Secondary | ICD-10-CM

## 2023-01-11 DIAGNOSIS — F418 Other specified anxiety disorders: Secondary | ICD-10-CM

## 2023-01-11 DIAGNOSIS — E876 Hypokalemia: Secondary | ICD-10-CM | POA: Diagnosis not present

## 2023-01-11 DIAGNOSIS — I2699 Other pulmonary embolism without acute cor pulmonale: Secondary | ICD-10-CM | POA: Diagnosis not present

## 2023-01-11 LAB — CBC
HCT: 38.7 % (ref 36.0–46.0)
Hemoglobin: 12.6 g/dL (ref 12.0–15.0)
MCH: 28.1 pg (ref 26.0–34.0)
MCHC: 32.6 g/dL (ref 30.0–36.0)
MCV: 86.2 fL (ref 80.0–100.0)
Platelets: 229 10*3/uL (ref 150–400)
RBC: 4.49 MIL/uL (ref 3.87–5.11)
RDW: 14.6 % (ref 11.5–15.5)
WBC: 4.6 10*3/uL (ref 4.0–10.5)
nRBC: 0 % (ref 0.0–0.2)

## 2023-01-11 LAB — HEPARIN LEVEL (UNFRACTIONATED)
Heparin Unfractionated: 0.69 IU/mL (ref 0.30–0.70)
Heparin Unfractionated: 0.69 IU/mL (ref 0.30–0.70)

## 2023-01-11 NOTE — Assessment & Plan Note (Signed)
Home antihypertensive medications include amlodipine 5 mg daily, lisinopril 5 mg daily Hydralazine 5 mg IV every 6 hours as needed for SBP greater 175, 5 days with

## 2023-01-11 NOTE — Progress Notes (Signed)
ANTICOAGULATION CONSULT NOTE  Pharmacy Consult for Heparin Infusion Indication: pulmonary embolus  Allergies  Allergen Reactions   Hydrochlorothiazide Palpitations    Jittery    Patient Measurements: Height: 5\' 5"  (165.1 cm) Weight: 112.9 kg (249 lb) IBW/kg (Calculated) : 57 Heparin Dosing Weight: 83.8 kg  Vital Signs: Temp: 97.7 F (36.5 C) (07/28 0453) BP: 146/66 (07/28 0453) Pulse Rate: 77 (07/28 0453)  Labs: Recent Labs    01/09/23 1245 01/09/23 1245 01/09/23 1600 01/09/23 1833 01/10/23 0334 01/10/23 1149 01/10/23 2030 01/11/23 0355 01/11/23 0458  HGB 14.0  --   --   --  13.4  --   --  12.6  --   HCT 43.8  --   --   --  41.2  --   --  38.7  --   PLT 232  --   --   --  208  --   --  229  --   HEPARINUNFRC  --    < >  --   --  0.40 0.80* 0.91*  --  0.69  CREATININE 0.63  --   --   --  0.60  --   --   --   --   TROPONINIHS  --   --  6 8  --   --   --   --   --    < > = values in this interval not displayed.   Estimated Creatinine Clearance: 78.5 mL/min (by C-G formula based on SCr of 0.6 mg/dL).  Medical History: Past Medical History:  Diagnosis Date   Arthritis    Asthma    uses inhaler   Cataract    Hyperlipidemia    Hypertension    Keloid    Assessment: Jessica Brooks is a 72 y.o. female presenting with nausea, vomiting, and dizziness. Patient was not on anticoagulation prior to admission per chart review. CTA of chest shows bilateral pulmonary emboli. Pharmacy has been consulted to dose heparin.   Baseline Labs: Hgb 14.0, Hct 43.8, Plt 232   Goal of Therapy:  Heparin level 0.3-0.7 units/ml Monitor platelets by anticoagulation protocol: Yes    Date Time HL Rate/Comment  7/27 0334 0.40 1400/therapeutic x1 7/27 1149 0.80 1400/supratherapeutic 7/27 2030 0.91 1350/supratherapeutic 7/28 0458 0.69 Therapeutic x 1  Plan:  Continue heparin infusion at 1150 units/hr Recheck HL in 8 hours to confirm  Continue to monitor H&H and platelets  daily while on heparin infusion   Thank you for involving pharmacy in this patient's care.   Otelia Sergeant, PharmD, [facility] 01/11/2023 5:55 AM

## 2023-01-11 NOTE — Progress Notes (Signed)
[facility]  Daily Progress Note   Subjective  -   No SOB, no chest pain  Objective Vitals:   01/10/23 1944 01/11/23 0012 01/11/23 0453 01/11/23 0847  BP: 138/82 132/70 (!) 146/66 120/60  Pulse: 95 82 77 85  Resp: 18 16 16 20   Temp: 98 F (36.7 C) 97.8 F (36.6 C) 97.7 F (36.5 C) 98.2 F (36.8 C)  TempSrc:    Oral  SpO2: 94% 98% 99% 99%  Weight:      Height:        Intake/Output Summary (Last 24 hours) at 01/11/2023 1014 Last data filed at 01/10/2023 1628 Gross per 24 hour  Intake 902.56 ml  Output 350 ml  Net 552.56 ml    PULM  CTAB, no oxygen CV  RRR, no tachycardia  Laboratory CBC    Component Value Date/Time   WBC 4.6 01/11/2023 0355   HGB 12.6 01/11/2023 0355   HGB 13.0 09/30/2022 0953   HCT 38.7 01/11/2023 0355   HCT 40.8 09/30/2022 0953   PLT 229 01/11/2023 0355   PLT 273 09/30/2022 0953    BMET    Component Value Date/Time   NA 137 01/10/2023 0334   NA 140 09/30/2022 0953   K 3.0 (L) 01/10/2023 0334   CL 106 01/10/2023 0334   CO2 21 (L) 01/10/2023 0334   GLUCOSE 95 01/10/2023 0334   BUN 8 01/10/2023 0334   BUN 14 09/30/2022 0953   CREATININE 0.60 01/10/2023 0334   CALCIUM 8.5 (L) 01/10/2023 0334   GFRNONAA >60 01/10/2023 0334   GFRAA 102 08/07/2020 1114    Assessment/Planning: BIL PE with radiographic evidence of Rt heart strain  Informed consent conversation with patient in regards to pulmonary artery thrombectomy  Patient is aware risks include: access complications including bleeding, infection, nerve damage, need for secondary procedures to repair access injuries, contrast related complications including contrast induced nephropathy and allergic reaction, intrapulmonary complications including vessel injuries, inadequate thrombectomy, and general complications including myocardial infarction, stroke, and death. The patient is aware of such risk and elects to proceed. Keep pt NPO after midnight.  Preop labs  drawn. Continue running Heparin drip  Levora Dredge  01/11/2023, 10:14 AM

## 2023-01-11 NOTE — Plan of Care (Signed)

## 2023-01-11 NOTE — Plan of Care (Signed)
  Problem: Education: Goal: Knowledge of General Education information will improve Description: Including pain rating scale, medication(s)/side effects and non-pharmacologic comfort measures Outcome: Progressing   Problem: Clinical Measurements: Goal: Ability to maintain clinical measurements within normal limits will improve Outcome: Progressing   Problem: Clinical Measurements: Goal: Respiratory complications will improve Outcome: Progressing   Problem: Clinical Measurements: Goal: Cardiovascular complication will be avoided Outcome: Progressing   Problem: Nutrition: Goal: Adequate nutrition will be maintained Outcome: Progressing   Problem: Pain Managment: Goal: General experience of comfort will improve Outcome: Progressing   Problem: Safety: Goal: Ability to remain free from injury will improve Outcome: Progressing

## 2023-01-11 NOTE — Assessment & Plan Note (Addendum)
Status post CT a of the chest for PE, per radiology with CT evidence of right heart strain.  Echocardiogram with normal biventricular function.  Lower extremity venous Doppler positive for left peroneus thrombus. Patient is little sedentary, no recent travel or history of malignancy.  Colonoscopy done last year was normal except some internal hemorrhoids. S/p mechanical thrombectomy by vascular surgery on 01/12/2023 -Switched heparin with Eliquis -Continue to monitor

## 2023-01-11 NOTE — Assessment & Plan Note (Signed)
Resolved -Replete potassium as needed and monitor

## 2023-01-11 NOTE — Progress Notes (Signed)
ANTICOAGULATION CONSULT NOTE  Pharmacy Consult for Heparin Infusion Indication: pulmonary embolus  Patient Measurements: Height: 5\' 5"  (165.1 cm) Weight: 112.9 kg (249 lb) IBW/kg (Calculated) : 57 Heparin Dosing Weight: 83.8 kg  Labs: Recent Labs    01/09/23 1245 01/09/23 1600 01/09/23 1833 01/10/23 0334 01/10/23 1149 01/10/23 2030 01/11/23 0355 01/11/23 0458 01/11/23 1304  HGB 14.0  --   --  13.4  --   --  12.6  --   --   HCT 43.8  --   --  41.2  --   --  38.7  --   --   PLT 232  --   --  208  --   --  229  --   --   HEPARINUNFRC  --   --   --  0.40   < > 0.91*  --  0.69 0.69  CREATININE 0.63  --   --  0.60  --   --   --   --   --   TROPONINIHS  --  6 8  --   --   --   --   --   --    < > = values in this interval not displayed.   Estimated Creatinine Clearance: 78.5 mL/min (by C-G formula based on SCr of 0.6 mg/dL).  Medical History: Past Medical History:  Diagnosis Date   Arthritis    Asthma    uses inhaler   Cataract    Hyperlipidemia    Hypertension    Keloid    Assessment: Jessica Brooks is a 72 y.o. female presenting with nausea, vomiting, and dizziness. Patient was not on anticoagulation prior to admission per chart review. CTA of chest shows bilateral pulmonary emboli. Pharmacy has been consulted to dose heparin.   Baseline Labs: Hgb 14.0, Hct 43.8, Plt 232   Goal of Therapy:  Heparin level 0.3-0.7 units/ml Monitor platelets by anticoagulation protocol: Yes    Date Time HL Rate/Comment  7/27 0334 0.40 1400/therapeutic x1 7/27 1149 0.80 1400/supratherapeutic 7/27 2030 0.91 1350/supratherapeutic 7/28 0458 0.69 Therapeutic x 1 7/28 1304 0.69 Therapeutic x 2  Plan:  Continue heparin infusion at 1150 units/hr Recheck HL tomorrow AM Continue to monitor H&H and platelets daily while on heparin infusion  Thank you for involving pharmacy in this patient's care.   Tressie Ellis 01/11/2023 2:11 PM

## 2023-01-11 NOTE — Progress Notes (Signed)
Progress Note   Patient: Jessica Brooks ZOX:[id number] DOB: 01/12/1951 DOA: 01/09/2023     2 DOS: the patient was seen and examined on 01/11/2023   Brief hospital course: Jessica Brooks is a 72 year old female with history of hypertension, prior left cerebellar infarct that was asymptomatic and found as an incidental finding on MRI, who presents to the emergency department for chief concerns of nausea, vomiting, dizziness.  Vitals in the emergency department showed temperature of 98.8, respiration rate of 16, heart rate of 101, blood pressure 159/91, SpO2 of 95% on room air.  Serum sodium is 137, potassium 3.3, chloride 102, bicarb 23, BUN of 9, serum creatinine is 2.63, EGFR greater than 60, nonfasting blood glucose 116, WBC 5.2, hemoglobin 14, platelets of 232.  Lactic acid was 1.1.  UA was negative for leukocytes and nitrates.  CTA head and neck w and w/o CM: No acute intracranial pathology. 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 Possible bilateral pulmonary emboli. Recommend dedicated CTA chest for further evaluation   ED treatment: Ondansetron 4 mg IV one-time dose, sodium chloride 1 L bolus.  7/27: Vital stable, on room air, CTA chest with large volume PE bilaterally with a concern of right heart strain. Also noted to have a 9.6 cm cystic lesion below the spleen, likely left kidney but it was not visualized properly.  Will get benefit from renal ultrasound or CT as nonemergent basis for further evaluation. Lower extremity venous Doppler positive for left peroneal thrombus. Vascular surgery was consulted and patient was started on heparin infusion. Vascular surgery planning thrombectomy on Monday.  7/28: Hemodynamically stable, thrombectomy likely tomorrow.  Assessment and Plan: * Bilateral pulmonary  embolism (HCC) Status post CT a of the chest for PE, per radiology with CT evidence of right heart strain.  Echocardiogram with normal biventricular function.  Lower extremity venous Doppler positive for left peroneus thrombus. Patient is little sedentary, no recent travel or history of malignancy.  Colonoscopy done last year was normal except some internal hemorrhoids. Vascular surgery was consulted and patient will be going for mechanical thrombectomy on Monday.  Clinically stable. -Continue with heparin infusion -Continue to monitor  Hypokalemia -Replete potassium and monitor  Hypertension Home antihypertensive medications include amlodipine 5 mg daily, lisinopril 5 mg daily Hydralazine 5 mg IV every 6 hours as needed for SBP greater 175, 5 days with  Asthma Does not appear to be in acute exacerbation Albuterol nebulizer every 6 hours as needed for shortness of breath and wheezing, 5 days ordered  Postablative hypothyroidism Levothyroxine 100 mcg daily before breakfast  Mixed hyperlipidemia Rosuvastatin 10 mg daily  Situational anxiety Ativan 0.5 mg IV every 6 hours as needed for anxiety, 1 day ordered  Obesity, Class III, BMI 40-49.9 (morbid obesity) (HCC) This complicates overall care and prognosis.    Subjective: Patient was seen and examined today.  No new concern.  Some pleuritic chest pain yesterday.  Denies any shortness of breath..  Physical Exam: Vitals:   01/10/23 1944 01/11/23 0012 01/11/23 0453 01/11/23 0847  BP: 138/82 132/70 (!) 146/66 120/60  Pulse: 95 82 77 85  Resp: 18 16 16 20   Temp: 98 F (36.7 C) 97.8 F (36.6 C) 97.7 F (36.5 C) 98.2 F (36.8 C)  TempSrc:    Oral  SpO2: 94% 98% 99% 99%  Weight:      Height:  General.  Morbidly obese lady, in no acute distress. Pulmonary.  Lungs clear bilaterally, normal respiratory effort. CV.  Regular rate and rhythm, no JVD, rub or murmur. Abdomen.  Soft, nontender, nondistended, BS positive. CNS.   Alert and oriented .  No focal neurologic deficit. Extremities.  No edema, no cyanosis, pulses intact and symmetrical. Psychiatry.  Judgment and insight appears normal.    Data Reviewed: Prior records reviewed  Family Communication: Talked with daughter on phone.  Disposition: Status is: Inpatient Remains inpatient appropriate because: Severity of illness  Planned Discharge Destination: Home  Time spent: 45 minutes  This record has been created using Conservation officer, historic buildings. Errors have been sought and corrected,but may not always be located. Such creation errors do not reflect on the standard of care.   Author: Arnetha Courser, MD 01/11/2023 1:07 PM  For on call review [email or web address].

## 2023-01-12 ENCOUNTER — Encounter
Admission: EM | Disposition: A | Discharge status: Home / Self Care | Payer: Self-pay | Source: Home / Self Care | Attending: Internal Medicine

## 2023-01-12 ENCOUNTER — Encounter: Payer: Self-pay | Admitting: Vascular Surgery

## 2023-01-12 DIAGNOSIS — E876 Hypokalemia: Secondary | ICD-10-CM | POA: Diagnosis not present

## 2023-01-12 DIAGNOSIS — I2699 Other pulmonary embolism without acute cor pulmonale: Secondary | ICD-10-CM | POA: Diagnosis not present

## 2023-01-12 DIAGNOSIS — I10 Essential (primary) hypertension: Secondary | ICD-10-CM | POA: Diagnosis not present

## 2023-01-12 DIAGNOSIS — J4520 Mild intermittent asthma, uncomplicated: Secondary | ICD-10-CM | POA: Diagnosis not present

## 2023-01-12 HISTORY — PX: PULMONARY THROMBECTOMY: CATH118295

## 2023-01-12 SURGERY — PULMONARY THROMBECTOMY
Anesthesia: Moderate Sedation | Laterality: Bilateral

## 2023-01-12 MED ORDER — MIDAZOLAM HCL 2 MG/2ML IJ SOLN
INTRAMUSCULAR | Status: DC | PRN
  Administered 2023-01-12: 1 mg via INTRAVENOUS
  Administered 2023-01-12: 2 mg via INTRAVENOUS

## 2023-01-12 MED ORDER — LIDOCAINE HCL (PF) 1 % IJ SOLN
INTRAMUSCULAR | Status: DC | PRN
  Administered 2023-01-12: 10 mL

## 2023-01-12 MED ORDER — SODIUM CHLORIDE 0.9 % IV SOLN: INTRAVENOUS | Status: DC

## 2023-01-12 MED ORDER — HEPARIN SODIUM (PORCINE) 1000 UNIT/ML IJ SOLN
INTRAMUSCULAR | Status: AC
  Filled 2023-01-12: qty 10

## 2023-01-12 MED ORDER — HEPARIN (PORCINE) IN NACL 2000-0.9 UNIT/L-% IV SOLN
INTRAVENOUS | Status: DC | PRN
  Administered 2023-01-12: 1000 mL

## 2023-01-12 MED ORDER — METHYLPREDNISOLONE SODIUM SUCC 125 MG IJ SOLR: 125.0000 mg | Freq: Once | INTRAMUSCULAR | Status: DC | PRN

## 2023-01-12 MED ORDER — FENTANYL CITRATE (PF) 100 MCG/2ML IJ SOLN
INTRAMUSCULAR | Status: AC
  Filled 2023-01-12: qty 2

## 2023-01-12 MED ORDER — MIDAZOLAM HCL 2 MG/ML PO SYRP: 8.0000 mg | ORAL_SOLUTION | Freq: Once | ORAL | Status: DC | PRN

## 2023-01-12 MED ORDER — ONDANSETRON HCL 4 MG/2ML IJ SOLN: 4.0000 mg | Freq: Four times a day (QID) | INTRAMUSCULAR | Status: DC | PRN

## 2023-01-12 MED ORDER — FAMOTIDINE 20 MG PO TABS: 40.0000 mg | ORAL_TABLET | Freq: Once | ORAL | Status: DC | PRN

## 2023-01-12 MED ORDER — MIDAZOLAM HCL 5 MG/5ML IJ SOLN
INTRAMUSCULAR | Status: AC
  Filled 2023-01-12: qty 5

## 2023-01-12 MED ORDER — CEFAZOLIN SODIUM-DEXTROSE 2-4 GM/100ML-% IV SOLN
2.0000 g | INTRAVENOUS | Status: AC
  Administered 2023-01-12: 2 g via INTRAVENOUS

## 2023-01-12 MED ORDER — FENTANYL CITRATE PF 50 MCG/ML IJ SOSY: 12.5000 ug | PREFILLED_SYRINGE | Freq: Once | INTRAMUSCULAR | Status: DC | PRN

## 2023-01-12 MED ORDER — CEFAZOLIN SODIUM-DEXTROSE 2-4 GM/100ML-% IV SOLN
INTRAVENOUS | Status: AC
  Filled 2023-01-12: qty 100

## 2023-01-12 MED ORDER — DIPHENHYDRAMINE HCL 50 MG/ML IJ SOLN: 50.0000 mg | Freq: Once | INTRAMUSCULAR | Status: DC | PRN

## 2023-01-12 MED ORDER — IODIXANOL 320 MG/ML IV SOLN
INTRAVENOUS | Status: DC | PRN
  Administered 2023-01-12: 55 mL

## 2023-01-12 MED ORDER — HEPARIN SODIUM (PORCINE) 1000 UNIT/ML IJ SOLN
INTRAMUSCULAR | Status: DC | PRN
  Administered 2023-01-12: 4000 [IU] via INTRAVENOUS

## 2023-01-12 MED ORDER — FENTANYL CITRATE (PF) 100 MCG/2ML IJ SOLN
INTRAMUSCULAR | Status: DC | PRN
  Administered 2023-01-12: 25 ug via INTRAVENOUS
  Administered 2023-01-12: 50 ug via INTRAVENOUS

## 2023-01-12 MED ORDER — HYDROMORPHONE HCL 1 MG/ML IJ SOLN: 1.0000 mg | Freq: Once | INTRAMUSCULAR | Status: DC | PRN

## 2023-01-12 SURGICAL SUPPLY — 18 items
CANISTER PENUMBRA ENGINE (MISCELLANEOUS) IMPLANT
CATH ANGIO 5F PIGTAIL 100CM (CATHETERS) IMPLANT
CATH INDIGO 12XTORQ 100 (CATHETERS) IMPLANT
CATH INDIGO SEP 12 (CATHETERS) IMPLANT
CATH SELECT BERN TIP 5F 130 (CATHETERS) IMPLANT
CLOSURE PERCLOSE PROSTYLE (VASCULAR PRODUCTS) IMPLANT
COVER EZ STRL 42X30 (DRAPES) IMPLANT
GLIDEWIRE ADV .035X260CM (WIRE) IMPLANT
PACK ANGIOGRAPHY (CUSTOM PROCEDURE TRAY) ×1 IMPLANT
PANNUS RETENTION SYSTEM 2 PAD (MISCELLANEOUS) IMPLANT
SHEATH BRITE TIP 6FRX11 (SHEATH) IMPLANT
SHEATH FAST CATH 12F 12CM (SHEATH) IMPLANT
SUT MNCRL AB 4-0 PS2 18 (SUTURE) IMPLANT
SUT PROLENE 0 CT 1 30 (SUTURE) IMPLANT
SYR MEDRAD MARK 7 150ML (SYRINGE) IMPLANT
TUBING CONTRAST HIGH PRESS 72 (TUBING) IMPLANT
WIRE GUIDERIGHT .035X150 (WIRE) IMPLANT
WIRE SUPRACORE 300CM (WIRE) IMPLANT

## 2023-01-12 NOTE — Care Management Important Message (Signed)
Important Message  Patient Details  Name: DOLOROS IGOE MRN: [id number] Date of Birth: March 06, 1951   Medicare Important Message Given:  Yes     Bernadette Hoit 01/12/2023, 3:32 PM

## 2023-01-12 NOTE — Plan of Care (Signed)

## 2023-01-12 NOTE — Op Note (Signed)
[facility]  Percutaneous Study/Intervention Procedural Note   Date of Surgery: 01/12/2023,2:41 PM  Surgeon: Festus Barren  Pre-operative Diagnosis: Symptomatic bilateral pulmonary emboli  Post-operative diagnosis:  Same  Procedure(s) Performed:  1.  Contrast injection right heart  2.  Mechanical thrombectomy using the penumbra CAT 12 catheter to the right upper lobe, middle lobe, and lower lobe pulmonary arteries as well as left upper lobe and lower lobe pulmonary arteries  3.  Selective catheter placement right lower lobe, middle lobe, and upper lobe pulmonary arteries  4.  Selective catheter placement left upper and lower lobe pulmonary arteries    Anesthesia: Conscious sedation was administered under my direct supervision by the interventional radiology RN. IV Versed plus fentanyl were utilized. Continuous ECG, pulse oximetry and blood pressure was monitored throughout the entire procedure.  Versed and fentanyl were administered intravenously.  Conscious sedation was administered for a total of 38 minutes using 3 mg of Versed and 75 mcg of Fentanyl.  EBL: 375 cc  Sheath: 12 French right femoral vein  Contrast: 55 cc   Fluoroscopy Time: 9.5 minutes  Indications:  Patient presents with pulmonary emboli. The patient is symptomatic with hypoxemia and dyspnea on exertion.  There is evidence of right heart strain on the CT angiogram. The patient is otherwise a good candidate for intervention and even the long-term benefits pulmonary angiography with thrombectomy is offered. The risks and benefits are reviewed long-term benefits are discussed. All questions are answered patient agrees to proceed.  Procedure:  Keiani Kitchin Degraffenridtis a 72 y.o. female who was identified and appropriate procedural time out was performed.  The patient was then placed supine on the table and prepped and draped in the usual sterile fashion.  Ultrasound was used to evaluate the right common  femoral vein.  It was patent, as it was echolucent and compressible.  A digital ultrasound image was acquired for the permanent record.  A Seldinger needle was used to access the right common femoral vein under direct ultrasound guidance.  A 0.035 J wire was advanced without resistance and a 5Fr sheath was placed.  A ProGlide device was placed in a preclose fashion and then upsized to a 12 [place] sheath.    The wire and pigtail catheter were then negotiated into the right atrium and bolus injection of contrast was utilized to demonstrate the right ventricle and the pulmonary artery outflow. The advantage wire wire and pigtail catheter were then negotiated into the left main pulmonary artery and then into the left upper and lower lobe pulmonary arteries where hand injection of contrast was utilized to demonstrate the pulmonary arteries and confirm the locations of the pulmonary emboli.  Significant thrombus burden was seen on the left side in both the lower and upper lobe pulmonary arteries although image quality was fairly poor.  I then transition to the right side and into the right lower lobe, middle lobe, and upper lobe pulmonary arteries for selective imaging.  Significant thrombus burden was seen in all 3 lobes on the right side.  4000 Units of heparin was then given and allowed to circulate.   The Penumbra Cat 12 catheter was then advanced up into the pulmonary vasculature. The right lung was addressed first. Catheter was negotiated into the right lower lobe and mechanical thrombectomy was performed with the help of a separator.  Follow-up imaging demonstrated a good result and therefore the catheter was renegotiated into the right middle lobe pulmonary artery and again mechanical thrombectomy was  performed. Passes were made with both the Penumbra catheter itself as well as introducing the separator.  Finally, I was able to gain access to the right upper lobe pulmonary artery and mechanical thrombectomy  was again performed with the help of the separator.  Follow-up imaging was then performed.  Significant improvement was seen so I elected to move to the left side.  The Penumbra Cat 12 catheter was then negotiated to the opposite side. The left lung was then addressed. Catheter was negotiated into the left upper lobe and mechanical thrombectomy was performed with the help of the separator. Follow-up imaging demonstrated a good result and therefore the catheter was renegotiated into the left lower lobe pulmonary artery and again mechanical thrombectomy was performed. Passes were made with both the Penumbra catheter itself as well as introducing the separator. Follow-up imaging was then performed.  Significant improvement was seen and so I elected to terminate the procedure.  After review these images wires were reintroduced and the catheter is removed. Then, the sheath is then pulled, the Pro-glide device is secured, and pressure is held. A safeguard is placed.    Findings:   Right heart imaging:  Right atrium and right ventricle and the pulmonary outflow tract appears dilated  Right lung:  Significant thrombus burden was seen in all 3 lobes on the right side.  Left lung:  Significant thrombus burden was seen on the left side in both the lower and upper lobe pulmonary arteries     Disposition: Patient was taken to the recovery room in stable condition having tolerated the procedure well.  Nilson Tabora 01/12/2023,2:41 PM

## 2023-01-12 NOTE — Progress Notes (Signed)
Progress Note   Patient: ALIYAHA AULAKH WUJ:[id number] DOB: November 21, 1950 DOA: 01/09/2023     3 DOS: the patient was seen and examined on 01/12/2023   Brief hospital course: Ms. 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  Vitals in the emergency department showed temperature of 98.8, respiration rate of 16, heart rate of 101, blood pressure 159/91, SpO2 of 95% on room air.  Serum sodium is 137, potassium 3.3, chloride 102, bicarb 23, BUN of 9, serum creatinine is 2.63, EGFR greater than 60, nonfasting blood glucose 116, WBC 5.2, hemoglobin 14, platelets of 232.  Lactic acid was 1.1.  UA was negative for leukocytes and nitrates.  CTA head and neck w and w/o CM: No acute intracranial pathology. 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 Possible bilateral pulmonary emboli. Recommend dedicated CTA chest for further evaluation   ED treatment: Ondansetron 4 mg IV one-time dose, sodium chloride 1 L bolus.  7/27: Vital stable, on room air, CTA chest with large volume PE bilaterally with a concern of right heart strain. Also noted to have a 9.6 cm cystic lesion below the spleen, likely left kidney but it was not visualized properly.  Will get benefit from renal ultrasound or CT as nonemergent basis for further evaluation. Lower extremity venous Doppler positive for left peroneal thrombus. Vascular surgery was consulted and patient was started on heparin infusion. Vascular surgery planning thrombectomy on Monday.  7/28: Hemodynamically stable, thrombectomy likely tomorrow. 7/29: Remained stable.  Awaiting thrombectomy  later today.  Assessment and Plan: * Bilateral pulmonary embolism (HCC) Status post CT a of the chest for PE, per radiology with CT evidence of right heart strain.  Echocardiogram with normal biventricular function.  Lower extremity venous Doppler positive for left peroneus thrombus. Patient is little sedentary, no recent travel or history of malignancy.  Colonoscopy done last year was normal except some internal hemorrhoids. Vascular surgery was consulted and patient will be going for mechanical thrombectomy today.  Clinically stable. -Continue with heparin infusion -Continue to monitor  Hypokalemia Resolved -Replete potassium as needed and monitor  Hypertension Home antihypertensive medications include amlodipine 5 mg daily, lisinopril 5 mg daily Hydralazine 5 mg IV every 6 hours as needed for SBP greater 175, 5 days with  Asthma Does not appear to be in acute exacerbation Albuterol nebulizer every 6 hours as needed for shortness of breath and wheezing, 5 days ordered  Postablative hypothyroidism Levothyroxine 100 mcg daily before breakfast  Mixed hyperlipidemia Rosuvastatin 10 mg daily  Situational anxiety Ativan 0.5 mg IV every 6 hours as needed for anxiety, 1 day ordered  Obesity, Class III, BMI 40-49.9 (morbid obesity) (HCC) This complicates overall care and prognosis.    Subjective: Patient with no new concern.  No shortness of breath.  2 daughters at bedside, awaiting procedure later today.  Physical Exam: Vitals:   01/12/23 0327 01/12/23 0831 01/12/23 1140 01/12/23 1228  BP: 136/67 (!) 140/70 114/76 (!) 142/74  Pulse: 83 85 84 86  Resp: 18 20 (!) 21 19  Temp: 98 F (36.7 C) 98.3 F (36.8 C) 98.2 F (36.8 C) 98.1 F (36.7 C)  TempSrc:      SpO2: 98% 94% 96% 98%  Weight:  Height:       General.  Morbidly obese lady, in no acute distress. Pulmonary.  Lungs clear bilaterally, normal respiratory effort. CV.  Regular rate and rhythm, no JVD, rub or  murmur. Abdomen.  Soft, nontender, nondistended, BS positive. CNS.  Alert and oriented .  No focal neurologic deficit. Extremities.  No edema, no cyanosis, pulses intact and symmetrical. Psychiatry.  Judgment and insight appears normal.   Data Reviewed: Prior records reviewed  Family Communication: Discussed with 2 daughters at bedside  Disposition: Status is: Inpatient Remains inpatient appropriate because: Severity of illness  Planned Discharge Destination: Home  Time spent: 42 minutes  This record has been created using Conservation officer, historic buildings. Errors have been sought and corrected,but may not always be located. Such creation errors do not reflect on the standard of care.   Author: Arnetha Courser, MD 01/12/2023 12:58 PM  For on call review [email or web address].

## 2023-01-12 NOTE — Interval H&P Note (Signed)
History and Physical Interval Note:  01/12/2023 1:21 PM  Jessica Brooks  has presented today for surgery, with the diagnosis of B PE.  The various methods of treatment have been discussed with the patient and family. After consideration of risks, benefits and other options for treatment, the patient has consented to  Procedure(s): PULMONARY THROMBECTOMY (Bilateral) as a surgical intervention.  The patient's history has been reviewed, patient examined, no change in status, stable for surgery.  I have reviewed the patient's chart and labs.  Questions were answered to the patient's satisfaction.     Festus Barren

## 2023-01-12 NOTE — Progress Notes (Signed)
ANTICOAGULATION CONSULT NOTE  Pharmacy Consult for Heparin Infusion Indication: pulmonary embolus  Patient Measurements: Height: 5\' 5"  (165.1 cm) Weight: 112.9 kg (249 lb) IBW/kg (Calculated) : 57 Heparin Dosing Weight: 83.8 kg  Labs: Recent Labs    01/09/23 1245 01/09/23 1600 01/09/23 1833 01/10/23 0334 01/10/23 1149 01/11/23 0355 01/11/23 0458 01/11/23 1304 01/12/23 0340  HGB 14.0  --   --  13.4  --  12.6  --   --  12.8  HCT 43.8  --   --  41.2  --  38.7  --   --  38.0  PLT 232  --   --  208  --  229  --   --  229  HEPARINUNFRC  --   --   --  0.40   < >  --  0.69 0.69 0.59  CREATININE 0.63  --   --  0.60  --   --   --   --  0.72  TROPONINIHS  --  6 8  --   --   --   --   --   --    < > = values in this interval not displayed.   Estimated Creatinine Clearance: 78.5 mL/min (by C-G formula based on SCr of 0.72 mg/dL).  Medical History: Past Medical History:  Diagnosis Date   Arthritis    Asthma    uses inhaler   Cataract    Hyperlipidemia    Hypertension    Keloid    Assessment: Jessica Brooks is a 72 y.o. female presenting with nausea, vomiting, and dizziness. Patient was not on anticoagulation prior to admission per chart review. CTA of chest shows bilateral pulmonary emboli. Pharmacy has been consulted to dose heparin.   Baseline Labs: Hgb 14.0, Hct 43.8, Plt 232   Goal of Therapy:  Heparin level 0.3-0.7 units/ml Monitor platelets by anticoagulation protocol: Yes    Date Time HL Rate/Comment  7/27 0334 0.40 1400/therapeutic x1 7/27 1149 0.80 1400/supratherapeutic 7/27 2030 0.91 1350/supratherapeutic 7/28 0458 0.69 Therapeutic x 1 7/28 1304 0.69 Therapeutic x 2 7/29 0340 0.59 Therapeutic x 3  Plan:  Continue heparin infusion at 1150 units/hr Recheck HL tomorrow AM Continue to monitor H&H and platelets daily while on heparin infusion  Thank you for involving pharmacy in this patient's care.   Otelia Sergeant, PharmD, [facility] 01/12/2023 5:12  AM  5:12 AM

## 2023-01-13 ENCOUNTER — Encounter (HOSPITAL_BASED_OUTPATIENT_CLINIC_OR_DEPARTMENT_OTHER): Payer: Self-pay

## 2023-01-13 ENCOUNTER — Other Ambulatory Visit (HOSPITAL_COMMUNITY): Payer: Self-pay

## 2023-01-13 DIAGNOSIS — E876 Hypokalemia: Secondary | ICD-10-CM | POA: Diagnosis not present

## 2023-01-13 DIAGNOSIS — I2699 Other pulmonary embolism without acute cor pulmonale: Secondary | ICD-10-CM | POA: Diagnosis not present

## 2023-01-13 DIAGNOSIS — I10 Essential (primary) hypertension: Secondary | ICD-10-CM | POA: Diagnosis not present

## 2023-01-13 DIAGNOSIS — R42 Dizziness and giddiness: Secondary | ICD-10-CM

## 2023-01-13 LAB — CBC
HCT: 34.9 % — ABNORMAL LOW (ref 36.0–46.0)
Hemoglobin: 11.8 g/dL — ABNORMAL LOW (ref 12.0–15.0)
MCH: 28.6 pg (ref 26.0–34.0)
MCHC: 33.8 g/dL (ref 30.0–36.0)
MCV: 84.5 fL (ref 80.0–100.0)
Platelets: 220 10*3/uL (ref 150–400)
RBC: 4.13 MIL/uL (ref 3.87–5.11)
RDW: 14.6 % (ref 11.5–15.5)
WBC: 5.5 10*3/uL (ref 4.0–10.5)
nRBC: 0 % (ref 0.0–0.2)

## 2023-01-13 LAB — BASIC METABOLIC PANEL WITH GFR
Anion gap: 7 (ref 5–15)
BUN: 14 mg/dL (ref 8–23)
CO2: 24 mmol/L (ref 22–32)
Calcium: 8.5 mg/dL — ABNORMAL LOW (ref 8.9–10.3)
Chloride: 108 mmol/L (ref 98–111)
Creatinine, Ser: 0.58 mg/dL (ref 0.44–1.00)
GFR, Estimated: >60 mL/min
Glucose, Bld: 97 mg/dL (ref 70–99)
Potassium: 3.7 mmol/L (ref 3.5–5.1)
Sodium: 139 mmol/L (ref 135–145)

## 2023-01-13 MED ORDER — APIXABAN 5 MG PO TABS: 5.0000 mg | ORAL_TABLET | Freq: Two times a day (BID) | ORAL | 1 refills | Status: DC

## 2023-01-13 MED ORDER — APIXABAN (ELIQUIS) VTE STARTER PACK (10MG AND 5MG): ORAL_TABLET | ORAL | 0 refills | Status: DC

## 2023-01-13 MED ORDER — APIXABAN 5 MG PO TABS: 5.0000 mg | ORAL_TABLET | Freq: Two times a day (BID) | ORAL | Status: DC

## 2023-01-13 MED ORDER — APIXABAN 5 MG PO TABS
10.0000 mg | ORAL_TABLET | Freq: Two times a day (BID) | ORAL | Status: DC
  Administered 2023-01-13: 10 mg via ORAL
  Filled 2023-01-13: qty 2

## 2023-01-13 MED ORDER — FE FUM-VIT C-VIT B12-FA 460-60-0.01-1 MG PO CAPS: 1.0000 | ORAL_CAPSULE | Freq: Every day | ORAL | 0 refills | Status: AC

## 2023-01-13 MED ORDER — FE FUM-VIT C-VIT B12-FA 460-60-0.01-1 MG PO CAPS
1.0000 | ORAL_CAPSULE | Freq: Every day | ORAL | Status: DC
  Administered 2023-01-13: 1 via ORAL
  Filled 2023-01-13: qty 1

## 2023-01-13 NOTE — Consult Note (Signed)
ANTICOAGULATION CONSULT NOTE  Pharmacy Consult for Transition from heparin infusion to Eliquis Indication: pulmonary embolus  Patient Measurements: Height: 5\' 5"  (165.1 cm) Weight: 112.9 kg (249 lb) IBW/kg (Calculated) : 57 Heparin Dosing Weight: 83.8 kg  Labs: Recent Labs    01/11/23 0355 01/11/23 0458 01/11/23 1304 01/12/23 0340 01/13/23 0358  HGB 12.6  --   --  12.8 11.8*  HCT 38.7  --   --  38.0 34.9*  PLT 229  --   --  229 220  HEPARINUNFRC  --    < > 0.69 0.59 0.75*  CREATININE  --   --   --  0.72 0.58   < > = values in this interval not displayed.   Estimated Creatinine Clearance: 78.5 mL/min (by C-G formula based on SCr of 0.58 mg/dL).  Medical History: Past Medical History:  Diagnosis Date   Arthritis    Asthma    uses inhaler   Cataract    Hyperlipidemia    Hypertension    Keloid    Assessment: Jessica Brooks is a 72 y.o. female presenting with nausea, vomiting, and dizziness. Patient was not on anticoagulation prior to admission per chart review. CTA of chest shows bilateral pulmonary emboli. Pharmacy has been consulted to dose heparin.   Baseline Labs: Hgb 14.0, Hct 43.8, Plt 232   Goal of Therapy:  Heparin level 0.3-0.7 units/ml Monitor platelets by anticoagulation protocol: Yes    Plan:  STOP heparin infusion START Eliquis 10mg  po BID x 7 days, then 5mg  po BID   Bonny Egger Rodriguez-Guzman PharmD, BCPS 01/13/2023 9:57 AM

## 2023-01-13 NOTE — Progress Notes (Signed)
  Progress Note    01/13/2023 8:55 AM 1 Day Post-Op  Subjective:  Jessica Brooks is a 72 yo female now POD #1 from a Pulmonary Thrombectomy. She is also known to have a left lower peroneal DVT.  Patient endorses feeling better and breathing much better this morning.  No difficulties taking a deep breath.  Patient denies any pain from her left lower extremity.  Patient endorses eating and using the bathroom normally.  Patient remains on a heparin drip this morning.  Patient states she would like to ambulate around the halls somewhat today.  No complaints overnight.  Vitals all remained stable.   Vitals:   01/13/23 0304 01/13/23 0740  BP: 130/80 127/73  Pulse: 88 82  Resp: 20 16  Temp: 98.4 F (36.9 C) 98 F (36.7 C)  SpO2: 93% 98%   Physical Exam: Cardiac:  RRR, normal S1 and S2.  No murmurs noted. Lungs: Lungs clear on auscultation throughout.  No rales, rhonchi or wheezing noted. Incisions: Right groin incision is with dressing clean dry and intact. Extremities: All extremities with palpable pulses. Abdomen: Bowel sounds throughout, soft, nontender and nondistended. Neurologic: Alert and oriented x 4, follows all commands and answers all questions appropriately.  CBC    Component Value Date/Time   WBC 5.5 01/13/2023 0358   RBC 4.13 01/13/2023 0358   HGB 11.8 (L) 01/13/2023 0358   HGB 13.0 09/30/2022 0953   HCT 34.9 (L) 01/13/2023 0358   HCT 40.8 09/30/2022 0953   PLT 220 01/13/2023 0358   PLT 273 09/30/2022 0953   MCV 84.5 01/13/2023 0358   MCV 91 09/30/2022 0953   MCH 28.6 01/13/2023 0358   MCHC 33.8 01/13/2023 0358   RDW 14.6 01/13/2023 0358   RDW 13.6 09/30/2022 0953   LYMPHSABS 1.3 01/09/2023 1245   LYMPHSABS 1.6 09/30/2022 0953   MONOABS 0.4 01/09/2023 1245   EOSABS 0.0 01/09/2023 1245   EOSABS 0.0 09/30/2022 0953   BASOSABS 0.0 01/09/2023 1245   BASOSABS 0.0 09/30/2022 0953    BMET    Component Value Date/Time   NA 139 01/13/2023 0358   NA 140  09/30/2022 0953   K 3.7 01/13/2023 0358   CL 108 01/13/2023 0358   CO2 24 01/13/2023 0358   GLUCOSE 97 01/13/2023 0358   BUN 14 01/13/2023 0358   BUN 14 09/30/2022 0953   CREATININE 0.58 01/13/2023 0358   CALCIUM 8.5 (L) 01/13/2023 0358   GFRNONAA >60 01/13/2023 0358   GFRAA 102 08/07/2020 1114    INR No results found for: "INR"   Intake/Output Summary (Last 24 hours) at 01/13/2023 0855 Last data filed at 01/12/2023 1911 Gross per 24 hour  Intake 240 ml  Output 375 ml  Net -135 ml     Assessment/Plan:  72 y.o. female is s/p pulmonary thrombectomy with known left lower peroneal DVT.  1 Day Post-Op   PLAN: Discontinue heparin infusion today. Start patient on Eliquis 10 mg twice daily for 7 days then convert to 5 mg twice daily for the next year Per vascular surgery okay to discharge later today after started on oral anticoagulants. Follow-up with the vein and vascular in 1 month with left lower extremity ultrasound to follow-up and evaluate DVT.  DVT prophylaxis: Heparin infusion   Marcie [facility] 01/13/2023 8:55 AM

## 2023-01-13 NOTE — Progress Notes (Signed)
Transition of Care [facility]) - Inpatient Brief Assessment   Patient Details  Name: Jessica Brooks MRN: [id number] Date of Birth: [birth year]  [facility] of Care [facility]) CM/SW Contact:    Truddie Hidden, RN Phone Number: 01/13/2023, 12:41 PM   Clinical Narrative: Patient discharging home. No TOC needs assessed.    Transition of Care Asessment: Insurance and Status: Insurance coverage has been reviewed Patient has primary care physician: Yes Home environment has been reviewed: Return to home Prior level of function:: Independent Prior/Current Home Services: No current home services Social Determinants of Health Reivew: SDOH reviewed no interventions necessary Readmission risk has been reviewed: Yes Transition of care needs: no transition of care needs at this time

## 2023-01-13 NOTE — Progress Notes (Signed)
ANTICOAGULATION CONSULT NOTE  Pharmacy Consult for Heparin Infusion Indication: pulmonary embolus  Patient Measurements: Height: 5\' 5"  (165.1 cm) Weight: 112.9 kg (249 lb) IBW/kg (Calculated) : 57 Heparin Dosing Weight: 83.8 kg  Labs: Recent Labs    01/11/23 0355 01/11/23 0458 01/11/23 1304 01/12/23 0340 01/13/23 0358  HGB 12.6  --   --  12.8 11.8*  HCT 38.7  --   --  38.0 34.9*  PLT 229  --   --  229 220  HEPARINUNFRC  --    < > 0.69 0.59 0.75*  CREATININE  --   --   --  0.72 0.58   < > = values in this interval not displayed.   Estimated Creatinine Clearance: 78.5 mL/min (by C-G formula based on SCr of 0.58 mg/dL).  Medical History: Past Medical History:  Diagnosis Date   Arthritis    Asthma    uses inhaler   Cataract    Hyperlipidemia    Hypertension    Keloid    Assessment: Jessica Brooks is a 72 y.o. female presenting with nausea, vomiting, and dizziness. Patient was not on anticoagulation prior to admission per chart review. CTA of chest shows bilateral pulmonary emboli. Pharmacy has been consulted to dose heparin.   Baseline Labs: Hgb 14.0, Hct 43.8, Plt 232   Goal of Therapy:  Heparin level 0.3-0.7 units/ml Monitor platelets by anticoagulation protocol: Yes    Date Time HL Rate/Comment  7/27 0334 0.40 1400/therapeutic x1 7/27 1149 0.80 1400/supratherapeutic 7/27 2030 0.91 1350/supratherapeutic 7/28 0458 0.69 Therapeutic x 1 7/28 1304 0.69 Therapeutic x 2 7/29 0340 0.59 Therapeutic x 3 7/30     0358    0.75     SUPRAtherapeutic   Plan:  7/30:  HL @ 0358 = 0.75, SUPRAtherapeutic  - Will decrease heparin drip to 1050 units/hr and recheck HL 8 hrs after rate change Thank you for involving pharmacy in this patient's care.   Kemberly Taves D 01/13/2023 5:20 AM  5:20 AM

## 2023-01-13 NOTE — Plan of Care (Signed)
  Problem: Education: Goal: Knowledge of General Education information will improve Description: Including pain rating scale, medication(s)/side effects and non-pharmacologic comfort measures Outcome: Progressing   Problem: Clinical Measurements: Goal: Ability to maintain clinical measurements within normal limits will improve Outcome: Progressing   Problem: Clinical Measurements: Goal: Respiratory complications will improve Outcome: Progressing   Problem: Clinical Measurements: Goal: Cardiovascular complication will be avoided Outcome: Progressing   Problem: Activity: Goal: Risk for activity intolerance will decrease Outcome: Progressing   Problem: Pain Managment: Goal: General experience of comfort will improve Outcome: Progressing

## 2023-01-13 NOTE — Progress Notes (Signed)
Jessica Brooks  A and O x 4. VSS. Pt tolerating diet well. No complaints of pain or nausea. IV removed intact, prescriptions given. Pt voiced understanding of discharge instructions with no further questions. Pt discharged.     Allergies as of 01/13/2023       Reactions   Hydrochlorothiazide Palpitations   Jittery        Medication List     STOP taking these medications    meloxicam 15 MG tablet Commonly known as: MOBIC   mupirocin ointment 2 % Commonly known as: BACTROBAN       TAKE these medications    albuterol 108 (90 Base) MCG/ACT inhaler Commonly known as: VENTOLIN HFA Inhale 2 puffs into the lungs every 6 (six) hours as needed for wheezing or shortness of breath.   amLODipine 5 MG tablet Commonly known as: NORVASC Take 1 tablet (5 mg total) by mouth daily.   Apixaban Starter Pack (10mg  and 5mg ) Commonly known as: ELIQUIS STARTER PACK Take as directed on package: start with two-5mg  tablets twice daily for 7 days. On day 8, switch to one-5mg  tablet twice daily.   apixaban 5 MG Tabs tablet Commonly known as: ELIQUIS Take 1 tablet (5 mg total) by mouth 2 (two) times daily. Start after finishing starter pack   Fe Fum-Vit C-Vit B12-FA Caps capsule Commonly known as: TRIGELS-F FORTE Take 1 capsule by mouth daily after breakfast. Start taking on: January 14, 2023   levothyroxine 100 MCG tablet Commonly known as: SYNTHROID Take 100 mcg by mouth daily before breakfast.   lisinopril 5 MG tablet Commonly known as: ZESTRIL Take 1 tablet (5 mg total) by mouth daily.   metoprolol succinate 50 MG 24 hr tablet Commonly known as: TOPROL-XL Take 1 tablet (50 mg total) by mouth daily. Take with or immediately following a meal.   montelukast 10 MG tablet Commonly known as: SINGULAIR Take 1 tablet (10 mg total) by mouth at bedtime.   rosuvastatin 10 MG tablet Commonly known as: CRESTOR Take 1 tablet (10 mg total) by mouth daily.   sertraline 25 MG  tablet Commonly known as: ZOLOFT Take 1 tablet (25 mg total) by mouth daily.   VITAMIN D PO Take 1 capsule by mouth daily.        Vitals:   01/13/23 0304 01/13/23 0740  BP: 130/80 127/73  Pulse: 88 82  Resp: 20 16  Temp: 98.4 F (36.9 C) 98 F (36.7 C)  SpO2: 93% 98%    Suzzanne Cloud

## 2023-01-13 NOTE — Discharge Summary (Signed)
Physician Discharge Summary   Patient: Jessica Brooks MRN: [id number] DOB: 1950-07-29  Admit date:     01/09/2023  Discharge date: 01/13/23  Discharge Physician: Arnetha Courser   PCP: [facility]   Recommendations at discharge:  Please obtain CBC and BMP in 1 week Patient is being discharged on Eliquis, likely will need 3 months of anticoagulation, please determine further need. 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  Vitals in the emergency department showed temperature of 98.8, respiration rate of 16, heart rate of 101, blood pressure 159/91, SpO2 of 95% on room air.  Serum sodium is 137, potassium 3.3, chloride 102, bicarb 23, BUN of 9, serum creatinine is 2.63, EGFR greater than 60, nonfasting blood glucose 116, WBC 5.2, hemoglobin 14, platelets of 232.  Lactic acid was 1.1.  UA was negative for leukocytes and nitrates.  CTA head and neck w and w/o CM: No acute intracranial pathology. 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 Possible bilateral pulmonary emboli. Recommend dedicated CTA chest for further evaluation   ED treatment: Ondansetron 4 mg IV one-time dose, sodium  chloride 1 L bolus.  7/27: Vital stable, on room air, CTA chest with large volume PE bilaterally with a concern of right heart strain. Also noted to have a 9.6 cm cystic lesion below the spleen, likely left kidney but it was not visualized properly.  Will get benefit from renal ultrasound or CT as nonemergent basis for further evaluation. Lower extremity venous Doppler positive for left peroneal thrombus. Vascular surgery was consulted and patient was started on heparin infusion. Vascular surgery planning thrombectomy on Monday.  7/28: Hemodynamically stable, thrombectomy likely tomorrow. 7/29: Remained stable.  Awaiting thrombectomy later today. 7/30: Patient remained hemodynamically stable.  S/p bilateral mechanical thrombectomy with vascular surgery yesterday.  They were able to remove extensive clots.  Patient tolerated the procedure well.  Some decrease in hemoglobin to 11.8 after the procedure due to some blood loss during surgery.  Patient was started on some supplement.  Her heparin is being switched with Eliquis.  Patient is being given 2 prescriptions 1 for starter pack and second for Eliquis 5 mg twice daily.  Patient need to finish starter pack first before starting 5 mg twice daily.  She will need at least 3 months of anticoagulation and need to have a close follow-up with PCP.  They can determine the further need.  Her home meloxicam was discontinued to decrease the risk of bleeding with anticoagulation.  Patient will continue rest of her home medications, along with addition of Eliquis and need to have a close follow-up with her providers for further recommendations.  Assessment and Plan: * Bilateral pulmonary embolism (HCC) Status post CT a of the chest for PE, per radiology with CT evidence of right heart strain.  Echocardiogram with  normal biventricular function.  Lower extremity venous Doppler positive for left peroneus thrombus. Patient is little sedentary, no recent travel or  history of malignancy.  Colonoscopy done last year was normal except some internal hemorrhoids. S/p mechanical thrombectomy by vascular surgery on 01/12/2023 -Switched heparin with Eliquis -Continue to monitor  Hypokalemia Resolved -Replete potassium as needed and monitor  Hypertension Home antihypertensive medications include amlodipine 5 mg daily, lisinopril 5 mg daily Hydralazine 5 mg IV every 6 hours as needed for SBP greater 175, 5 days with  Asthma Does not appear to be in acute exacerbation Albuterol nebulizer every 6 hours as needed for shortness of breath and wheezing, 5 days ordered  Postablative hypothyroidism Levothyroxine 100 mcg daily before breakfast  Mixed hyperlipidemia Rosuvastatin 10 mg daily  Situational anxiety Ativan 0.5 mg IV every 6 hours as needed for anxiety, 1 day ordered  Obesity, Class III, BMI 40-49.9 (morbid obesity) (HCC) This complicates overall care and prognosis.    Consultants: Vascular surgery Procedures performed: Mechanical thrombectomy for bilateral PE Disposition: Home Diet recommendation:  Discharge Diet Orders (From admission, onward)     Start     Ordered   01/13/23 0000  Diet - low sodium heart healthy        01/13/23 1118           Cardiac diet DISCHARGE MEDICATION: Allergies as of 01/13/2023       Reactions   Hydrochlorothiazide Palpitations   Jittery        Medication List     STOP taking these medications    meloxicam 15 MG tablet Commonly known as: MOBIC   mupirocin ointment 2 % Commonly known as: BACTROBAN       TAKE these medications    albuterol 108 (90 Base) MCG/ACT inhaler Commonly known as: VENTOLIN HFA Inhale 2 puffs into the lungs every 6 (six) hours as needed for wheezing or shortness of breath.   amLODipine 5 MG tablet Commonly known as: NORVASC Take 1 tablet (5 mg total) by mouth daily.   Apixaban Starter Pack (10mg  and 5mg ) Commonly known as: ELIQUIS STARTER PACK Take as  directed on package: start with two-5mg  tablets twice daily for 7 days. On day 8, switch to one-5mg  tablet twice daily.   apixaban 5 MG Tabs tablet Commonly known as: ELIQUIS Take 1 tablet (5 mg total) by mouth 2 (two) times daily. Start after finishing starter pack   Fe Fum-Vit C-Vit B12-FA Caps capsule Commonly known as: TRIGELS-F FORTE Take 1 capsule by mouth daily after breakfast. Start taking on: January 14, 2023   levothyroxine 100 MCG tablet Commonly known as: SYNTHROID Take 100 mcg by mouth daily before breakfast.   lisinopril 5 MG tablet Commonly known as: ZESTRIL Take 1 tablet (5 mg total) by mouth daily.   metoprolol succinate 50 MG 24 hr tablet Commonly known as: TOPROL-XL Take 1 tablet (50 mg total) by mouth daily. Take with or immediately following a meal.   montelukast 10 MG tablet Commonly known as: SINGULAIR Take 1 tablet (10 mg total) by mouth at bedtime.   rosuvastatin 10 MG tablet Commonly known as: CRESTOR Take 1 tablet (10 mg total) by mouth daily.   sertraline 25 MG tablet Commonly known as: ZOLOFT Take 1 tablet (25 mg total) by mouth daily.   VITAMIN D PO Take 1 capsule by mouth daily.        Follow-up Information     Georgiana Spinner, NP Follow up in 1 month(s).  Specialty: Vascular Surgery Why: Post op Check from Pulmonary Embolism and Left Lower extremity U/S follow up for DVT Contact information: [address] [place] [address] [phone]         [facility]. Schedule an appointment as soon as possible for a visit in 1 week(s).   Contact information: [address] [place] [address] [phone]                Discharge Exam: Ceasar Mons Weights   01/09/23 1227  Weight: 112.9 kg   General.  Morbidly obese lady, in no acute distress. Pulmonary.  Lungs clear bilaterally, normal respiratory effort. CV.  Regular rate and rhythm, no JVD, rub or murmur. Abdomen.  Soft, nontender,  nondistended, BS positive. CNS.  Alert and oriented .  No focal neurologic deficit. Extremities.  No edema, no cyanosis, pulses intact and symmetrical. Psychiatry.  Judgment and insight appears normal.   Condition at discharge: stable  The results of significant diagnostics from this hospitalization (including imaging, microbiology, ancillary and laboratory) are listed below for reference.   Imaging Studies: PERIPHERAL VASCULAR CATHETERIZATION  Result Date: 01/12/2023 See surgical note for result.  ECHOCARDIOGRAM COMPLETE  Result Date: 01/10/2023    ECHOCARDIOGRAM REPORT   Patient Name:   ANIAS CUTCHIN Port Date of Exam: 01/10/2023 Medical Rec #:  [id number]            Height:       65.0 in Accession #:    [id number]           Weight:       249.0 lb Date of Birth:  02-Dec-1950           BSA:          2.171 m Patient Age:    71 years             BP:           147/76 mmHg Patient Gender: F                    HR:           97 bpm. Exam Location:  [facility] Procedure: 2D Echo and Strain Analysis Indications:     Pulmonary Embolus I26.09  History:         Patient has no prior history of Echocardiogram examinations.  Sonographer:     Overton Mam RDCS, FASE Referring Phys:  [id number] AMY N COX Diagnosing Phys: Jodelle Red MD  Sonographer Comments: No subcostal window and patient is obese. Global longitudinal strain was attempted. IMPRESSIONS  1. Left ventricular ejection fraction, by estimation, is 55 to 60%. The left ventricle has normal function. The left ventricle has no regional wall motion abnormalities. Left ventricular diastolic parameters were normal.  2. Right ventricular systolic function is normal. The right ventricular size is normal.  3. The mitral valve is normal in structure. Trivial mitral valve regurgitation. No evidence of mitral stenosis.  4. The aortic valve is tricuspid. Aortic valve regurgitation is mild. No aortic stenosis is present. Comparison(s): No prior Echocardiogram.  Conclusion(s)/Recommendation(s): Normal biventricular function without evidence of hemodynamically significant valvular heart disease. FINDINGS  Left Ventricle: Left ventricular ejection fraction, by estimation, is 55 to 60%. The left ventricle has normal function. The left ventricle has no regional wall motion abnormalities. The global longitudinal strain is normal despite suboptimal segment tracking. The left ventricular internal cavity size was normal in size. There is no left ventricular hypertrophy. Left  ventricular diastolic parameters were normal. Right Ventricle: The right ventricular size is normal. No increase in right ventricular wall thickness. Right ventricular systolic function is normal. Left Atrium: Left atrial size was normal in size. Right Atrium: Right atrial size was normal in size. Pericardium: There is no evidence of pericardial effusion. Mitral Valve: The mitral valve is normal in structure. Trivial mitral valve regurgitation. No evidence of mitral valve stenosis. Tricuspid Valve: The tricuspid valve is normal in structure. Tricuspid valve regurgitation is trivial. No evidence of tricuspid stenosis. Aortic Valve: The aortic valve is tricuspid. Aortic valve regurgitation is mild. Aortic regurgitation PHT measures 551 msec. No aortic stenosis is present. Aortic valve peak gradient measures 8.0 mmHg. Pulmonic Valve: The pulmonic valve was not well visualized. Pulmonic valve regurgitation is not visualized. No evidence of pulmonic stenosis. Aorta: The aortic root, ascending aorta and aortic arch are all structurally normal, with no evidence of dilitation or obstruction. Venous: The inferior vena cava was not well visualized. IAS/Shunts: The atrial septum is grossly normal.  LEFT VENTRICLE PLAX 2D LVIDd:         2.80 cm   Diastology LVIDs:         2.00 cm   LV e' medial:    5.98 cm/s LV PW:         1.10 cm   LV E/e' medial:  7.8 LV IVS:        1.10 cm   LV e' lateral:   12.80 cm/s LVOT diam:      2.10 cm   LV E/e' lateral: 3.6 LV SV:         53 LV SV Index:   24        2D Longitudinal Strain LVOT Area:     3.46 cm  2D Strain GLS (A2C):   -14.7 %  RIGHT VENTRICLE RV Basal diam:  3.40 cm TAPSE (M-mode): 1.8 cm LEFT ATRIUM             Index        RIGHT ATRIUM           Index LA diam:        2.20 cm 1.01 cm/m   RA Area:     13.10 cm LA Vol (A2C):   17.1 ml 7.88 ml/m   RA Volume:   30.30 ml  13.96 ml/m LA Vol (A4C):   33.9 ml 15.61 ml/m LA Biplane Vol: 26.0 ml 11.97 ml/m  AORTIC VALVE                 PULMONIC VALVE AV Area (Vmax): 2.33 cm     PV Vmax:        1.00 m/s AV Vmax:        141.00 cm/s  PV Peak grad:   4.0 mmHg AV Peak Grad:   8.0 mmHg     RVOT Peak grad: 3 mmHg LVOT Vmax:      95.00 cm/s LVOT Vmean:     52.600 cm/s LVOT VTI:       0.153 m AI PHT:         551 msec  AORTA Ao Root diam: 3.80 cm MITRAL VALVE MV Area (PHT): 3.51 cm    SHUNTS MV Decel Time: 216 msec    Systemic VTI:  0.15 m MV E velocity: 46.60 cm/s  Systemic Diam: 2.10 cm MV A velocity: 91.20 cm/s MV E/A ratio:  0.51 Jodelle Red MD Electronically signed by Jodelle Red MD Signature Date/Time: 01/10/2023/1:13:12 PM  Final    US Venous Img Lower Bilateral (DVT)  Result Date: 01/09/2023 CLINICAL DATA:  Bilateral pulmonary emboli EXAM: BILATERAL LOWER EXTREMITY VENOUS DOPPLER ULTRASOUND TECHNIQUE: Gray-scale sonography with graded compression, as well as color Doppler and duplex ultrasound were performed to evaluate the lower extremity deep venous systems from the level of the common femoral vein and including the common femoral, femoral, profunda femoral, popliteal and calf veins including the posterior tibial, peroneal and gastrocnemius veins when visible. The superficial great saphenous vein was also interrogated. Spectral Doppler was utilized to evaluate flow at rest and with distal augmentation maneuvers in the common femoral, femoral and popliteal veins. COMPARISON:  None Available. FINDINGS: RIGHT LOWER  EXTREMITY Common Femoral Vein: No evidence of thrombus. Normal compressibility, respiratory phasicity and response to augmentation. Saphenofemoral Junction: No evidence of thrombus. Normal compressibility and flow on color Doppler imaging. Profunda Femoral Vein: No evidence of thrombus. Normal compressibility and flow on color Doppler imaging. Femoral Vein: No evidence of thrombus. Normal compressibility, respiratory phasicity and response to augmentation. Popliteal Vein: No evidence of thrombus. Normal compressibility, respiratory phasicity and response to augmentation. Calf Veins: No evidence of thrombus. Normal compressibility and flow on color Doppler imaging. Superficial Great Saphenous Vein: No evidence of thrombus. Normal compressibility. Venous Reflux:  None. Other Findings:  None. LEFT LOWER EXTREMITY Common Femoral Vein: No evidence of thrombus. Normal compressibility, respiratory phasicity and response to augmentation. Saphenofemoral Junction: No evidence of thrombus. Normal compressibility and flow on color Doppler imaging. Profunda Femoral Vein: No evidence of thrombus. Normal compressibility and flow on color Doppler imaging. Femoral Vein: No evidence of thrombus. Normal compressibility, respiratory phasicity and response to augmentation. Popliteal Vein: No evidence of thrombus. Normal compressibility, respiratory phasicity and response to augmentation. Calf Veins: Thrombus is noted in the peroneal vein with decreased compressibility. Superficial Great Saphenous Vein: No evidence of thrombus. Normal compressibility. Venous Reflux:  None. Other Findings:  None. IMPRESSION: Left peroneal thrombus. Remainder of the lower extremity veins appear within normal limits. Electronically Signed   By: Alcide Clever M.D.   On: 01/09/2023 18:09   CT Angio Chest Pulmonary Embolism (PE) W or WO Contrast  Result Date: 01/09/2023 CLINICAL DATA:  Pulmonary embolism suspected, high probability. Abnormal CTA of the head  and neck. EXAM: CT ANGIOGRAPHY CHEST WITH CONTRAST TECHNIQUE: Multidetector CT imaging of the chest was performed using the standard protocol during bolus administration of intravenous contrast. Multiplanar CT image reconstructions and MIPs were obtained to evaluate the vascular anatomy. RADIATION DOSE REDUCTION: This exam was performed according to the departmental dose-optimization program which includes automated exposure control, adjustment of the mA and/or kV according to patient size and/or use of iterative reconstruction technique. CONTRAST:  75mL OMNIPAQUE IOHEXOL 350 MG/ML SOLN COMPARISON:  None Available. FINDINGS: Cardiovascular: The heart size is normal. Right ventricular measurement is 4.6 cm. Maximal left ventricular measurement is 4.4 cm. Minimal calcifications are present in the distal aortic arch. Great vessel origins are within limits. Pulmonary artery opacification is excellent. Extensive filling defects are present in the right upper lobe are pulmonary arteries and branch vessels. Occlusive embolus is present in the anterior right middle lobar artery. Extensive emboli are present in the inter lobar artery and extending into segmental branches in the right lower lobe. Embolus is present in the superior segment of the right lower lobe. Scattered segmental emboli are present in the left. Mediastinum/Nodes: No enlarged mediastinal, hilar, or axillary lymph nodes. Thyroid gland, trachea, and esophagus demonstrate no significant findings. Lungs/Pleura: Mild dependent  atelectasis is present. No focal airspace disease is present. The airways are patent. Upper Abdomen: Multiple hepatic cysts are present. The largest is in the left lobe measuring 3.7 cm. A cystic lesion below the spleen measures up to 9.6 cm, likely related to the left kidney. No other focal lesions are present. No significant adenopathy is present. Musculoskeletal: The vertebral body heights are normal. Thoracic kyphosis is within normal  limits. Fused anterior osteophytes are present throughout the thoracic spine. Ribs are unremarkable. Review of the MIP images confirms the above findings. IMPRESSION: 1. Large volume of pulmonary emboli in the lungs bilaterally, with dilatation of the right atrium and right ventricle (RV to LV ratio of 1.05) indicative of elevated right-sided heart pressures and right heart strain. These findings have been shown to be associated with a increased morbidity and mortality in the setting pulmonary embolism. 2. Multiple hepatic cysts. 3. 9.6 cm cystic lesion below the spleen, likely related to the left kidney. Lesion is incompletely imaged. Renal ultrasound or CT of the abdomen could be used for further evaluation. Critical Value/emergent results were called by telephone at the time of interpretation on 01/09/2023 at 4:57 pm to provider Dr. Derrill Kay, Who verbally acknowledged these results. Electronically Signed   By: Marin Roberts M.D.   On: 01/09/2023 17:00   CT Angio Head Neck W WO CM  Result Date: 01/09/2023 CLINICAL DATA:  Nausea and vomiting. EXAM: CT ANGIOGRAPHY HEAD AND NECK WITH AND WITHOUT CONTRAST TECHNIQUE: Multidetector CT imaging of the head and neck was performed using the standard protocol during bolus administration of intravenous contrast. Multiplanar CT image reconstructions and MIPs were obtained to evaluate the vascular anatomy. Carotid stenosis measurements (when applicable) are obtained utilizing NASCET criteria, using the distal internal carotid diameter as the denominator. RADIATION DOSE REDUCTION: This exam was performed according to the departmental dose-optimization program which includes automated exposure control, adjustment of the mA and/or kV according to patient size and/or use of iterative reconstruction technique. CONTRAST:  75mL OMNIPAQUE IOHEXOL 350 MG/ML SOLN COMPARISON:  Brain MRI 11/21/2020 FINDINGS: CT HEAD FINDINGS Brain: There is no acute intracranial hemorrhage,  extra-axial fluid collection, or acute infarct. Parenchymal volume is normal. The ventricles are normal in size. Gray-white differentiation is preserved. 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 The pituitary and suprasellar region are normal. There is no mass lesion. There is no mass effect or midline shift. Vascular: See below. Skull: Normal. Negative for fracture or focal lesion. Sinuses/Orbits: The paranasal sinuses are clear. A left lens implant is noted. The globes and orbits are otherwise unremarkable. Other: The mastoid air cells and middle ear cavities are clear. A soft tissue nodule in the left cheek is unchanged since 2022, likely a sebaceous cyst or similar. Review of the MIP images confirms the above findings CTA NECK FINDINGS Aortic arch: There is a minimal calcified plaque in the aortic arch. The origins of the major branch vessels are patent. The subclavian arteries are patent to the level imaged. Right carotid system: The right common, internal, and external carotid arteries are patent, without significant stenosis or occlusion. There is mild fusiform dilation of the high cervical right internal carotid artery measuring up 2 5 mm in diameter. There is no saccular aneurysm/pseudoaneurysm or dissection flap. Left carotid system: The left common, internal, and external carotid arteries are patent, without significant stenosis or occlusion. There is fusiform dilation of the high cervical left internal carotid artery measuring up to 7 mm proximal to the  skull base. There is no saccular aneurysm/pseudoaneurysm or dissection flap. Vertebral arteries: The vertebral arteries are patent, without hemodynamically significant stenosis or occlusion. There is no evidence of dissection or aneurysm. Skeleton: There is no acute osseous abnormality or suspicious osseous lesion. There is no visible canal hematoma. Other neck: The soft tissues of the neck are unremarkable. Upper chest: The  imaged lung apices are clear. There are possible pulmonary emboli at the lobar level on the right and the segmental level on the left, predominantly nonocclusive. Review of the MIP images confirms the above findings CTA HEAD FINDINGS Anterior circulation: There is calcified plaque in the intracranial ICAs resulting in up to mild stenosis on the left and no significant stenosis on the right. The bilateral MCAs and ACAS are patent, without proximal stenosis or occlusion. The anterior communicating artery is not definitely seen. There is no aneurysm or AVM. Posterior circulation: The bilateral V4 segments are patent. The basilar artery is patent. The major cerebellar arteries appear patent. The bilateral PCAs are patent, without proximal stenosis or occlusion. Posterior communicating arteries are not identified. There is no aneurysm or AVM. Venous sinuses: Patent. Anatomic variants: None. Review of the MIP images confirms the above findings IMPRESSION: 1. No acute intracranial pathology. 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 2. Patent vasculature of the head and neck with no significant stenosis or occlusion. 3. 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 4. Possible bilateral pulmonary emboli. Recommend dedicated CTA chest for further evaluation. Electronically Signed   By: Lesia Hausen M.D.   On: 01/09/2023 14:25   DG Chest 2 View  Result Date: 01/09/2023 CLINICAL DATA:  cough EXAM: CHEST - 2 VIEW COMPARISON:  11/16/2020. FINDINGS: Bilateral lung fields are clear. Redemonstration of elevated right hemidiaphragm. Bilateral costophrenic angles are clear. No acute osseous abnormalities. The soft tissues are within normal limits. IMPRESSION: No active cardiopulmonary disease. Electronically Signed   By: Jules Schick M.D.   On: 01/09/2023 14:23    Microbiology: Results for orders placed  or performed during the hospital encounter of 01/09/23  SARS Coronavirus 2 by RT PCR (hospital order, performed in [facility] hospital lab) *cepheid single result test* Anterior Nasal Swab     Status: None   Collection Time: 01/09/23 12:32 PM   Specimen: Anterior Nasal Swab  Result Value Ref Range Status   SARS Coronavirus 2 by RT PCR NEGATIVE NEGATIVE Final    Comment: (NOTE) SARS-CoV-2 target nucleic acids are NOT DETECTED.  The SARS-CoV-2 RNA is generally detectable in upper and lower respiratory specimens during the acute phase of infection. The lowest concentration of SARS-CoV-2 viral copies this assay can detect is 250 copies / mL. A negative result does not preclude SARS-CoV-2 infection and should not be used as the sole basis for treatment or other patient management decisions.  A negative result may occur with improper specimen collection / handling, submission of specimen other than nasopharyngeal swab, presence of viral mutation(s) within the areas targeted by this assay, and inadequate number of viral copies (<250 copies / mL). A negative result must be combined with clinical observations, patient history, and epidemiological information.  Fact Sheet for Patients:   RoadLapTop.co.za  Fact Sheet for Healthcare Providers: http://kim-miller.com/  This test is not yet approved or  cleared by the [place] FDA and has been authorized for detection and/or diagnosis of SARS-CoV-2 by FDA under an Emergency Use Authorization (EUA).  This EUA  will remain in effect (meaning this test can be used) for the duration of the COVID-19 declaration under Section 564(b)(1) of the Act, 21 U.S.C. section 360bbb-3(b)(1), unless the authorization is terminated or revoked sooner.  Performed at [facility], [address] [place], [place] [address]     Labs: CBC: Recent Labs  Lab 01/09/23 1245 01/10/23 0334 01/11/23 0355  01/12/23 0340 01/13/23 0358  WBC 5.2 5.3 4.6 5.0 5.5  NEUTROABS 3.5  --   --   --   --   HGB 14.0 13.4 12.6 12.8 11.8*  HCT 43.8 41.2 38.7 38.0 34.9*  MCV 87.4 87.1 86.2 84.3 84.5  PLT 232 208 229 229 220   Basic Metabolic Panel: Recent Labs  Lab 01/09/23 1242 01/09/23 1245 01/10/23 0334 01/12/23 0340 01/13/23 0358  NA  --  137 137 138 139  K  --  3.3* 3.0* 3.8 3.7  CL  --  102 106 106 108  CO2  --  23 21* 24 24  GLUCOSE  --  116* 95 93 97  BUN  --  9 8 16 14   CREATININE  --  0.63 0.60 0.72 0.58  CALCIUM  --  9.0 8.5* 8.7* 8.5*  MG 2.1  --   --   --   --    Liver Function Tests: Recent Labs  Lab 01/09/23 1245  AST 16  ALT 18  ALKPHOS 56  BILITOT 0.6  PROT 7.7  ALBUMIN 4.0   CBG: Recent Labs  Lab 01/09/23 1226  GLUCAP 92    Discharge time spent: greater than 30 minutes.  This record has been created using Conservation officer, historic buildings. Errors have been sought and corrected,but may not always be located. Such creation errors do not reflect on the standard of care.   Signed: Arnetha Courser, MD [facility] 01/13/2023

## 2023-01-13 NOTE — TOC Benefit Eligibility Note (Signed)
Pharmacy Patient Advocate Encounter  Insurance verification completed.    The patient is insured through [employer] test claim for Eliquis. Currently a quantity of 60 is a 30 day supply and the co-pay is $0.00 .   This test claim was processed through [facility]- copay amounts may vary at other pharmacies due to pharmacy/plan contracts, or as the patient moves through the different stages of their insurance plan.

## 2023-02-18 ENCOUNTER — Other Ambulatory Visit (INDEPENDENT_AMBULATORY_CARE_PROVIDER_SITE_OTHER): Payer: Self-pay | Admitting: Vascular Surgery

## 2023-02-18 DIAGNOSIS — I82452 Acute embolism and thrombosis of left peroneal vein: Secondary | ICD-10-CM

## 2023-02-19 ENCOUNTER — Ambulatory Visit (INDEPENDENT_AMBULATORY_CARE_PROVIDER_SITE_OTHER): Payer: Medicare HMO

## 2023-02-19 ENCOUNTER — Ambulatory Visit (INDEPENDENT_AMBULATORY_CARE_PROVIDER_SITE_OTHER): Payer: Medicare HMO | Admitting: Nurse Practitioner

## 2023-02-19 ENCOUNTER — Encounter (INDEPENDENT_AMBULATORY_CARE_PROVIDER_SITE_OTHER): Payer: Self-pay | Admitting: Nurse Practitioner

## 2023-02-19 VITALS — BP 137/85 | HR 90 | Resp 18 | Ht 65.0 in | Wt 243.2 lb

## 2023-02-19 DIAGNOSIS — I82452 Acute embolism and thrombosis of left peroneal vein: Secondary | ICD-10-CM

## 2023-02-19 DIAGNOSIS — E782 Mixed hyperlipidemia: Secondary | ICD-10-CM | POA: Diagnosis not present

## 2023-02-19 DIAGNOSIS — I2699 Other pulmonary embolism without acute cor pulmonale: Secondary | ICD-10-CM

## 2023-02-19 DIAGNOSIS — I10 Essential (primary) hypertension: Secondary | ICD-10-CM

## 2023-02-19 MED ORDER — APIXABAN 5 MG PO TABS: 5.0000 mg | ORAL_TABLET | Freq: Two times a day (BID) | ORAL | 3 refills | Status: AC

## 2023-02-20 NOTE — Progress Notes (Signed)
Subjective:    Patient ID: Jessica Brooks, female    DOB: [birth year], 72 y.o.   MRN: [id number] Chief Complaint  Patient presents with   Follow-up    Follow up with Georgiana Spinner, NP (Vascular Surgery) in 1 month (02/11/2023); Post op Check from Pulmonary Embolism and Left Lower extremity U/S follow up for DVT    The patient is a 72 year old female who returns today for evaluation following pulmonary thrombectomy on 01/12/2023.  The patient notes that she never had any chest pain or shortness of breath but she initially presented to the emergency room due to just feeling unwell with dizziness and some nausea and vomiting.  In the midst of workup she was found to have a DVT in her left peroneal vein but also bilateral pulmonary embolisms.  Since the patient's discharge has not had any significant issues with leg swelling or chest pain or shortness of breath.  She has been diligently taking her Eliquis.  She denies any issues with bleeding.  Overall she has progressed very well.  Today DVT study shows no evidence of peroneal thrombus.  There is a Baker's cyst noted in the left popliteal space.    Review of Systems  Cardiovascular:  Negative for leg swelling.  Musculoskeletal:  Positive for arthralgias.  All other systems reviewed and are negative.      Objective:   Physical Exam Vitals reviewed.  HENT:     Head: Normocephalic.  Cardiovascular:     Rate and Rhythm: Normal rate.  Pulmonary:     Effort: Pulmonary effort is normal.  Musculoskeletal:     Left lower leg: No edema.  Skin:    General: Skin is warm and dry.  Neurological:     Mental Status: She is alert and oriented to person, place, and time.  Psychiatric:        Mood and Affect: Mood normal.        Behavior: Behavior normal.        Thought Content: Thought content normal.        Judgment: Judgment normal.     BP 137/85 (BP Location: Left Wrist)   Pulse 90   Resp 18   Ht 5\' 5"  (1.651 m)   Wt 243 lb 3.2  oz (110.3 kg)   BMI 40.47 kg/m   Past Medical History:  Diagnosis Date   Arthritis    Asthma    uses inhaler   Cataract    Hyperlipidemia    Hypertension    Keloid     Social History   Socioeconomic History   Marital status: Divorced    Spouse name: Not on file   Number of children: Not on file   Years of education: Not on file   Highest education level: Not on file  Occupational History   Not on file  Tobacco Use   Smoking status: Former    Current packs/day: 0.00    Types: Cigarettes    Quit date: 06/16/1978    Years since quitting: 44.7   Smokeless tobacco: Never  Vaping Use   Vaping status: Never Used  Substance and Sexual Activity   Alcohol use: No   Drug use: No   Sexual activity: Not Currently  Other Topics Concern   Not on file  Social History Narrative   Not on file   Social Determinants of Health   Financial Resource Strain: Low Risk  (03/04/2022)   Overall Financial Resource Strain (CARDIA)    Difficulty  of Paying Living Expenses: Not hard at all  Food Insecurity: No Food Insecurity (01/09/2023)   Hunger Vital Sign    Worried About Running Out of Food in the Last Year: Never true    Ran Out of Food in the Last Year: Never true  Transportation Needs: No Transportation Needs (01/09/2023)   PRAPARE - Administrator, Civil Service (Medical): No    Lack of Transportation (Non-Medical): No  Physical Activity: Insufficiently Active (03/04/2022)   Exercise Vital Sign    Days of Exercise per Week: 3 days    Minutes of Exercise per Session: 30 min  Stress: No Stress Concern Present (03/04/2022)   [employer] of Occupational Health - Occupational Stress Questionnaire    Feeling of Stress : Only a little  Social Connections: Moderately Integrated (03/04/2022)   Social Connection and Isolation Panel [NHANES]    Frequency of Communication with Friends and Family: More than three times a week    Frequency of Social Gatherings with Friends and  Family: Once a week    Attends Religious Services: More than 4 times per year    Active Member of [employer] or Organizations: Yes    Attends Engineer, structural: More than 4 times per year    Marital Status: Divorced  Intimate Partner Violence: Not At Risk (01/09/2023)   Humiliation, Afraid, Rape, and Kick questionnaire    Fear of Current or Ex-Partner: No    Emotionally Abused: No    Physically Abused: No    Sexually Abused: No    Past Surgical History:  Procedure Laterality Date   ABDOMINAL HYSTERECTOMY     BREAST BIOPSY Right    needle bx years ago unsure of date   BREAST BIOPSY Right    another bx done by byrnette    CATARACT EXTRACTION W/PHACO Left 06/11/2017   Procedure: CATARACT EXTRACTION PHACO AND INTRAOCULAR LENS PLACEMENT (IOC)-LEFT;  Surgeon: Lockie Mola, MD;  Location: [facility] ORS;  Service: Ophthalmology;  Laterality: Left;  LOT # [id number] H [place] :34.4 AP 15.3% CDE 5.29   COLONOSCOPY WITH PROPOFOL N/A 10/17/2021   Procedure: COLONOSCOPY WITH PROPOFOL;  Surgeon: Wyline Mood, MD;  Location: [facility] ENDOSCOPY;  Service: Gastroenterology;  Laterality: N/A;   KELOID EXCISION N/A    KELOID REMOVAL TO NAVAL IN LATE 1980's   MULTIPLE TOOTH EXTRACTIONS     PULMONARY THROMBECTOMY Bilateral 01/12/2023   Procedure: PULMONARY THROMBECTOMY;  Surgeon: Annice Needy, MD;  Location: [facility] INVASIVE CV LAB;  Service: Cardiovascular;  Laterality: Bilateral;    Family History  Problem Relation Age of Onset   Diabetes Mother    Hypertension Mother    Cancer Father        throat   Diabetes Sister    Kidney disease Sister    Diabetes Brother    Kidney disease Brother    Breast cancer Neg Hx     Allergies  Allergen Reactions   Hydrochlorothiazide Palpitations    Jittery       Latest Ref Rng & Units 01/13/2023    3:58 AM 01/12/2023    3:40 AM 01/11/2023    3:55 AM  CBC  WBC 4.0 - 10.5 K/uL 5.5  5.0  4.6   Hemoglobin 12.0 - 15.0 g/dL 16.1  09.6  04.5   Hematocrit 36.0 -  46.0 % 34.9  38.0  38.7   Platelets 150 - 400 K/uL 220  229  229       CMP     Component  Value Date/Time   NA 139 01/13/2023 0358   NA 140 09/30/2022 0953   K 3.7 01/13/2023 0358   CL 108 01/13/2023 0358   CO2 24 01/13/2023 0358   GLUCOSE 97 01/13/2023 0358   BUN 14 01/13/2023 0358   BUN 14 09/30/2022 0953   CREATININE 0.58 01/13/2023 0358   CALCIUM 8.5 (L) 01/13/2023 0358   PROT 7.7 01/09/2023 1245   PROT 6.8 09/30/2022 0953   ALBUMIN 4.0 01/09/2023 1245   ALBUMIN 4.0 09/30/2022 0953   AST 16 01/09/2023 1245   ALT 18 01/09/2023 1245   ALKPHOS 56 01/09/2023 1245   BILITOT 0.6 01/09/2023 1245   BILITOT 0.3 09/30/2022 0953   EGFR 94 09/30/2022 0953   GFRNONAA >60 01/13/2023 0358     No results found.     Assessment & Plan:   1. Bilateral pulmonary embolism (HCC) The patient is doing very well post pulmonary embolism without significant symptoms.  The patient is advised to continue on Eliquis for 1 year.  Will plan on having the patient return to discuss anticoagulation options after 1 year.  2. Primary hypertension Continue antihypertensive medications as already ordered, these medications have been reviewed and there are no changes at this time.  3. Mixed hyperlipidemia Continue statin as ordered and reviewed, no changes at this time  4. Acute deep vein thrombosis (DVT) of left peroneal vein (HCC) We discussed postphlebitic symptoms and how to prevent this.  Patient is recommended to utilize medical grade compression stockings to be worn daily.  She is specifically advised to wear them during the day and not to sleep in these.  She is also advised to elevate her lower extremities when possible.  The patient does have issues with arthritis and gets injections in her left knee.  This will not be an issue as long as she can continue with her anticoagulation.  I have discussed that if she needs to hold her anticoagulation for any injections that she should wait at least 3  months from her initial thrombectomy in order to hold.  Additionally if she ever requires knee surgery due to her arthritic issues the patient should have an IVC filter placed.   Current Outpatient Medications on File Prior to Visit  Medication Sig Dispense Refill   albuterol (VENTOLIN HFA) 108 (90 Base) MCG/ACT inhaler Inhale 2 puffs into the lungs every 6 (six) hours as needed for wheezing or shortness of breath. 16 g 4   amLODipine (NORVASC) 5 MG tablet Take 1 tablet (5 mg total) by mouth daily. 90 tablet 4   Cholecalciferol (VITAMIN D PO) Take 1 capsule by mouth daily.      Fe Fum-Vit C-Vit B12-FA (TRIGELS-F FORTE) CAPS capsule Take 1 capsule by mouth daily after breakfast. 90 capsule 0   levothyroxine (SYNTHROID) 100 MCG tablet Take 100 mcg by mouth daily before breakfast.     lisinopril (ZESTRIL) 5 MG tablet Take 1 tablet (5 mg total) by mouth daily. 90 tablet 4   metoprolol succinate (TOPROL-XL) 50 MG 24 hr tablet Take 1 tablet (50 mg total) by mouth daily. Take with or immediately following a meal. 90 tablet 4   montelukast (SINGULAIR) 10 MG tablet Take 1 tablet (10 mg total) by mouth at bedtime. 90 tablet 4   rosuvastatin (CRESTOR) 10 MG tablet Take 1 tablet (10 mg total) by mouth daily. 90 tablet 4   sertraline (ZOLOFT) 25 MG tablet Take 1 tablet (25 mg total) by mouth daily. 30 tablet 5  No current facility-administered medications on file prior to visit.    There are no Patient Instructions on file for this visit. No follow-ups on file.   Georgiana Spinner, NP

## 2023-03-02 ENCOUNTER — Other Ambulatory Visit: Payer: Self-pay | Admitting: Nurse Practitioner

## 2023-03-03 NOTE — Telephone Encounter (Signed)
Requested Prescriptions  Pending Prescriptions Disp Refills   sertraline (ZOLOFT) 25 MG tablet [Pharmacy Med Name: Sertraline HCl Oral Tablet 25 MG] 90 tablet 0    Sig: TAKE 1 TABLET EVERY DAY     Psychiatry:  Antidepressants - SSRI - sertraline Passed - 03/02/2023 10:19 AM      Passed - AST in normal range and within 360 days    AST  Date Value Ref Range Status  01/09/2023 16 15 - 41 U/L Final         Passed - ALT in normal range and within 360 days    ALT  Date Value Ref Range Status  01/09/2023 18 0 - 44 U/L Final         Passed - Completed PHQ-2 or PHQ-9 in the last 360 days      Passed - Valid encounter within last 6 months    Recent Outpatient Visits           5 months ago Class 2 severe obesity due to excess calories with serious comorbidity and body mass index (BMI) of 39.0 to 39.9 in adult [facility])   [facility], Corrie Dandy T, NP   7 months ago Darkening of skin   [place] [facility] Mecum, Erin E, PA-C   11 months ago Morbid obesity with BMI of 40.0-44.9, adult (HCC)   [place] [facility] [place], Corrie Dandy T, NP   1 year ago Morbid obesity with BMI of 40.0-44.9, adult (HCC)    [facility] [place], Corrie Dandy T, NP   1 year ago Primary hypertension    [facility] [place], Dorie Rank, NP

## 2023-04-15 ENCOUNTER — Other Ambulatory Visit: Payer: Self-pay | Admitting: Family Medicine

## 2023-04-15 DIAGNOSIS — M85851 Other specified disorders of bone density and structure, right thigh: Secondary | ICD-10-CM

## 2023-04-27 ENCOUNTER — Other Ambulatory Visit: Payer: Self-pay | Admitting: Family Medicine

## 2023-04-27 DIAGNOSIS — Z1231 Encounter for screening mammogram for malignant neoplasm of breast: Secondary | ICD-10-CM

## 2023-05-18 ENCOUNTER — Other Ambulatory Visit: Payer: Self-pay | Admitting: Nurse Practitioner

## 2023-05-21 NOTE — Telephone Encounter (Signed)
Requested medication (s) are due for refill today:   Yes  Requested medication (s) are on the active medication list:   Yes  Future visit scheduled:   No    She cancelled her f/u appt on 10/30/2022   Last ordered: 03/03/2023 #90, 0 refills  Unable to refill though all lab criteria are met because she cancelled her f/u appt.    Provider to review for refills.      Requested Prescriptions  Pending Prescriptions Disp Refills   sertraline (ZOLOFT) 25 MG tablet [Pharmacy Med Name: Sertraline HCl Oral Tablet 25 MG] 90 tablet 3    Sig: TAKE 1 TABLET EVERY DAY     Psychiatry:  Antidepressants - SSRI - sertraline Failed - 05/18/2023 10:20 AM      Failed - Valid encounter within last 6 months    Recent Outpatient Visits           7 months ago Class 2 severe obesity due to excess calories with serious comorbidity and body mass index (BMI) of 39.0 to 39.9 in adult [facility])   [place] [facility] [place], Dorie Rank, NP   10 months ago Darkening of skin   [place] [facility] Mecum, Erin E, PA-C   1 year ago Morbid obesity with BMI of 40.0-44.9, adult (HCC)   [place] [facility] [place], [place] T, NP   1 year ago Morbid obesity with BMI of 40.0-44.9, adult (HCC)   [place] [facility] [place], [place] T, NP   1 year ago Primary hypertension    [facility] [place], [place] T, NP              Passed - AST in normal range and within 360 days    AST  Date Value Ref Range Status  01/09/2023 16 15 - 41 U/L Final         Passed - ALT in normal range and within 360 days    ALT  Date Value Ref Range Status  01/09/2023 18 0 - 44 U/L Final         Passed - Completed PHQ-2 or PHQ-9 in the last 360 days

## 2023-06-18 ENCOUNTER — Ambulatory Visit
Admission: RE | Admit: 2023-06-18 | Discharge: 2023-06-18 | Disposition: A | Discharge status: Home / Self Care | Payer: Medicare HMO | Source: Ambulatory Visit | Attending: Family Medicine | Admitting: Family Medicine

## 2023-06-18 DIAGNOSIS — M85851 Other specified disorders of bone density and structure, right thigh: Secondary | ICD-10-CM

## 2023-06-18 DIAGNOSIS — Z1231 Encounter for screening mammogram for malignant neoplasm of breast: Secondary | ICD-10-CM | POA: Diagnosis present

## 2023-07-06 ENCOUNTER — Other Ambulatory Visit: Payer: Self-pay | Admitting: Nurse Practitioner

## 2023-07-06 NOTE — Telephone Encounter (Signed)
Pt no longer under care of prescriber Requested Prescriptions  Refused Prescriptions Disp Refills   meloxicam (MOBIC) 15 MG tablet [Pharmacy Med Name: Meloxicam Oral Tablet 15 MG] 90 tablet 3    Sig: TAKE 1 TABLET EVERY DAY     Analgesics:  COX2 Inhibitors Failed - 07/06/2023  4:15 PM      Failed - Manual Review: Labs are only required if the patient has taken medication for more than 8 weeks.      Failed - HGB in normal range and within 360 days    Hemoglobin  Date Value Ref Range Status  01/13/2023 11.8 (L) 12.0 - 15.0 g/dL Final  16/03/9603 54.0 11.1 - 15.9 g/dL Final         Failed - HCT in normal range and within 360 days    HCT  Date Value Ref Range Status  01/13/2023 34.9 (L) 36.0 - 46.0 % Final   Hematocrit  Date Value Ref Range Status  09/30/2022 40.8 34.0 - 46.6 % Final         Passed - Cr in normal range and within 360 days    Creatinine, Ser  Date Value Ref Range Status  01/13/2023 0.58 0.44 - 1.00 mg/dL Final         Passed - AST in normal range and within 360 days    AST  Date Value Ref Range Status  01/09/2023 16 15 - 41 U/L Final         Passed - ALT in normal range and within 360 days    ALT  Date Value Ref Range Status  01/09/2023 18 0 - 44 U/L Final         Passed - eGFR is 30 or above and within 360 days    GFR calc Af Amer  Date Value Ref Range Status  08/07/2020 102 >59 mL/min/1.73 Final    Comment:    **In accordance with recommendations from the NKF-ASN Task force,**   [facility] is in the process of updating its eGFR calculation to the   2021 CKD-EPI creatinine equation that estimates kidney function   without a race variable.    GFR, Estimated  Date Value Ref Range Status  01/13/2023 >60 >60 mL/min Final    Comment:    (NOTE) Calculated using the CKD-EPI Creatinine Equation (2021)    eGFR  Date Value Ref Range Status  09/30/2022 94 >59 mL/min/1.73 Final         Passed - Patient is not pregnant      Passed - Valid encounter  within last 12 months    Recent Outpatient Visits           9 months ago Class 2 severe obesity due to excess calories with serious comorbidity and body mass index (BMI) of 39.0 to 39.9 in adult [facility])   [place] [facility] [place], Dorie Rank, NP   11 months ago Darkening of skin   [place] [facility] Mecum, Erin E, PA-C   1 year ago Morbid obesity with BMI of 40.0-44.9, adult (HCC)   [place] [facility] [place], Corrie Dandy T, NP   1 year ago Morbid obesity with BMI of 40.0-44.9, adult (HCC)   [place] [facility] [place], Corrie Dandy T, NP   1 year ago Primary hypertension   [place] [facility], Dorie Rank, NP

## 2023-08-31 ENCOUNTER — Emergency Department
Admission: EM | Admit: 2023-08-31 | Discharge: 2023-08-31 | Disposition: A | Discharge status: Home / Self Care | Attending: Emergency Medicine | Admitting: Emergency Medicine

## 2023-08-31 ENCOUNTER — Emergency Department

## 2023-08-31 ENCOUNTER — Other Ambulatory Visit: Payer: Self-pay

## 2023-08-31 DIAGNOSIS — M79606 Pain in leg, unspecified: Secondary | ICD-10-CM | POA: Diagnosis not present

## 2023-08-31 DIAGNOSIS — M79605 Pain in left leg: Secondary | ICD-10-CM | POA: Diagnosis not present

## 2023-08-31 DIAGNOSIS — M79604 Pain in right leg: Secondary | ICD-10-CM | POA: Insufficient documentation

## 2023-08-31 DIAGNOSIS — I10 Essential (primary) hypertension: Secondary | ICD-10-CM | POA: Diagnosis not present

## 2023-08-31 LAB — CBC WITH DIFFERENTIAL/PLATELET
Abs Immature Granulocytes: 0.02 10*3/uL (ref 0.00–0.07)
Basophils Absolute: 0 10*3/uL (ref 0.0–0.1)
Basophils Relative: 0 %
Eosinophils Absolute: 0 10*3/uL (ref 0.0–0.5)
Eosinophils Relative: 0 %
HCT: 36.2 % (ref 36.0–46.0)
Hemoglobin: 11.5 g/dL — ABNORMAL LOW (ref 12.0–15.0)
Immature Granulocytes: 0 %
Lymphocytes Relative: 32 %
Lymphs Abs: 1.4 10*3/uL (ref 0.7–4.0)
MCH: 28.8 pg (ref 26.0–34.0)
MCHC: 31.8 g/dL (ref 30.0–36.0)
MCV: 90.5 fL (ref 80.0–100.0)
Monocytes Absolute: 0.4 10*3/uL (ref 0.1–1.0)
Monocytes Relative: 9 %
Neutro Abs: 2.6 10*3/uL (ref 1.7–7.7)
Neutrophils Relative %: 59 %
Platelets: 209 10*3/uL (ref 150–400)
RBC: 4 MIL/uL (ref 3.87–5.11)
RDW: 15.3 % (ref 11.5–15.5)
WBC: 4.5 10*3/uL (ref 4.0–10.5)
nRBC: 0 % (ref 0.0–0.2)

## 2023-08-31 LAB — COMPREHENSIVE METABOLIC PANEL
ALT: 18 U/L (ref 0–44)
AST: 14 U/L — ABNORMAL LOW (ref 15–41)
Albumin: 3.2 g/dL — ABNORMAL LOW (ref 3.5–5.0)
Alkaline Phosphatase: 37 U/L — ABNORMAL LOW (ref 38–126)
Anion gap: 7 (ref 5–15)
BUN: 13 mg/dL (ref 8–23)
CO2: 24 mmol/L (ref 22–32)
Calcium: 8.3 mg/dL — ABNORMAL LOW (ref 8.9–10.3)
Chloride: 110 mmol/L (ref 98–111)
Creatinine, Ser: 0.67 mg/dL (ref 0.44–1.00)
GFR, Estimated: >60 mL/min (ref >60)
Glucose, Bld: 89 mg/dL (ref 70–99)
Potassium: 3.7 mmol/L (ref 3.5–5.1)
Sodium: 141 mmol/L (ref 135–145)
Total Bilirubin: 0.6 mg/dL (ref 0.0–1.2)
Total Protein: 6.5 g/dL (ref 6.5–8.1)

## 2023-08-31 LAB — MAGNESIUM: Magnesium: 2.1 mg/dL (ref 1.7–2.4)

## 2023-08-31 NOTE — ED Provider Notes (Signed)
 [facility] Provider Note    Event Date/Time   First MD Initiated Contact with Patient 08/31/23 [phone]     (approximate)   History   Leg Pain   HPI  Jessica Brooks is a 73 year old female with history of HTN, DVT presenting to the emergency department for evaluation of leg pain.  Earlier today, patient had onset of shooting pains in her legs.  Reports that they last for a few seconds and have been in different areas of her leg bilaterally.  Did think that her right leg may be slightly swollen earlier, but reports that this is improved.  No recent increased exertion. Is on Eliquis for DVT last year.  Denies chest pain or shortness of breath.  No back pain, numbness, tingling, focal weakness.      Physical Exam   Triage Vital Signs: ED Triage Vitals  Encounter Vitals Group     BP 08/31/23 0503 (!) 156/89     Systolic BP Percentile --      Diastolic BP Percentile --      Pulse Rate 08/31/23 0501 86     Resp 08/31/23 0501 18     Temp 08/31/23 0500 97.9 F (36.6 C)     Temp Source 08/31/23 0500 Oral     SpO2 08/31/23 0501 98 %     Weight 08/31/23 0500 255 lb 4.8 oz (115.8 kg)     Height 08/31/23 0500 5\' 5"  (1.651 m)     Head Circumference --      Peak Flow --      Pain Score --      Pain Loc --      Pain Education --      Exclude from Growth Chart --     Most recent vital signs: Vitals:   08/31/23 0600 08/31/23 0630  BP: (!) 141/80 (!) 140/118  Pulse: 74 78  Resp: 19   Temp:    SpO2: 98% 100%     General: Awake, interactive  CV:  Regular rate, good peripheral perfusion.  Resp:  Unlabored respirations.  Abd:  Nondistended.  Neuro:  Symmetric facial movement, fluid speech MSK:  2+ DP pulses bilaterally.  Freely ranging extremities without significant discomfort.  No significant tenderness to palpation.  No erythema, warmth.   ED Results / Procedures / Treatments   Labs (all labs ordered are listed, but only abnormal results are  displayed) Labs Reviewed  CBC WITH DIFFERENTIAL/PLATELET - Abnormal; Notable for the following components:      Result Value   Hemoglobin 11.5 (*)    All other components within normal limits  COMPREHENSIVE METABOLIC PANEL - Abnormal; Notable for the following components:   Calcium 8.3 (*)    Albumin 3.2 (*)    AST 14 (*)    Alkaline Phosphatase 37 (*)    All other components within normal limits  MAGNESIUM     EKG EKG independently reviewed interpreted by myself (ER attending) demonstrates:    RADIOLOGY Imaging independently reviewed and interpreted by myself demonstrates:    PROCEDURES:  Critical Care performed: No  Procedures   MEDICATIONS ORDERED IN ED: Medications - No data to display   IMPRESSION / MDM / ASSESSMENT AND PLAN / ED COURSE  I reviewed the triage vital signs and the nursing notes.  Differential diagnosis includes, but is not limited to, electrolyte abnormality, recurrent DVT, muscle strain  Patient's presentation is most consistent with acute presentation with potential threat to life or bodily  function.  73 year old female presenting to the emergency department for evaluation of leg pain.  Stable vitals on presentation.  No cardiorespiratory symptoms.  No evidence of neurovascular compromise.  With history, will obtain ultrasound of bilateral lower extremities, check basic labs including electrolytes.  Labs reassuring including electrolytes.  Ultrasound fortunately without evidence of DVT bilaterally.  Patient reassessed.  Remains feeling improved.  She is comfortable discharge home.  Strict return precautions provided.      FINAL CLINICAL IMPRESSION(S) / ED DIAGNOSES   Final diagnoses:  Bilateral leg pain     Rx / DC Orders   ED Discharge Orders     None        Note:  This document was prepared using Dragon voice recognition software and may include unintentional dictation errors.   Trinna Post, MD 08/31/23 [phone]

## 2023-08-31 NOTE — ED Triage Notes (Signed)
 Pt arrives via ems with c/o bilateral leg pain that began today. Hx of DVT and is currently on blood thinners. Pt able to ambulate from stretcher to bed. Denies SOB and CP.

## 2023-08-31 NOTE — Discharge Instructions (Signed)
 You are seen in the ER for your leg pain.  Your labs and ultrasound are reassuring.  Follow-up your primary care doctor for further evaluation.  Continue to take your medication as directed.  Return to the ER for new or worsening symptoms.

## 2023-08-31 NOTE — ED Notes (Signed)
 This RN introduced self to pt. Call light in reach, bed wheels locked, side rail raised, pt updated on plan of care. Rounding completed. MD bedside assessing pt.

## 2023-10-15 DIAGNOSIS — M170 Bilateral primary osteoarthritis of knee: Secondary | ICD-10-CM | POA: Diagnosis not present

## 2023-10-19 DIAGNOSIS — Z961 Presence of intraocular lens: Secondary | ICD-10-CM | POA: Diagnosis not present

## 2023-10-19 DIAGNOSIS — H353131 Nonexudative age-related macular degeneration, bilateral, early dry stage: Secondary | ICD-10-CM | POA: Diagnosis not present

## 2023-10-19 DIAGNOSIS — H2511 Age-related nuclear cataract, right eye: Secondary | ICD-10-CM | POA: Diagnosis not present

## 2023-10-19 DIAGNOSIS — H40003 Preglaucoma, unspecified, bilateral: Secondary | ICD-10-CM | POA: Diagnosis not present

## 2023-11-08 DIAGNOSIS — E785 Hyperlipidemia, unspecified: Secondary | ICD-10-CM | POA: Diagnosis not present

## 2023-11-08 DIAGNOSIS — Z8249 Family history of ischemic heart disease and other diseases of the circulatory system: Secondary | ICD-10-CM | POA: Diagnosis not present

## 2023-11-08 DIAGNOSIS — Z823 Family history of stroke: Secondary | ICD-10-CM | POA: Diagnosis not present

## 2023-11-08 DIAGNOSIS — Z7901 Long term (current) use of anticoagulants: Secondary | ICD-10-CM | POA: Diagnosis not present

## 2023-11-08 DIAGNOSIS — Z8673 Personal history of transient ischemic attack (TIA), and cerebral infarction without residual deficits: Secondary | ICD-10-CM | POA: Diagnosis not present

## 2023-11-08 DIAGNOSIS — Z833 Family history of diabetes mellitus: Secondary | ICD-10-CM | POA: Diagnosis not present

## 2023-11-08 DIAGNOSIS — Z87891 Personal history of nicotine dependence: Secondary | ICD-10-CM | POA: Diagnosis not present

## 2023-11-08 DIAGNOSIS — I10 Essential (primary) hypertension: Secondary | ICD-10-CM | POA: Diagnosis not present

## 2023-11-08 DIAGNOSIS — Z008 Encounter for other general examination: Secondary | ICD-10-CM | POA: Diagnosis not present

## 2023-11-08 DIAGNOSIS — J45909 Unspecified asthma, uncomplicated: Secondary | ICD-10-CM | POA: Diagnosis not present

## 2023-11-08 DIAGNOSIS — E039 Hypothyroidism, unspecified: Secondary | ICD-10-CM | POA: Diagnosis not present

## 2023-11-08 DIAGNOSIS — Z809 Family history of malignant neoplasm, unspecified: Secondary | ICD-10-CM | POA: Diagnosis not present

## 2023-11-20 ENCOUNTER — Other Ambulatory Visit: Payer: Self-pay

## 2023-11-20 ENCOUNTER — Emergency Department
Admission: EM | Admit: 2023-11-20 | Discharge: 2023-11-20 | Disposition: A | Discharge status: Home / Self Care | Attending: Emergency Medicine | Admitting: Emergency Medicine

## 2023-11-20 DIAGNOSIS — R112 Nausea with vomiting, unspecified: Secondary | ICD-10-CM | POA: Diagnosis not present

## 2023-11-20 DIAGNOSIS — I10 Essential (primary) hypertension: Secondary | ICD-10-CM | POA: Diagnosis not present

## 2023-11-20 DIAGNOSIS — J45909 Unspecified asthma, uncomplicated: Secondary | ICD-10-CM | POA: Diagnosis not present

## 2023-11-20 DIAGNOSIS — R42 Dizziness and giddiness: Secondary | ICD-10-CM | POA: Insufficient documentation

## 2023-11-20 LAB — CBC
HCT: 38.8 % (ref 36.0–46.0)
Hemoglobin: 12.1 g/dL (ref 12.0–15.0)
MCH: 27.6 pg (ref 26.0–34.0)
MCHC: 31.2 g/dL (ref 30.0–36.0)
MCV: 88.4 fL (ref 80.0–100.0)
Platelets: 237 10*3/uL (ref 150–400)
RBC: 4.39 MIL/uL (ref 3.87–5.11)
RDW: 15.2 % (ref 11.5–15.5)
WBC: 4 10*3/uL (ref 4.0–10.5)
nRBC: 0 % (ref 0.0–0.2)

## 2023-11-20 LAB — COMPREHENSIVE METABOLIC PANEL WITH GFR
ALT: 15 U/L (ref 0–44)
AST: 16 U/L (ref 15–41)
Albumin: 3.4 g/dL — ABNORMAL LOW (ref 3.5–5.0)
Alkaline Phosphatase: 42 U/L (ref 38–126)
Anion gap: 7 (ref 5–15)
BUN: 12 mg/dL (ref 8–23)
CO2: 25 mmol/L (ref 22–32)
Calcium: 8.8 mg/dL — ABNORMAL LOW (ref 8.9–10.3)
Chloride: 106 mmol/L (ref 98–111)
Creatinine, Ser: 0.75 mg/dL (ref 0.44–1.00)
GFR, Estimated: >60 mL/min (ref >60)
Glucose, Bld: 108 mg/dL — ABNORMAL HIGH (ref 70–99)
Potassium: 3.9 mmol/L (ref 3.5–5.1)
Sodium: 138 mmol/L (ref 135–145)
Total Bilirubin: 0.6 mg/dL (ref 0.0–1.2)
Total Protein: 6.5 g/dL (ref 6.5–8.1)

## 2023-11-20 MED ORDER — ONDANSETRON 8 MG PO TBDP: 8.0000 mg | ORAL_TABLET | Freq: Three times a day (TID) | ORAL | 0 refills | Status: AC | PRN

## 2023-11-20 MED ORDER — ONDANSETRON 8 MG PO TBDP
8.0000 mg | ORAL_TABLET | Freq: Once | ORAL | Status: AC
  Administered 2023-11-20: 8 mg via ORAL
  Filled 2023-11-20: qty 1

## 2023-11-20 NOTE — ED Triage Notes (Signed)
 Pt to ED via POV from home. Pt ambulatory to triage. Pt reports Tuesday started to feel unwell. Pt reports dizziness, N/V and hot flashes.

## 2023-11-20 NOTE — ED Provider Notes (Signed)
 [facility] ([facility]) Provider Note   Event Date/Time   First MD Initiated Contact with Patient 11/20/23 1112     (approximate) History  Dizziness  HPI Jessica Brooks is a 73 y.o. female with a past medical history of hypertension hyperlipidemia, and asthma who presents complaining of lightheadedness, nausea/vomiting, and hot flashes that been present over the last 48 hours.  Patient denies any abdominal pain.  Patient denies any recent travel, sick contacts, or food out of the ordinary. ROS: Patient currently denies any vision changes, tinnitus, difficulty speaking, facial droop, sore throat, chest pain, shortness of breath, abdominal pain, diarrhea, dysuria, or weakness/numbness/paresthesias in any extremity   Physical Exam  Triage Vital Signs: ED Triage Vitals  Encounter Vitals Group     BP 11/20/23 0959 134/73     Systolic BP Percentile --      Diastolic BP Percentile --      Pulse Rate 11/20/23 0959 93     Resp 11/20/23 0959 20     Temp 11/20/23 0959 98.5 F (36.9 C)     Temp Source 11/20/23 0959 Oral     SpO2 11/20/23 0959 98 %     Weight 11/20/23 1114 255 lb 4.7 oz (115.8 kg)     Height 11/20/23 1114 5\' 5"  (1.651 m)     Head Circumference --      Peak Flow --      Pain Score 11/20/23 0959 0     Pain Loc --      Pain Education --      Exclude from Growth Chart --    Most recent vital signs: Vitals:   11/20/23 0959 11/20/23 1133  BP: 134/73   Pulse: 93   Resp: 20   Temp: 98.5 F (36.9 C)   SpO2: 98% 98%   General: Awake, oriented x4. CV:  Good peripheral perfusion. Resp:  Normal effort. Abd:  No distention. Other:  Elderly obese African-American female resting comfortably in no acute distress ED Results / Procedures / Treatments  Labs (all labs ordered are listed, but only abnormal results are displayed) Labs Reviewed  COMPREHENSIVE METABOLIC PANEL WITH GFR - Abnormal; Notable for the following components:      Result Value   Glucose,  Bld 108 (*)    Calcium  8.8 (*)    Albumin 3.4 (*)    All other components within normal limits  CBC  URINALYSIS, ROUTINE W REFLEX MICROSCOPIC   EKG ED ECG REPORT I, Charleen Conn, the attending physician, personally viewed and interpreted this ECG. Date: 11/20/2023 EKG Time: 1005 Rate: 91 Rhythm: normal sinus rhythm QRS Axis: normal Intervals: normal ST/T Wave abnormalities: normal Narrative Interpretation: no evidence of acute ischemia PROCEDURES: Critical Care performed: No Procedures MEDICATIONS ORDERED IN ED: Medications  ondansetron  (ZOFRAN -ODT) disintegrating tablet 8 mg (8 mg Oral Given 11/20/23 1133)   IMPRESSION / MDM / ASSESSMENT AND PLAN / ED COURSE  I reviewed the triage vital signs and the nursing notes.                             The patient is on the cardiac monitor to evaluate for evidence of arrhythmia and/or significant heart rate changes. Patient's presentation is most consistent with acute presentation with potential threat to life or bodily function. Patient presents for acute nausea/vomiting The cause of the patient's symptoms is not clear, but the patient is overall well appearing and is suspected to  have a transient course of illness.  Given History and Exam there does not appear to be an emergent cause of the symptoms such as small bowel obstruction, coronary syndrome, bowel ischemia, DKA, pancreatitis, appendicitis, other acute abdomen or other emergent problem.  Reassessment: After treatment, the patient is feeling much better, tolerating PO fluids, and shows no signs of dehydration.   Disposition: Discharge home with prompt primary care physician follow up in the next 48 hours. Strict return precautions discussed.   FINAL CLINICAL IMPRESSION(S) / ED DIAGNOSES   Final diagnoses:  Lightheadedness  Nausea and vomiting, unspecified vomiting type   Rx / DC Orders   ED Discharge Orders          Ordered    ondansetron  (ZOFRAN -ODT) 8 MG  disintegrating tablet  Every 8 hours PRN        11/20/23 1225           Note:  This document was prepared using Dragon voice recognition software and may include unintentional dictation errors.   Makayla Confer K, MD 11/20/23 [phone]

## 2023-11-20 NOTE — ED Notes (Signed)
 Pt able to keep water down after po challenge

## 2023-12-26 ENCOUNTER — Other Ambulatory Visit: Payer: Self-pay

## 2023-12-26 ENCOUNTER — Emergency Department
Admission: EM | Admit: 2023-12-26 | Discharge: 2023-12-26 | Disposition: A | Discharge status: Home / Self Care | Attending: Emergency Medicine | Admitting: Emergency Medicine

## 2023-12-26 DIAGNOSIS — I10 Essential (primary) hypertension: Secondary | ICD-10-CM | POA: Insufficient documentation

## 2023-12-26 DIAGNOSIS — R350 Frequency of micturition: Secondary | ICD-10-CM | POA: Diagnosis not present

## 2023-12-26 DIAGNOSIS — D72829 Elevated white blood cell count, unspecified: Secondary | ICD-10-CM | POA: Diagnosis not present

## 2023-12-26 DIAGNOSIS — R42 Dizziness and giddiness: Secondary | ICD-10-CM | POA: Diagnosis present

## 2023-12-26 DIAGNOSIS — Z7901 Long term (current) use of anticoagulants: Secondary | ICD-10-CM | POA: Diagnosis not present

## 2023-12-26 DIAGNOSIS — R002 Palpitations: Secondary | ICD-10-CM | POA: Diagnosis not present

## 2023-12-26 DIAGNOSIS — Z86711 Personal history of pulmonary embolism: Secondary | ICD-10-CM | POA: Insufficient documentation

## 2023-12-26 LAB — COMPREHENSIVE METABOLIC PANEL WITH GFR
ALT: 13 U/L (ref 0–44)
AST: 14 U/L — ABNORMAL LOW (ref 15–41)
Albumin: 3.5 g/dL (ref 3.5–5.0)
Alkaline Phosphatase: 48 U/L (ref 38–126)
Anion gap: 9 (ref 5–15)
BUN: 15 mg/dL (ref 8–23)
CO2: 26 mmol/L (ref 22–32)
Calcium: 8.9 mg/dL (ref 8.9–10.3)
Chloride: 106 mmol/L (ref 98–111)
Creatinine, Ser: 0.43 mg/dL — ABNORMAL LOW (ref 0.44–1.00)
GFR, Estimated: >60 mL/min (ref >60)
Glucose, Bld: 95 mg/dL (ref 70–99)
Potassium: 3.6 mmol/L (ref 3.5–5.1)
Sodium: 141 mmol/L (ref 135–145)
Total Bilirubin: 0.4 mg/dL (ref 0.0–1.2)
Total Protein: 7.3 g/dL (ref 6.5–8.1)

## 2023-12-26 LAB — URINALYSIS, ROUTINE W REFLEX MICROSCOPIC
Bilirubin Urine: NEGATIVE
Glucose, UA: NEGATIVE mg/dL
Hgb urine dipstick: NEGATIVE
Ketones, ur: NEGATIVE mg/dL
Leukocytes,Ua: NEGATIVE
Nitrite: NEGATIVE
Protein, ur: NEGATIVE mg/dL
Specific Gravity, Urine: 1.005 (ref 1.005–1.030)
pH: 8 (ref 5.0–8.0)

## 2023-12-26 LAB — CBC
HCT: 39.6 % (ref 36.0–46.0)
Hemoglobin: 12.4 g/dL (ref 12.0–15.0)
MCH: 27.6 pg (ref 26.0–34.0)
MCHC: 31.3 g/dL (ref 30.0–36.0)
MCV: 88.2 fL (ref 80.0–100.0)
Platelets: 238 K/uL (ref 150–400)
RBC: 4.49 MIL/uL (ref 3.87–5.11)
RDW: 15.7 % — ABNORMAL HIGH (ref 11.5–15.5)
WBC: 3.8 K/uL — ABNORMAL LOW (ref 4.0–10.5)
nRBC: 0 % (ref 0.0–0.2)

## 2023-12-26 LAB — BRAIN NATRIURETIC PEPTIDE: B Natriuretic Peptide: 22.2 pg/mL (ref 0.0–100.0)

## 2023-12-26 LAB — TROPONIN I (HIGH SENSITIVITY): Troponin I (High Sensitivity): 3 ng/L (ref <18)

## 2023-12-26 NOTE — ED Provider Notes (Signed)
 [facility] Provider Note    Event Date/Time   First MD Initiated Contact with Patient 12/26/23 1201     (approximate)   History   Dizziness   HPI Jessica Brooks is a 73 y.o. female with history of HTN, PE on Eliquis , HLD presenting today for lightheadedness.  Patient states since yesterday she has had 2 episodes of lightheadedness that occur when standing.  She notes occasional palpitations, increased urinary frequency.  She reports she was bit by a bug on her left thumb but has been feeling well otherwise related to that.  Feels like she has been eating and drinking well.  Denies any chest pain, cough, shortness of breath, abdominal pain, nausea, vomiting, constipation, diarrhea.  No true syncopal episodes.  Denies any numbness or weakness anywhere.  No room spinning or dizzy sensation.  Chart review: Patient was seen in the ED proximately 1 month ago for very similar symptoms and at that time had overall reassuring workup.     Physical Exam   Triage Vital Signs: ED Triage Vitals  Encounter Vitals Group     BP 12/26/23 1117 (!) 167/94     Girls Systolic BP Percentile --      Girls Diastolic BP Percentile --      Boys Systolic BP Percentile --      Boys Diastolic BP Percentile --      Pulse Rate 12/26/23 1117 91     Resp 12/26/23 1117 20     Temp 12/26/23 1116 98.1 F (36.7 C)     Temp Source 12/26/23 1116 Oral     SpO2 12/26/23 1117 100 %     Weight --      Height --      Head Circumference --      Peak Flow --      Pain Score 12/26/23 1116 0     Pain Loc --      Pain Education --      Exclude from Growth Chart --     Most recent vital signs: Vitals:   12/26/23 1116 12/26/23 1117  BP:  (!) 167/94  Pulse:  91  Resp:  20  Temp: 98.1 F (36.7 C)   SpO2:  100%   I have reviewed the vital signs. General:  Awake, alert, no acute distress. Head:  Normocephalic, Atraumatic. EENT:  PERRL, EOMI, Oral mucosa pink and moist, Neck is  supple. Cardiovascular: Regular rate, 2+ distal pulses.  No murmur, rubs, gallops. Respiratory:  Normal respiratory effort, symmetrical expansion, no distress.   Abdomen: Soft, nontender, nondistended Extremities:  Moving all four extremities through full ROM without pain.   Neuro:  Alert and oriented.  Interacting appropriately.   Skin:  Warm, dry, no rash.   Psych: Appropriate affect.    ED Results / Procedures / Treatments   Labs (all labs ordered are listed, but only abnormal results are displayed) Labs Reviewed  COMPREHENSIVE METABOLIC PANEL WITH GFR - Abnormal; Notable for the following components:      Result Value   Creatinine, Ser 0.43 (*)    AST 14 (*)    All other components within normal limits  CBC - Abnormal; Notable for the following components:   WBC 3.8 (*)    RDW 15.7 (*)    All other components within normal limits  URINALYSIS, ROUTINE W REFLEX MICROSCOPIC - Abnormal; Notable for the following components:   Color, Urine COLORLESS (*)    APPearance CLEAR (*)  All other components within normal limits  BRAIN NATRIURETIC PEPTIDE  CBG MONITORING, ED  TROPONIN I (HIGH SENSITIVITY)     EKG My EKG interpretation: Rate of 89, normal sinus rhythm, normal axis, normal intervals.  No acute ST elevations or depressions   RADIOLOGY    PROCEDURES:  Critical Care performed: No  Procedures   MEDICATIONS ORDERED IN ED: Medications - No data to display   IMPRESSION / MDM / ASSESSMENT AND PLAN / ED COURSE  I reviewed the triage vital signs and the nursing notes.                              Differential diagnosis includes, but is not limited to, orthostatic hypotension, dehydration, electrolyte abnormality, cardiac arrhythmia, UTI, viral infection  Patient's presentation is most consistent with acute complicated illness / injury requiring diagnostic workup.  Patient is a 73 year old female presenting today for 2 episodes of lightheadedness, polyuria,  and palpitations.  Her exam at this time is completely reassuring with unremarkable neurological exam and otherwise asymptomatic.  EKG unremarkable with no ischemic findings or unusual arrhythmias.  UA negative.  Laboratory workup with CBC shows mild leukocytosis but not too dissimilar from her baseline.  CMP, troponin, and BNP all negative.  Reassessed patient still asymptomatic at this time.  I do recommend she follow-up with her PCP even though no life-threatening causes are seen at this time.  This is her second visit in a month for similar symptoms.  She prefers outpatient follow-up and is agreeable with plan.  Otherwise stable for discharge and given strict return precautions.     FINAL CLINICAL IMPRESSION(S) / ED DIAGNOSES   Final diagnoses:  Lightheadedness     Rx / DC Orders   ED Discharge Orders     None        Note:  This document was prepared using Dragon voice recognition software and may include unintentional dictation errors.   Malvina Alm DASEN, MD 12/26/23 1253

## 2023-12-26 NOTE — ED Triage Notes (Signed)
 Pt to ED via POV from home. Pt reports got bit by a bug yesterday on left thumb and reports since has been feeling dizzy, hot, increased urinary frequency and generally feeling unwell. Pt is on blood thinner. Denies falls. Pt reports eating and drinking like normal. Pt A&OX4 on arrival and ambulatory with cane.

## 2023-12-26 NOTE — Discharge Instructions (Signed)
 Your laboratory workup was reassuring today with rather unremarkable EKG of the electrical rhythm of your heart.  Kidney function electrolytes look normal.  No sign of infection in your urine and markers for your heart are reassuring.  Given a repeat episode of symptoms he had 1 month ago, I do recommend you follow-up with your primary care provider for ongoing outpatient monitoring.  If you have episodes of fully passing out or other concerning features, please return to the emergency department.

## 2024-01-22 DIAGNOSIS — M170 Bilateral primary osteoarthritis of knee: Secondary | ICD-10-CM | POA: Diagnosis not present

## 2024-02-19 ENCOUNTER — Encounter (INDEPENDENT_AMBULATORY_CARE_PROVIDER_SITE_OTHER): Payer: Self-pay | Admitting: Vascular Surgery

## 2024-02-19 ENCOUNTER — Ambulatory Visit (INDEPENDENT_AMBULATORY_CARE_PROVIDER_SITE_OTHER): Payer: Medicare HMO | Admitting: Vascular Surgery

## 2024-02-19 VITALS — BP 141/83 | HR 84 | Ht 65.0 in | Wt 250.0 lb

## 2024-02-19 DIAGNOSIS — I10 Essential (primary) hypertension: Secondary | ICD-10-CM | POA: Diagnosis not present

## 2024-02-19 DIAGNOSIS — I2699 Other pulmonary embolism without acute cor pulmonale: Secondary | ICD-10-CM

## 2024-02-19 DIAGNOSIS — E782 Mixed hyperlipidemia: Secondary | ICD-10-CM

## 2024-02-19 MED ORDER — APIXABAN 5 MG PO TABS: 5.0000 mg | ORAL_TABLET | Freq: Two times a day (BID) | ORAL | 11 refills | Status: AC

## 2024-02-19 NOTE — Assessment & Plan Note (Signed)
 lipid control important in reducing the progression of atherosclerotic disease. Continue statin therapy

## 2024-02-19 NOTE — Assessment & Plan Note (Signed)
 blood pressure control important in reducing the progression of atherosclerotic disease. On appropriate oral medications.

## 2024-02-19 NOTE — Assessment & Plan Note (Signed)
 The patient is 1 year status post pulmonary thrombectomy for submassive pulmonary embolus with right heart strain.  She is done quite well.  She is very anxious about coming off of anticoagulation which is very reasonable giving her large life-threatening pulmonary embolus a year ago.  She has tolerated anticoagulation well and we will continue this indefinitely.  I will see her back in 1 year.

## 2024-02-19 NOTE — Progress Notes (Signed)
 MRN : [id number]  Jessica Brooks is a 73 y.o. (03/03/1951) female who presents with chief complaint of  Chief Complaint  Patient presents with   Follow-up    1 year follow up no studies   .  History of Present Illness: Patient returns today in follow up of her pulmonary embolus.  About 1 year ago, she underwent pulmonary thrombectomy for symptomatic bilateral pulmonary emboli.  She has done well and remained on 5 mg of Eliquis  twice daily for the past year.  She is very concerned about recurrent blood clots and has had multiple trips to the ER for evaluation whenever she has had leg pain or chest pain.  These have been unrevealing.  She is otherwise doing well.  She has had no bleeding on the blood thinners.  She has no current chest pain or shortness of breath.  She has mild occasional leg swelling but nothing severe.  Current Outpatient Medications  Medication Sig Dispense Refill   albuterol  (VENTOLIN  HFA) 108 (90 Base) MCG/ACT inhaler Inhale 2 puffs into the lungs every 6 (six) hours as needed for wheezing or shortness of breath. 16 g 4   amLODipine  (NORVASC ) 5 MG tablet Take 1 tablet (5 mg total) by mouth daily. 90 tablet 4   apixaban  (ELIQUIS ) 5 MG TABS tablet Take 1 tablet (5 mg total) by mouth 2 (two) times daily. Start after finishing starter pack 180 tablet 3   apixaban  (ELIQUIS ) 5 MG TABS tablet Take 1 tablet (5 mg total) by mouth 2 (two) times daily. 60 tablet 11   Cholecalciferol (VITAMIN D  PO) Take 1 capsule by mouth daily.      levothyroxine  (SYNTHROID ) 100 MCG tablet Take 100 mcg by mouth daily before breakfast.     lisinopril  (ZESTRIL ) 5 MG tablet Take 1 tablet (5 mg total) by mouth daily. 90 tablet 4   metoprolol  succinate (TOPROL -XL) 50 MG 24 hr tablet Take 1 tablet (50 mg total) by mouth daily. Take with or immediately following a meal. 90 tablet 4   montelukast  (SINGULAIR ) 10 MG tablet Take 1 tablet (10 mg total) by mouth at bedtime. 90 tablet 4   rosuvastatin   (CRESTOR ) 10 MG tablet Take 1 tablet (10 mg total) by mouth daily. 90 tablet 4   sertraline  (ZOLOFT ) 25 MG tablet TAKE 1 TABLET EVERY DAY 90 tablet 3   Fe Fum-Vit C-Vit B12-FA (TRIGELS-F FORTE) CAPS capsule Take 1 capsule by mouth daily after breakfast. (Patient not taking: Reported on 02/19/2024) 90 capsule 0   ondansetron  (ZOFRAN -ODT) 8 MG disintegrating tablet Take 1 tablet (8 mg total) by mouth every 8 (eight) hours as needed for nausea or vomiting. (Patient not taking: Reported on 02/19/2024) 20 tablet 0   No current facility-administered medications for this visit.    Past Medical History:  Diagnosis Date   Arthritis    Asthma    uses inhaler   Cataract    Hyperlipidemia    Hypertension    Keloid     Past Surgical History:  Procedure Laterality Date   ABDOMINAL HYSTERECTOMY     BREAST BIOPSY Right    needle bx years ago unsure of date   BREAST BIOPSY Right    another bx done by [facility]    CATARACT EXTRACTION W/PHACO Left 06/11/2017   Procedure: CATARACT EXTRACTION PHACO AND INTRAOCULAR LENS PLACEMENT (IOC)-LEFT;  Surgeon: Mittie Gaskin, MD;  Location: [facility] ORS;  Service: Ophthalmology;  Laterality: Left;  LOT # [id number] H US  :34.4 AP 15.3% CDE  5.29   COLONOSCOPY WITH PROPOFOL  N/A 10/17/2021   Procedure: COLONOSCOPY WITH PROPOFOL ;  Surgeon: Therisa Bi, MD;  Location: [facility];  Service: Gastroenterology;  Laterality: N/A;   KELOID EXCISION N/A    KELOID REMOVAL TO NAVAL IN LATE 1980's   MULTIPLE TOOTH EXTRACTIONS     PULMONARY THROMBECTOMY Bilateral 01/12/2023   Procedure: PULMONARY THROMBECTOMY;  Surgeon: Marea Selinda RAMAN, MD;  Location: [facility] INVASIVE CV LAB;  Service: Cardiovascular;  Laterality: Bilateral;     Social History   Tobacco Use   Smoking status: Former    Current packs/day: 0.00    Types: Cigarettes    Quit date: 06/16/1978    Years since quitting: 45.7   Smokeless tobacco: Never  Vaping Use   Vaping status: Never Used  Substance Use Topics    Alcohol use: No   Drug use: No      Family History  Problem Relation Age of Onset   Diabetes Mother    Hypertension Mother    Cancer Father        throat   Diabetes Sister    Kidney disease Sister    Diabetes Brother    Kidney disease Brother    Breast cancer Neg Hx      Allergies  Allergen Reactions   Hydrochlorothiazide  Palpitations    Jittery     REVIEW OF SYSTEMS (Negative unless checked)  Constitutional: [] Weight loss  [] Fever  [] Chills Cardiac: [] Chest pain   [] Chest pressure   [] Palpitations   [] Shortness of breath when laying flat   [] Shortness of breath at rest   [] Shortness of breath with exertion. Vascular:  [] Pain in legs with walking   [] Pain in legs at rest   [] Pain in legs when laying flat   [] Claudication   [] Pain in feet when walking  [] Pain in feet at rest  [] Pain in feet when laying flat   [x] History of DVT   [] Phlebitis   [x] Swelling in legs   [] Varicose veins   [] Non-healing ulcers Pulmonary:   [] Uses home oxygen   [] Productive cough   [] Hemoptysis   [] Wheeze  [] COPD   [x] Asthma Neurologic:  [] Dizziness  [] Blackouts   [] Seizures   [] History of stroke   [] History of TIA  [] Aphasia   [] Temporary blindness   [] Dysphagia   [] Weakness or numbness in arms   [] Weakness or numbness in legs Musculoskeletal:  [x] Arthritis   [] Joint swelling   [] Joint pain   [] Low back pain Hematologic:  [] Easy bruising  [] Easy bleeding   [] Hypercoagulable state   [] Anemic   Gastrointestinal:  [] Blood in stool   [] Vomiting blood  [] Gastroesophageal reflux/heartburn   [] Abdominal pain Genitourinary:  [] Chronic kidney disease   [] Difficult urination  [] Frequent urination  [] Burning with urination   [] Hematuria Skin:  [] Rashes   [] Ulcers   [] Wounds Psychological:  [] History of anxiety   []  History of major depression.  Physical Examination  BP (!) 141/83   Pulse 84   Ht 5' 5 (1.651 m)   Wt 250 lb (113.4 kg)   BMI 41.60 kg/m  Gen:  WD/WN, NAD Head: [place]/AT, No temporalis  wasting. Ear/Nose/Throat: Hearing grossly intact, nares w/o erythema or drainage Eyes: Conjunctiva clear. Sclera non-icteric Neck: Supple.  Trachea midline Pulmonary:  Good air movement, no use of accessory muscles.  Cardiac: RRR, no JVD Vascular:  Vessel Right Left  Radial Palpable Palpable           Musculoskeletal: M/S 5/5 throughout.  No deformity or atrophy. Trace LE edema.  Neurologic: Sensation grossly intact in extremities.  Symmetrical.  Speech is fluent.  Psychiatric: Judgment intact, Mood & affect appropriate for pt's clinical situation. Dermatologic: No rashes or ulcers noted.  No cellulitis or open wounds.      Labs Recent Results (from the past 2160 hours)  Urinalysis, Routine w reflex microscopic -Urine, Clean Catch     Status: Abnormal   Collection Time: 12/26/23 11:18 AM  Result Value Ref Range   Color, Urine COLORLESS (A) YELLOW   APPearance CLEAR (A) CLEAR   Specific Gravity, Urine 1.005 1.005 - 1.030   pH 8.0 5.0 - 8.0   Glucose, UA NEGATIVE NEGATIVE mg/dL   Hgb urine dipstick NEGATIVE NEGATIVE   Bilirubin Urine NEGATIVE NEGATIVE   Ketones, ur NEGATIVE NEGATIVE mg/dL   Protein, ur NEGATIVE NEGATIVE mg/dL   Nitrite NEGATIVE NEGATIVE   Leukocytes,Ua NEGATIVE NEGATIVE    Comment: Performed at [facility], [address] [place], [place] [address]  Comprehensive metabolic panel     Status: Abnormal   Collection Time: 12/26/23 11:45 AM  Result Value Ref Range   Sodium 141 135 - 145 mmol/L   Potassium 3.6 3.5 - 5.1 mmol/L   Chloride 106 98 - 111 mmol/L   CO2 26 22 - 32 mmol/L   Glucose, Bld 95 70 - 99 mg/dL    Comment: Glucose reference range applies only to samples taken after fasting for at least 8 hours.   BUN 15 8 - 23 mg/dL   Creatinine, Ser 9.56 (L) 0.44 - 1.00 mg/dL   Calcium  8.9 8.9 - 10.3 mg/dL   Total Protein 7.3 6.5 - 8.1 g/dL   Albumin 3.5 3.5 - 5.0 g/dL   AST 14 (L) 15 - 41 U/L   ALT 13 0 - 44 U/L   Alkaline Phosphatase 48 38  - 126 U/L   Total Bilirubin 0.4 0.0 - 1.2 mg/dL   GFR, Estimated >39 >39 mL/min    Comment: (NOTE) Calculated using the CKD-EPI Creatinine Equation (2021)    Anion gap 9 5 - 15    Comment: Performed at [facility], [address] [place], [place] [address]  CBC     Status: Abnormal   Collection Time: 12/26/23 11:45 AM  Result Value Ref Range   WBC 3.8 (L) 4.0 - 10.5 K/uL   RBC 4.49 3.87 - 5.11 MIL/uL   Hemoglobin 12.4 12.0 - 15.0 g/dL   HCT 60.3 63.9 - 53.9 %   MCV 88.2 80.0 - 100.0 fL   MCH 27.6 26.0 - 34.0 pg   MCHC 31.3 30.0 - 36.0 g/dL   RDW 84.2 (H) 88.4 - 84.4 %   Platelets 238 150 - 400 K/uL   nRBC 0.0 0.0 - 0.2 %    Comment: Performed at [facility], [address], [place], [place] [address]  Troponin I (High Sensitivity)     Status: None   Collection Time: 12/26/23 11:45 AM  Result Value Ref Range   Troponin I (High Sensitivity) 3 <18 ng/L    Comment: (NOTE) Elevated high sensitivity troponin I (hsTnI) values and significant  changes across serial measurements may suggest ACS but many other  chronic and acute conditions are known to elevate hsTnI results.  Refer to the Links section for chest pain algorithms and additional  guidance. Performed at [facility], [address] [place], [place] [address]   Brain natriuretic peptide     Status: None   Collection Time: 12/26/23 11:45 AM  Result Value Ref Range   B Natriuretic Peptide 22.2 0.0 - 100.0 pg/mL    Comment: Performed at [facility], [address] [place], [place] [address]    Radiology No results found.  Assessment/Plan  Bilateral pulmonary embolism (HCC) The patient is 1 year status post pulmonary thrombectomy for submassive pulmonary embolus with right heart strain.  She is done quite well.  She is very anxious about coming off of anticoagulation which is very reasonable giving her large life-threatening pulmonary embolus a year ago.  She has tolerated  anticoagulation well and we will continue this indefinitely.  I will see her back in 1 year.  Hypertension blood pressure control important in reducing the progression of atherosclerotic disease. On appropriate oral medications.   Mixed hyperlipidemia lipid control important in reducing the progression of atherosclerotic disease. Continue statin therapy    Selinda Gu, MD  02/19/2024 11:29 AM    This note was created with Dragon medical transcription system.  Any errors from dictation are purely unintentional

## 2024-04-07 DIAGNOSIS — E890 Postprocedural hypothyroidism: Secondary | ICD-10-CM | POA: Diagnosis not present

## 2024-04-07 DIAGNOSIS — E66813 Obesity, class 3: Secondary | ICD-10-CM | POA: Diagnosis not present

## 2024-04-07 DIAGNOSIS — Z1331 Encounter for screening for depression: Secondary | ICD-10-CM | POA: Diagnosis not present

## 2024-04-13 DIAGNOSIS — E66813 Obesity, class 3: Secondary | ICD-10-CM | POA: Diagnosis not present

## 2024-04-13 DIAGNOSIS — M810 Age-related osteoporosis without current pathological fracture: Secondary | ICD-10-CM | POA: Diagnosis not present

## 2024-04-13 DIAGNOSIS — M85851 Other specified disorders of bone density and structure, right thigh: Secondary | ICD-10-CM | POA: Diagnosis not present

## 2024-04-18 DIAGNOSIS — H2511 Age-related nuclear cataract, right eye: Secondary | ICD-10-CM | POA: Diagnosis not present

## 2024-04-18 DIAGNOSIS — Z961 Presence of intraocular lens: Secondary | ICD-10-CM | POA: Diagnosis not present

## 2024-04-18 DIAGNOSIS — H353131 Nonexudative age-related macular degeneration, bilateral, early dry stage: Secondary | ICD-10-CM | POA: Diagnosis not present

## 2024-04-18 DIAGNOSIS — H40003 Preglaucoma, unspecified, bilateral: Secondary | ICD-10-CM | POA: Diagnosis not present

## 2024-04-26 DIAGNOSIS — M170 Bilateral primary osteoarthritis of knee: Secondary | ICD-10-CM | POA: Diagnosis not present

## 2024-05-04 DIAGNOSIS — M810 Age-related osteoporosis without current pathological fracture: Secondary | ICD-10-CM | POA: Diagnosis not present

## 2024-05-31 ENCOUNTER — Other Ambulatory Visit: Payer: Self-pay | Admitting: Nurse Practitioner

## 2024-05-31 DIAGNOSIS — Z1231 Encounter for screening mammogram for malignant neoplasm of breast: Secondary | ICD-10-CM

## 2024-07-05 ENCOUNTER — Encounter

## 2024-07-18 ENCOUNTER — Encounter: Payer: Self-pay | Admitting: *Deleted

## 2024-07-18 NOTE — Progress Notes (Signed)
 Jessica Brooks                                          MRN: [id number]   07/18/2024   The [facility] Quality Team Specialist reviewed this patient medical record for the purposes of chart review for care gap closure. The following were reviewed: chart review for care gap closure-controlling blood pressure.    [facility] Quality Team

## 2025-02-17 ENCOUNTER — Ambulatory Visit (INDEPENDENT_AMBULATORY_CARE_PROVIDER_SITE_OTHER): Admitting: Nurse Practitioner
